# Patient Record
Sex: Female | Born: 1955 | Race: Black or African American | Hispanic: No | Smoking: Former smoker
Health system: Southern US, Community
[De-identification: ages and names within clinical notes are randomized; demographics above are authoritative.]

## PROBLEM LIST (undated history)

## (undated) DIAGNOSIS — D126 Benign neoplasm of colon, unspecified: Secondary | ICD-10-CM

## (undated) DIAGNOSIS — M12819 Other specific arthropathies, not elsewhere classified, unspecified shoulder: Secondary | ICD-10-CM

## (undated) DIAGNOSIS — R112 Nausea with vomiting, unspecified: Secondary | ICD-10-CM

## (undated) DIAGNOSIS — J302 Other seasonal allergic rhinitis: Secondary | ICD-10-CM

## (undated) DIAGNOSIS — E0500 Thyrotoxicosis with diffuse goiter without thyrotoxic crisis or storm: Secondary | ICD-10-CM

## (undated) DIAGNOSIS — T7840XA Allergy, unspecified, initial encounter: Secondary | ICD-10-CM

## (undated) DIAGNOSIS — M19019 Primary osteoarthritis, unspecified shoulder: Secondary | ICD-10-CM

## (undated) DIAGNOSIS — M75100 Unspecified rotator cuff tear or rupture of unspecified shoulder, not specified as traumatic: Secondary | ICD-10-CM

## (undated) DIAGNOSIS — J439 Emphysema, unspecified: Secondary | ICD-10-CM

## (undated) DIAGNOSIS — D249 Benign neoplasm of unspecified breast: Secondary | ICD-10-CM

## (undated) DIAGNOSIS — R001 Bradycardia, unspecified: Secondary | ICD-10-CM

## (undated) DIAGNOSIS — M1990 Unspecified osteoarthritis, unspecified site: Secondary | ICD-10-CM

## (undated) DIAGNOSIS — K5792 Diverticulitis of intestine, part unspecified, without perforation or abscess without bleeding: Secondary | ICD-10-CM

## (undated) HISTORY — PX: TUBAL LIGATION: SHX77

## (undated) HISTORY — PX: EYE SURGERY: SHX253

## (undated) HISTORY — DX: Other specific arthropathies, not elsewhere classified, unspecified shoulder: M12.819

## (undated) HISTORY — PX: SHOULDER ARTHROSCOPY W/ SUPERIOR LABRAL ANTERIOR POSTERIOR LESION REPAIR: SHX2402

## (undated) HISTORY — DX: Other seasonal allergic rhinitis: J30.2

## (undated) HISTORY — DX: Unspecified osteoarthritis, unspecified site: M19.90

## (undated) HISTORY — DX: Benign neoplasm of unspecified breast: D24.9

## (undated) HISTORY — DX: Emphysema, unspecified: J43.9

## (undated) HISTORY — PX: MULTIPLE TOOTH EXTRACTIONS: SHX2053

## (undated) HISTORY — DX: Diverticulitis of intestine, part unspecified, without perforation or abscess without bleeding: K57.92

## (undated) HISTORY — DX: Unspecified rotator cuff tear or rupture of unspecified shoulder, not specified as traumatic: M75.100

## (undated) HISTORY — DX: Primary osteoarthritis, unspecified shoulder: M19.019

## (undated) HISTORY — DX: Allergy, unspecified, initial encounter: T78.40XA

## (undated) HISTORY — PX: BREAST EXCISIONAL BIOPSY: SUR124

## (undated) HISTORY — DX: Benign neoplasm of colon, unspecified: D12.6

## (undated) HISTORY — PX: ROTATOR CUFF REPAIR: SHX139

## (undated) HISTORY — DX: Bradycardia, unspecified: R00.1

## (undated) HISTORY — PX: OTHER SURGICAL HISTORY: SHX169

---

## 1987-03-10 HISTORY — PX: CHOLECYSTECTOMY: SHX55

## 1987-03-10 HISTORY — PX: ABDOMINAL HYSTERECTOMY: SHX81

## 1997-09-22 ENCOUNTER — Emergency Department (HOSPITAL_COMMUNITY): Admission: EM | Admit: 1997-09-22 | Discharge: 1997-09-22 | Payer: Self-pay | Admitting: Emergency Medicine

## 1998-08-01 ENCOUNTER — Emergency Department (HOSPITAL_COMMUNITY): Admission: EM | Admit: 1998-08-01 | Discharge: 1998-08-01 | Payer: Self-pay

## 1999-07-17 ENCOUNTER — Encounter: Payer: Self-pay | Admitting: Family Medicine

## 1999-07-17 ENCOUNTER — Ambulatory Visit (HOSPITAL_COMMUNITY): Admission: RE | Admit: 1999-07-17 | Discharge: 1999-07-17 | Payer: Self-pay | Admitting: Family Medicine

## 2001-06-10 ENCOUNTER — Emergency Department (HOSPITAL_COMMUNITY): Admission: EM | Admit: 2001-06-10 | Discharge: 2001-06-10 | Payer: Self-pay | Admitting: Emergency Medicine

## 2003-11-06 ENCOUNTER — Emergency Department (HOSPITAL_COMMUNITY): Admission: EM | Admit: 2003-11-06 | Discharge: 2003-11-06 | Payer: Self-pay | Admitting: Emergency Medicine

## 2008-09-25 ENCOUNTER — Emergency Department (HOSPITAL_COMMUNITY): Admission: EM | Admit: 2008-09-25 | Discharge: 2008-09-25 | Payer: Self-pay | Admitting: Family Medicine

## 2008-11-20 ENCOUNTER — Other Ambulatory Visit: Admission: RE | Admit: 2008-11-20 | Discharge: 2008-11-20 | Payer: Self-pay | Admitting: Family Medicine

## 2008-11-26 ENCOUNTER — Encounter: Admission: RE | Admit: 2008-11-26 | Discharge: 2008-11-26 | Payer: Self-pay | Admitting: Internal Medicine

## 2008-12-03 ENCOUNTER — Encounter: Admission: RE | Admit: 2008-12-03 | Discharge: 2008-12-03 | Payer: Self-pay | Admitting: Internal Medicine

## 2010-03-09 DIAGNOSIS — K5792 Diverticulitis of intestine, part unspecified, without perforation or abscess without bleeding: Secondary | ICD-10-CM

## 2010-03-09 HISTORY — DX: Diverticulitis of intestine, part unspecified, without perforation or abscess without bleeding: K57.92

## 2011-04-20 ENCOUNTER — Emergency Department (INDEPENDENT_AMBULATORY_CARE_PROVIDER_SITE_OTHER)
Admission: EM | Admit: 2011-04-20 | Discharge: 2011-04-20 | Disposition: A | Discharge status: Home / Self Care | Payer: Self-pay | Source: Home / Self Care | Attending: Emergency Medicine | Admitting: Emergency Medicine

## 2011-04-20 ENCOUNTER — Emergency Department (HOSPITAL_COMMUNITY)
Admission: RE | Admit: 2011-04-20 | Discharge: 2011-04-20 | Disposition: A | Discharge status: Home / Self Care | Payer: Self-pay | Source: Intra-hospital | Attending: Emergency Medicine | Admitting: Emergency Medicine

## 2011-04-20 ENCOUNTER — Encounter (HOSPITAL_COMMUNITY): Payer: Self-pay | Admitting: *Deleted

## 2011-04-20 DIAGNOSIS — M949 Disorder of cartilage, unspecified: Secondary | ICD-10-CM | POA: Insufficient documentation

## 2011-04-20 DIAGNOSIS — M79609 Pain in unspecified limb: Secondary | ICD-10-CM | POA: Insufficient documentation

## 2011-04-20 DIAGNOSIS — G5760 Lesion of plantar nerve, unspecified lower limb: Secondary | ICD-10-CM

## 2011-04-20 DIAGNOSIS — M899 Disorder of bone, unspecified: Secondary | ICD-10-CM | POA: Insufficient documentation

## 2011-04-20 MED ORDER — METHYLPREDNISOLONE ACETATE 40 MG/ML IJ SUSP
INTRAMUSCULAR | Status: AC
  Filled 2011-04-20: qty 5

## 2011-04-20 NOTE — ED Provider Notes (Signed)
Chief Complaint  Patient presents with  . Toe Pain    History of Present Illness:  Mrs. Zuba has had a six-month history of pain in the left second and third toes. This seems to radiate towards the web space between the 2 toes. She has swelling at the tips of the toes. The pain is worse if she wears high heels or stockings. There is some numbness in the toes. There is no redness or inflammation. She denies any trauma to the area. She does have a history of osteopenia and is supposed to be taing calcium and vitamin D, but she's not taking it. She denies any pain in the right foot, muscle weakness, ankle swelling, or any other joint pain.  Review of Systems:  Other than noted above, the patient denies any of the following symptoms: Systemic:  No fevers, chills, sweats, or aches.  No fatigue or tiredness. Musculoskeletal:  No joint pain, arthritis, bursitis, swelling, back pain, or neck pain. Neurological:  No muscular weakness, paresthesias, headache, or trouble with speech or coordination.  No dizziness.   PMFSH:  Past medical history, family history, social history, meds, and allergies were reviewed.  Physical Exam:   Vital signs:  BP 122/85  Pulse 60  Temp 98.2 F (36.8 C)  Resp 18  SpO2 100% Gen:  Alert and oriented times 3.  In no distress. Musculoskeletal: Exam of the foot reveals no swelling, redness, or tenderness to palpation over any of the toes is tenderness to palpation the webspace between the second and third toes, but no swelling of the foot. Otherwise, all joints had a full a ROM with no swelling, bruising or deformity.  No edema, pulses full. Extremities were warm and pink.  Capillary refill was brisk.  Skin:  Clear, warm and dry.  No rash. Neuro:  Alert and oriented times 3.  Muscle strength was normal.  Sensation was intact to light touch.   Labs:  No results found for this or any previous visit.   Radiology:  Dg Foot Complete Left  04/20/2011  *RADIOLOGY REPORT*   Clinical Data: Intermittent pain in the left second and third toes for the past 6 months.  No known injury.  LEFT FOOT - COMPLETE 3+ VIEW  Comparison: None.  Findings: Diffuse osteopenia.  No fracture, dislocation or periosteal reaction seen.  Minimal calcaneal spur formation.  IMPRESSION: Diffuse osteopenia.  No acute abnormality.  Original Report Authenticated By: Darrol Angel, M.D.    Course in Urgent Care Center:   Written informed consent was obtained, the identity of the patient was confirmed verbally and by checking the wrist band, and the webspace between the second and third toes at the level of the metatarsal heads was injected as follows: The area was prepped with Betadine and alcohol and anesthetized with chloride spray. The area was injected with 1 cc Depo-Medrol 40 mg strength and 1 cc of 2% Xylocaine without epinephrine using a 29-gauge 1/2 inch needle. The patient tolerated the procedure well without any immediate complications. She was given aftercare instructions.   Assessment:   Diagnoses that have been ruled out:  None  Diagnoses that are still under consideration:  None  Final diagnoses:  Morton's neuroma    Plan:   1.  The following meds were prescribed:   New Prescriptions   No medications on file   2.  The patient was instructed in symptomatic care, including rest and activity, elevation, application of ice and compression.  Appropriate handouts were  given. 3.  The patient was told to return if becoming worse in any way, if no better in 3 or 4 days, and given some red flag symptoms that would indicate earlier return.   4.  The patient was told to follow up with Dr. Cristie Hem if no improvement in one week.   Roque Lias, MD 04/20/11 1110

## 2011-04-20 NOTE — ED Notes (Signed)
Pt is here with complaints of 6 month history of left foot second and third toe pain.  Pt states pain is worse when wearing heels and pantyhose.

## 2011-09-22 ENCOUNTER — Other Ambulatory Visit: Payer: Self-pay | Admitting: Family Medicine

## 2011-09-22 DIAGNOSIS — N63 Unspecified lump in unspecified breast: Secondary | ICD-10-CM

## 2011-09-25 ENCOUNTER — Ambulatory Visit
Admission: RE | Admit: 2011-09-25 | Discharge: 2011-09-25 | Disposition: A | Discharge status: Home / Self Care | Payer: No Typology Code available for payment source | Source: Ambulatory Visit | Attending: Family Medicine | Admitting: Family Medicine

## 2011-09-25 DIAGNOSIS — N63 Unspecified lump in unspecified breast: Secondary | ICD-10-CM

## 2011-10-15 ENCOUNTER — Other Ambulatory Visit: Payer: Self-pay | Admitting: Family Medicine

## 2011-10-15 DIAGNOSIS — N63 Unspecified lump in unspecified breast: Secondary | ICD-10-CM

## 2011-10-22 ENCOUNTER — Ambulatory Visit
Admission: RE | Admit: 2011-10-22 | Discharge: 2011-10-22 | Disposition: A | Discharge status: Home / Self Care | Payer: No Typology Code available for payment source | Source: Ambulatory Visit | Attending: Family Medicine | Admitting: Family Medicine

## 2011-10-22 ENCOUNTER — Other Ambulatory Visit: Payer: Self-pay | Admitting: Family Medicine

## 2011-10-22 DIAGNOSIS — N63 Unspecified lump in unspecified breast: Secondary | ICD-10-CM

## 2011-11-11 ENCOUNTER — Encounter (HOSPITAL_COMMUNITY): Payer: Self-pay | Admitting: Emergency Medicine

## 2011-11-11 ENCOUNTER — Emergency Department (HOSPITAL_COMMUNITY): Payer: Self-pay

## 2011-11-11 ENCOUNTER — Emergency Department (HOSPITAL_COMMUNITY)
Admission: EM | Admit: 2011-11-11 | Discharge: 2011-11-11 | Disposition: A | Discharge status: Home / Self Care | Payer: Self-pay | Attending: Emergency Medicine | Admitting: Emergency Medicine

## 2011-11-11 DIAGNOSIS — R001 Bradycardia, unspecified: Secondary | ICD-10-CM

## 2011-11-11 DIAGNOSIS — Z79899 Other long term (current) drug therapy: Secondary | ICD-10-CM | POA: Insufficient documentation

## 2011-11-11 DIAGNOSIS — I498 Other specified cardiac arrhythmias: Secondary | ICD-10-CM | POA: Insufficient documentation

## 2011-11-11 DIAGNOSIS — E0500 Thyrotoxicosis with diffuse goiter without thyrotoxic crisis or storm: Secondary | ICD-10-CM | POA: Insufficient documentation

## 2011-11-11 DIAGNOSIS — R079 Chest pain, unspecified: Secondary | ICD-10-CM | POA: Insufficient documentation

## 2011-11-11 DIAGNOSIS — R002 Palpitations: Secondary | ICD-10-CM | POA: Insufficient documentation

## 2011-11-11 DIAGNOSIS — F17200 Nicotine dependence, unspecified, uncomplicated: Secondary | ICD-10-CM | POA: Insufficient documentation

## 2011-11-11 DIAGNOSIS — R42 Dizziness and giddiness: Secondary | ICD-10-CM | POA: Insufficient documentation

## 2011-11-11 HISTORY — DX: Thyrotoxicosis with diffuse goiter without thyrotoxic crisis or storm: E05.00

## 2011-11-11 LAB — CBC WITH DIFFERENTIAL/PLATELET
Basophils Relative: 0 % (ref 0–1)
Eosinophils Absolute: 0.1 10*3/uL (ref 0.0–0.7)
MCH: 30 pg (ref 26.0–34.0)
MCHC: 33.8 g/dL (ref 30.0–36.0)
Neutro Abs: 2.5 10*3/uL (ref 1.7–7.7)
Neutrophils Relative %: 40 % — ABNORMAL LOW (ref 43–77)
Platelets: 265 10*3/uL (ref 150–400)
RBC: 4.06 MIL/uL (ref 3.87–5.11)

## 2011-11-11 LAB — POCT I-STAT TROPONIN I
Troponin i, poc: 0 ng/mL (ref 0.00–0.08)
Troponin i, poc: 0 ng/mL (ref 0.00–0.08)

## 2011-11-11 LAB — BASIC METABOLIC PANEL
Chloride: 109 mEq/L (ref 96–112)
GFR calc Af Amer: >90 mL/min (ref >90)
GFR calc non Af Amer: >90 mL/min (ref >90)
Potassium: 3.8 mEq/L (ref 3.5–5.1)
Sodium: 146 mEq/L — ABNORMAL HIGH (ref 135–145)

## 2011-11-11 MED ORDER — SODIUM CHLORIDE 0.9 % IV BOLUS (SEPSIS): 1000.0000 mL | Freq: Once | INTRAVENOUS | Status: DC

## 2011-11-11 NOTE — ED Notes (Signed)
Pt ambulated to restroom. 

## 2011-11-11 NOTE — ED Notes (Signed)
No needs at present,a waiting blood results to come back.

## 2011-11-11 NOTE — ED Provider Notes (Addendum)
History     CSN: [id number]  Arrival date & time 11/11/11  0129   First MD Initiated Contact with Patient 11/11/11 [phone]      Chief Complaint  Patient presents with  . Chest Pain    (Consider location/radiation/quality/duration/timing/severity/associated sxs/prior treatment) Patient is a 56 y.o. female presenting with chest pain. The history is provided by the patient.  Chest Pain Primary symptoms include palpitations. Pertinent negatives for primary symptoms include no fever, no shortness of breath, no cough, no abdominal pain, no nausea and no vomiting.  The palpitations did not occur with shortness of breath.   Pertinent negatives for associated symptoms include no diaphoresis.    56 year old, female who smokes cigarettes and has a history of Graves' disease, presents to emergency department complaining of chest pain, with lightheadedness, and palpitations of her heart.  She denies nausea, vomiting, sweating, or shortness of breath.  Presently, she is asymptomatic.  She denies cough, fevers, chills, leg pain or swelling.  She has never had similar symptoms in the past.  Past Medical History  Diagnosis Date  . Graves disease     Past Surgical History  Procedure Date  . Shoulder arthroscopy w/ superior labral anterior posterior lesion repair right  . Abdominal hysterectomy   . Cholecystectomy   . Rotator cuff repair     No family history on file.  History  Substance Use Topics  . Smoking status: Current Everyday Smoker -- 0.5 packs/day  . Smokeless tobacco: Not on file  . Alcohol Use: No    OB History    Grav Para Term Preterm Abortions TAB SAB Ect Mult Living                  Review of Systems  Constitutional: Negative for fever, chills and diaphoresis.  HENT: Negative for neck pain.   Respiratory: Negative for cough and shortness of breath.   Cardiovascular: Positive for chest pain and palpitations. Negative for leg swelling.  Gastrointestinal: Negative for  nausea, vomiting and abdominal pain.  Musculoskeletal: Negative for back pain.  Neurological: Positive for light-headedness.  Hematological: Does not bruise/bleed easily.  Psychiatric/Behavioral: Negative for confusion.  All other systems reviewed and are negative.    Allergies  Sulfur  Home Medications   Current Outpatient Rx  Name Route Sig Dispense Refill  . CALCITRATE PO Oral Take 1 tablet by mouth daily.    Marland Kitchen CETIRIZINE HCL 10 MG PO TABS Oral Take 10 mg by mouth daily as needed. For allergies    . VITAMIN D 1000 UNITS PO TABS Oral Take 1,000 Units by mouth daily.    Marland Kitchen DICLOFENAC SODIUM 1 % TD GEL Topical Apply 2 g topically daily as needed. For shoulder pain per patient    . FLUTICASONE PROPIONATE 50 MCG/ACT NA SUSP Nasal Place 1 spray into the nose 2 (two) times daily as needed. For allergies    . CENTRUM SILVER PO Oral Take 1 tablet by mouth daily.      BP 122/68  Pulse 50  Temp 98.1 F (36.7 C) (Oral)  Resp 19  SpO2 100%  Physical Exam  Nursing note and vitals reviewed. Constitutional: She is oriented to person, place, and time. She appears well-developed and well-nourished. No distress.  HENT:  Head: Normocephalic and atraumatic.  Eyes: Conjunctivae and EOM are normal. No scleral icterus.  Neck: Normal range of motion. Neck supple.  Cardiovascular: Regular rhythm and intact distal pulses.   No murmur heard.      Bradycardia  Pulmonary/Chest: Effort normal and breath sounds normal. No respiratory distress. She has no rales.  Abdominal: Soft. Bowel sounds are normal. She exhibits no distension. There is no tenderness.  Musculoskeletal: Normal range of motion. She exhibits no edema and no tenderness.  Neurological: She is alert and oriented to person, place, and time.  Skin: Skin is warm and dry.  Psychiatric: She has a normal mood and affect. Thought content normal.    ED Course  Procedures (including critical care time) 56 year old, smoker with Graves'  disease, presents emergency department complaining of chest pain, lightheadedness, and palpitations of her heart.  She denies cough, fevers, chills, sweating, shortness of breath, nausea, and vomiting.  On, examination.  She is asymptomatic now and her physical is normal except for the bradycardic rhythm.  I doubt that she has ACS, but we will perform an EKG, and laboratory testing, for evaluation  Labs Reviewed  CBC WITH DIFFERENTIAL - Abnormal; Notable for the following:    Neutrophils Relative 40 (*)     Lymphocytes Relative 50 (*)     All other components within normal limits  BASIC METABOLIC PANEL - Abnormal; Notable for the following:    Sodium 146 (*)     All other components within normal limits  POCT I-STAT TROPONIN I   Dg Chest 2 View  11/11/2011  *RADIOLOGY REPORT*  Clinical Data: Chest heaviness, shortness of breath, history Graves' disease  CHEST - 2 VIEW  Comparison: None  Findings: Normal heart size, mediastinal contours, and pulmonary vascularity. Lungs appear mildly emphysematous but clear. No pleural effusion or pneumothorax. Minimal peribronchial thickening. Question mild osseous demineralization.  IMPRESSION: Mild emphysematous and bronchitic changes question COPD. No acute abnormalities.   Original Report Authenticated By: Lollie Marrow, M.D.      No diagnosis found.  ECG. Sinus bradycardia at 47 beats per minute. Normal axis. Normal intervals  Normal.  ST and T waves   HR 39-40 on monitor.  Will cancel dc and ambulate pt  5:40 AM I spoke with Dr. Terressa Koyanagi, cards. We discussed H&P, ecg, labs, hr and paucity of sxs when ambulatory.  He says she can go home to f/u with pcp for possible holter.   i explained this to pt.  She understands and agrees with plan.   MDM  Sinus bradycardia         Cheri Guppy, MD 11/11/11 [id number]  Cheri Guppy, MD 11/11/11 8469  Cheri Guppy, MD 11/11/11 [phone]

## 2011-11-11 NOTE — ED Notes (Signed)
PT. REPORTS MID CHEST PAIN WITH SOB AND NAUSEA ONSET THIS EVENING , DENIS COUGH OR DIAPHORESIS.

## 2011-12-10 ENCOUNTER — Ambulatory Visit (INDEPENDENT_AMBULATORY_CARE_PROVIDER_SITE_OTHER): Payer: Self-pay | Admitting: General Surgery

## 2011-12-10 ENCOUNTER — Encounter (INDEPENDENT_AMBULATORY_CARE_PROVIDER_SITE_OTHER): Payer: Self-pay | Admitting: General Surgery

## 2011-12-10 VITALS — BP 94/48 | HR 56 | Temp 97.3°F | Resp 16 | Ht 64.0 in | Wt 126.4 lb

## 2011-12-10 DIAGNOSIS — N6310 Unspecified lump in the right breast, unspecified quadrant: Secondary | ICD-10-CM

## 2011-12-10 DIAGNOSIS — N63 Unspecified lump in unspecified breast: Secondary | ICD-10-CM

## 2011-12-10 NOTE — Addendum Note (Signed)
Addended by: Axel Filler on: 12/10/2011 10:03 AM   Modules accepted: Orders

## 2011-12-10 NOTE — Progress Notes (Signed)
Subjective:     Patient ID: Brandi Stevens, female   DOB: 05/29/1955, 56 y.o.   MRN: [id number]  HPI This patient is a 56-year-old female who is referred by Dr. Thacker for a right lateral breast mass. 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 The patient secondary this mass has been growing over the last several months like to have this removed at this time. The patient's tissues noticed no drainage from the nipple on the right side, no lymphadenopathy.  Review of Systems  Constitutional: Negative.   HENT: Negative.   Eyes: Negative.   Respiratory: Negative.   Cardiovascular: Negative.   Gastrointestinal: Negative.   Musculoskeletal: Negative.   Neurological: Negative.        Objective:   Physical Exam  Constitutional: She is oriented to person, place, and time. She appears well-developed and well-nourished.  HENT:  Head: Normocephalic and atraumatic.  Eyes: Conjunctivae normal and EOM are normal. Pupils are equal, round, and reactive to light.  Neck: Normal range of motion. Neck supple.  Cardiovascular: Normal rate and regular rhythm.   Pulmonary/Chest: Effort normal and breath sounds normal.         A small 1 cm lesion in the lateral chest wall approximately the 10:00 position.  Abdominal: Soft. Bowel sounds are normal.  Musculoskeletal: Normal range of motion.  Neurological: She is alert and oriented to person, place, and time.  Skin: Skin is warm and dry.       Assessment:     56-year-old female with a right lateral breast mass.    Plan:     1. We'll proceed to the operating room for removal of her right lower breast mass. 2. All risks and benefits were discussed with the patient, to generally include infection, bleeding, damage to surrounding structures, and recurrence. Alternatives were offered and described.  All questions were answered and the patient voiced understanding of the procedure and wishes to proceed at this  point.       

## 2011-12-11 ENCOUNTER — Ambulatory Visit (HOSPITAL_COMMUNITY)
Admission: RE | Admit: 2011-12-11 | Discharge: 2011-12-11 | Disposition: A | Discharge status: Home / Self Care | Payer: Self-pay | Source: Ambulatory Visit | Attending: Interventional Cardiology | Admitting: Interventional Cardiology

## 2011-12-11 DIAGNOSIS — I498 Other specified cardiac arrhythmias: Secondary | ICD-10-CM | POA: Insufficient documentation

## 2011-12-11 DIAGNOSIS — R0789 Other chest pain: Secondary | ICD-10-CM

## 2011-12-11 DIAGNOSIS — Z1389 Encounter for screening for other disorder: Secondary | ICD-10-CM | POA: Insufficient documentation

## 2011-12-14 ENCOUNTER — Encounter (HOSPITAL_COMMUNITY): Payer: Self-pay | Admitting: Pharmacy Technician

## 2011-12-15 NOTE — Pre-Procedure Instructions (Signed)
21 Brandi Stevens  12/15/2011   Your procedure is scheduled on:  Tuesday, October 15th.  Report to [facility] at 10:15 AM.  Call this number if you have problems the morning of surgery: [phone]   Remember:   Do not eat food or drink any liquid:After Midnight.     Take these medicines the morning of surgery with A SIP OF WATER: Cetirizine (Zyretec) and Tramadol (Ultram) if needed.    Do not wear jewelry, make-up or nail polish.  Do not wear lotions, powders, or perfumes. You may wear deodorant.  Do not shave 48 hours prior to surgery. Men may shave face and neck.  Do not bring valuables to the hospital.  Contacts, dentures or bridgework may not be worn into surgery.  Leave suitcase in the car. After surgery it may be brought to your room.  For patients admitted to the hospital, checkout time is 11:00 AM the day of discharge.   Patients discharged the day of surgery will not be allowed to drive home.  Name and phone number of your driver: __________________     Special Instructions: Shower using CHG 2 nights before surgery and the night before surgery.  If you shower the day of surgery use CHG.  Use special wash - you have one bottle of CHG for all showers.  You should use approximately 1/3 of the bottle for each shower.   Please read over the following fact sheets that you were given: Pain Booklet, Coughing and Deep Breathing and Surgical Site Infection Prevention

## 2011-12-16 ENCOUNTER — Encounter (HOSPITAL_COMMUNITY)
Admission: RE | Admit: 2011-12-16 | Discharge: 2011-12-16 | Disposition: A | Discharge status: Home / Self Care | Payer: Self-pay | Source: Ambulatory Visit | Attending: General Surgery | Admitting: General Surgery

## 2011-12-16 ENCOUNTER — Encounter (HOSPITAL_COMMUNITY): Payer: Self-pay

## 2011-12-16 HISTORY — DX: Nausea with vomiting, unspecified: R11.2

## 2011-12-16 LAB — CBC
HCT: 39.6 % (ref 36.0–46.0)
Hemoglobin: 13.1 g/dL (ref 12.0–15.0)
MCHC: 33.1 g/dL (ref 30.0–36.0)
RDW: 13.5 % (ref 11.5–15.5)
WBC: 4.1 10*3/uL (ref 4.0–10.5)

## 2011-12-16 LAB — BASIC METABOLIC PANEL
CO2: 27 mEq/L (ref 19–32)
Calcium: 9.9 mg/dL (ref 8.4–10.5)
Glucose, Bld: 86 mg/dL (ref 70–99)
Potassium: 4.6 mEq/L (ref 3.5–5.1)
Sodium: 143 mEq/L (ref 135–145)

## 2011-12-16 LAB — SURGICAL PCR SCREEN
MRSA, PCR: NEGATIVE
Staphylococcus aureus: NEGATIVE

## 2011-12-16 NOTE — Progress Notes (Signed)
Dr Mendel Ryder office called for holter monitor results.  strress test in epic,normal.

## 2011-12-17 NOTE — Consult Note (Addendum)
Anesthesia Chart Review:  Patient is a 56 year old female scheduled for excision of a right breast mass by Dr. Derrell Lolling on 12/22/2011. History includes former smoker, Graves' disease, asthma, arthritis, postoperative nausea/vomiting, bradycardia.    Labs noted.  Chest x-ray on 11/11/2011 showed mild emphysematous and bronchitic changes question COPD. No acute abnormalities.  EKG on 11/11/11 showed sinus bradycardia at 47 beats per minute.  She had a normal exercise treadmill stress test on 12/11/11.  Holter monitor test read on 12/16/11 by Dr. Verdis Prime showed NSR is basic rhythm, occasional PACs, heart rate range 40-178 per minute, no rate or tachycardia arrhythmias noted. Overall normal study.  Anticipate she can proceed as planned if no significant change in her status and remains asymptomatic from a CV/bradycardia standpoint.  Shonna Chock, PA-C  Addendum: 12/22/11 1720 I received additional Cardiology records following the above note and reviewed these with Anesthesiologist Dr. Michelle Piper.  Patient was seen by Dr. Katrinka Blazing due to recurrent syncope.  I left a message with him on 12/18/11 re: planned procedure.  Today I received a note from Cardiologist Dr. Katrinka Blazing that patient was cleared for this procedure.

## 2011-12-21 MED ORDER — CEFAZOLIN SODIUM-DEXTROSE 2-3 GM-% IV SOLR
2.0000 g | INTRAVENOUS | Status: AC
  Administered 2011-12-22: 2 g via INTRAVENOUS
  Filled 2011-12-21: qty 50

## 2011-12-22 ENCOUNTER — Ambulatory Visit (HOSPITAL_COMMUNITY): Payer: Self-pay | Admitting: Vascular Surgery

## 2011-12-22 ENCOUNTER — Encounter (HOSPITAL_COMMUNITY): Payer: Self-pay | Admitting: *Deleted

## 2011-12-22 ENCOUNTER — Encounter (HOSPITAL_COMMUNITY): Payer: Self-pay | Admitting: Vascular Surgery

## 2011-12-22 ENCOUNTER — Encounter (HOSPITAL_COMMUNITY)
Admission: RE | Disposition: A | Discharge status: Home / Self Care | Payer: Self-pay | Source: Ambulatory Visit | Attending: General Surgery

## 2011-12-22 ENCOUNTER — Ambulatory Visit (HOSPITAL_COMMUNITY)
Admission: RE | Admit: 2011-12-22 | Discharge: 2011-12-22 | Disposition: A | Discharge status: Home / Self Care | Payer: Self-pay | Source: Ambulatory Visit | Attending: General Surgery | Admitting: General Surgery

## 2011-12-22 DIAGNOSIS — D249 Benign neoplasm of unspecified breast: Secondary | ICD-10-CM | POA: Insufficient documentation

## 2011-12-22 DIAGNOSIS — Z01812 Encounter for preprocedural laboratory examination: Secondary | ICD-10-CM | POA: Insufficient documentation

## 2011-12-22 HISTORY — PX: BREAST BIOPSY: SHX20

## 2011-12-22 SURGERY — BREAST BIOPSY
Anesthesia: General | Site: Breast | Laterality: Right | Wound class: Clean

## 2011-12-22 MED ORDER — ONDANSETRON HCL 4 MG/2ML IJ SOLN: 4.0000 mg | Freq: Once | INTRAMUSCULAR | Status: DC | PRN

## 2011-12-22 MED ORDER — BUPIVACAINE HCL (PF) 0.25 % IJ SOLN
INTRAMUSCULAR | Status: AC
  Filled 2011-12-22: qty 30

## 2011-12-22 MED ORDER — LACTATED RINGERS IV SOLN
INTRAVENOUS | Status: DC | PRN
  Administered 2011-12-22: 12:00:00 via INTRAVENOUS

## 2011-12-22 MED ORDER — LACTATED RINGERS IV SOLN
INTRAVENOUS | Status: DC
  Administered 2011-12-22: 12:00:00 via INTRAVENOUS

## 2011-12-22 MED ORDER — PROPOFOL 10 MG/ML IV BOLUS
INTRAVENOUS | Status: DC | PRN
  Administered 2011-12-22: 140 mg via INTRAVENOUS

## 2011-12-22 MED ORDER — HYDROMORPHONE HCL PF 1 MG/ML IJ SOLN: 0.2500 mg | INTRAMUSCULAR | Status: DC | PRN

## 2011-12-22 MED ORDER — HYDROCODONE-ACETAMINOPHEN 5-500 MG PO TABS: 1.0000 | ORAL_TABLET | Freq: Four times a day (QID) | ORAL | Status: DC | PRN

## 2011-12-22 MED ORDER — LIDOCAINE HCL (PF) 1 % IJ SOLN
INTRAMUSCULAR | Status: AC
  Filled 2011-12-22: qty 30

## 2011-12-22 MED ORDER — MEPERIDINE HCL 25 MG/ML IJ SOLN: 6.2500 mg | INTRAMUSCULAR | Status: DC | PRN

## 2011-12-22 MED ORDER — 0.9 % SODIUM CHLORIDE (POUR BTL) OPTIME
TOPICAL | Status: DC | PRN
  Administered 2011-12-22: 1000 mL

## 2011-12-22 MED ORDER — FENTANYL CITRATE 0.05 MG/ML IJ SOLN
INTRAMUSCULAR | Status: DC | PRN
  Administered 2011-12-22: 50 ug via INTRAVENOUS

## 2011-12-22 MED ORDER — OXYCODONE HCL 5 MG PO TABS: 5.0000 mg | ORAL_TABLET | Freq: Once | ORAL | Status: DC | PRN

## 2011-12-22 MED ORDER — ONDANSETRON HCL 4 MG/2ML IJ SOLN
INTRAMUSCULAR | Status: DC | PRN
  Administered 2011-12-22: 4 mg via INTRAVENOUS

## 2011-12-22 MED ORDER — BUPIVACAINE HCL (PF) 0.25 % IJ SOLN
INTRAMUSCULAR | Status: DC | PRN
  Administered 2011-12-22: 30 mL

## 2011-12-22 MED ORDER — LIDOCAINE-EPINEPHRINE (PF) 1 %-1:200000 IJ SOLN
INTRAMUSCULAR | Status: AC
  Filled 2011-12-22: qty 10

## 2011-12-22 MED ORDER — MIDAZOLAM HCL 5 MG/5ML IJ SOLN
INTRAMUSCULAR | Status: DC | PRN
  Administered 2011-12-22: 2 mg via INTRAVENOUS

## 2011-12-22 MED ORDER — OXYCODONE HCL 5 MG/5ML PO SOLN: 5.0000 mg | Freq: Once | ORAL | Status: DC | PRN

## 2011-12-22 SURGICAL SUPPLY — 43 items
BINDER BREAST LRG (GAUZE/BANDAGES/DRESSINGS) IMPLANT
BINDER BREAST XLRG (GAUZE/BANDAGES/DRESSINGS) IMPLANT
BLADE SURG 10 STRL SS (BLADE) ×2 IMPLANT
BLADE SURG 15 STRL LF DISP TIS (BLADE) ×1 IMPLANT
BLADE SURG 15 STRL SS (BLADE) ×1
CHLORAPREP W/TINT 26ML (MISCELLANEOUS) ×2 IMPLANT
CLOTH BEACON ORANGE TIMEOUT ST (SAFETY) ×2 IMPLANT
COVER SURGICAL LIGHT HANDLE (MISCELLANEOUS) ×2 IMPLANT
DECANTER SPIKE VIAL GLASS SM (MISCELLANEOUS) ×2 IMPLANT
DERMABOND ADVANCED (GAUZE/BANDAGES/DRESSINGS) ×1
DERMABOND ADVANCED .7 DNX12 (GAUZE/BANDAGES/DRESSINGS) ×1 IMPLANT
DRAPE PED LAPAROTOMY (DRAPES) ×2 IMPLANT
ELECT CAUTERY BLADE 6.4 (BLADE) ×2 IMPLANT
ELECT REM PT RETURN 9FT ADLT (ELECTROSURGICAL) ×2
ELECTRODE REM PT RTRN 9FT ADLT (ELECTROSURGICAL) ×1 IMPLANT
GLOVE BIO SURGEON STRL SZ 6.5 (GLOVE) ×2 IMPLANT
GLOVE BIO SURGEON STRL SZ7 (GLOVE) ×2 IMPLANT
GLOVE BIOGEL PI IND STRL 6.5 (GLOVE) ×1 IMPLANT
GLOVE BIOGEL PI IND STRL 7.0 (GLOVE) ×1 IMPLANT
GLOVE BIOGEL PI IND STRL 7.5 (GLOVE) ×1 IMPLANT
GLOVE BIOGEL PI INDICATOR 6.5 (GLOVE) ×1
GLOVE BIOGEL PI INDICATOR 7.0 (GLOVE) ×1
GLOVE BIOGEL PI INDICATOR 7.5 (GLOVE) ×1
GLOVE ORTHOPEDIC STR SZ6.5 (GLOVE) ×2 IMPLANT
GOWN STRL NON-REIN LRG LVL3 (GOWN DISPOSABLE) ×6 IMPLANT
KIT BASIN OR (CUSTOM PROCEDURE TRAY) ×2 IMPLANT
KIT ROOM TURNOVER OR (KITS) ×2 IMPLANT
NEEDLE HYPO 25GX1X1/2 BEV (NEEDLE) ×2 IMPLANT
NS IRRIG 1000ML POUR BTL (IV SOLUTION) ×2 IMPLANT
PACK SURGICAL SETUP 50X90 (CUSTOM PROCEDURE TRAY) ×2 IMPLANT
PAD ARMBOARD 7.5X6 YLW CONV (MISCELLANEOUS) ×2 IMPLANT
PENCIL BUTTON HOLSTER BLD 10FT (ELECTRODE) ×2 IMPLANT
SPONGE LAP 4X18 X RAY DECT (DISPOSABLE) ×2 IMPLANT
SUT MNCRL AB 4-0 PS2 18 (SUTURE) ×2 IMPLANT
SUT SILK 3 0 SH 30 (SUTURE) IMPLANT
SUT VIC AB 3-0 SH 27 (SUTURE) ×1
SUT VIC AB 3-0 SH 27XBRD (SUTURE) ×1 IMPLANT
SYR BULB 3OZ (MISCELLANEOUS) ×2 IMPLANT
SYR CONTROL 10ML LL (SYRINGE) ×2 IMPLANT
TOWEL OR 17X24 6PK STRL BLUE (TOWEL DISPOSABLE) ×2 IMPLANT
TOWEL OR 17X26 10 PK STRL BLUE (TOWEL DISPOSABLE) ×2 IMPLANT
TUBE CONNECTING 12X1/4 (SUCTIONS) IMPLANT
YANKAUER SUCT BULB TIP NO VENT (SUCTIONS) ×2 IMPLANT

## 2011-12-22 NOTE — H&P (View-Only) (Signed)
Subjective:     Patient ID: Brandi Stevens, female   DOB: 1955-06-24, 56 y.o.   MRN: [id number]  HPI This patient is a 56 year old female who is referred by Dr. Abigail Miyamoto for a right lateral breast mass. 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 The patient secondary this mass has been growing over the last several months like to have this removed at this time. The patient's tissues noticed no drainage from the nipple on the right side, no lymphadenopathy.  Review of Systems  Constitutional: Negative.   HENT: Negative.   Eyes: Negative.   Respiratory: Negative.   Cardiovascular: Negative.   Gastrointestinal: Negative.   Musculoskeletal: Negative.   Neurological: Negative.        Objective:   Physical Exam  Constitutional: She is oriented to person, place, and time. She appears well-developed and well-nourished.  HENT:  Head: Normocephalic and atraumatic.  Eyes: Conjunctivae normal and EOM are normal. Pupils are equal, round, and reactive to light.  Neck: Normal range of motion. Neck supple.  Cardiovascular: Normal rate and regular rhythm.   Pulmonary/Chest: Effort normal and breath sounds normal.         A small 1 cm lesion in the lateral chest wall approximately the 10:00 position.  Abdominal: Soft. Bowel sounds are normal.  Musculoskeletal: Normal range of motion.  Neurological: She is alert and oriented to person, place, and time.  Skin: Skin is warm and dry.       Assessment:     56 year old female with a right lateral breast mass.    Plan:     1. We'll proceed to the operating room for removal of her right lower breast mass. 2. All risks and benefits were discussed with the patient, to generally include infection, bleeding, damage to surrounding structures, and recurrence. Alternatives were offered and described.  All questions were answered and the patient voiced understanding of the procedure and wishes to proceed at this  point.

## 2011-12-22 NOTE — Op Note (Signed)
Pre Operative Diagnosis:  Right breast mass  Post Operative Diagnosis: same  Procedure: Right breast mass excision  Surgeon: Dr. Axel Filler  Assistant: Dr. Jamey Ripa  Anesthesia: GETA  EBL: less than 5 cc  Complications: none  Counts: reported as correct x 2  Findings:  The patient had a proximally 1 x 1 cm mobile mass in the right lateral aspect of her breast.  Indications for procedure:  The patient is a 56 year old female who discovered a right lateral breast mass. Patient underwent needle biopsy which revealed fibrous tissue. Secondary to patient's concern and one half was removed patient have this electively removed.  Details of the procedure: after the patient was consented patient was taken back to the operating room. Patient was then placed in the supine position bilateral SCDs in place. After appropriate antibiotics were confirmed, a time out was called and all facts were verified.  Marcaine was injected just intradermally. A 2 cm incision is made in the right lateral aspect of the breast approximately at the 10:00 position. Bovie cautery was used to maintain hemostasis and dissection was carried down to the mass. The mass was grasped with an Allis hemostat and then the mass was excised circumferentially. Once it was excised it was sent off to pathology. The area was checked for hemostasis which was excellent this portion of the case. A 3-0 Vicryl suture was used in interrupted fashion to reapproximate the dermal layer. The skin was reapproximated using a 4-0 Monocryl in a subcuticular fashion.  He was then dressed with Dermabond the patient was awakened from anesthesia was taken to recovery in stable condition and

## 2011-12-22 NOTE — Anesthesia Procedure Notes (Signed)
Procedure Name: LMA Insertion Date/Time: 12/22/2011 12:15 PM Performed by: Margaree Mackintosh Pre-anesthesia Checklist: Patient identified, Emergency Drugs available, Suction available, Patient being monitored and Timeout performed Patient Re-evaluated:Patient Re-evaluated prior to inductionOxygen Delivery Method: Circle system utilized Preoxygenation: Pre-oxygenation with 100% oxygen Intubation Type: IV induction LMA: LMA inserted LMA Size: 4.0 Placement Confirmation: positive ETCO2 and breath sounds checked- equal and bilateral Tube secured with: Tape Dental Injury: Teeth and Oropharynx as per pre-operative assessment

## 2011-12-22 NOTE — Anesthesia Preprocedure Evaluation (Signed)
Anesthesia Evaluation  Patient identified by MRN, date of birth, ID band Patient awake    History of Anesthesia Complications (+) PONV  Airway Mallampati: I TM Distance: >3 FB Neck ROM: Full    Dental   Pulmonary          Cardiovascular + dysrhythmias     Neuro/Psych    GI/Hepatic   Endo/Other    Renal/GU      Musculoskeletal   Abdominal   Peds  Hematology   Anesthesia Other Findings   Reproductive/Obstetrics                           Anesthesia Physical Anesthesia Plan  ASA: II  Anesthesia Plan: General   Post-op Pain Management:    Induction: Intravenous  Airway Management Planned: LMA  Additional Equipment:   Intra-op Plan:   Post-operative Plan: Extubation in OR  Informed Consent: I have reviewed the patients History and Physical, chart, labs and discussed the procedure including the risks, benefits and alternatives for the proposed anesthesia with the patient or authorized representative who has indicated his/her understanding and acceptance.     Plan Discussed with: CRNA and Surgeon  Anesthesia Plan Comments:         Anesthesia Quick Evaluation

## 2011-12-22 NOTE — Transfer of Care (Signed)
Immediate Anesthesia Transfer of Care Note  Patient: Brandi Stevens  Procedure(s) Performed: Procedure(s) (LRB) with comments: BREAST BIOPSY (Right) - Excision right breast mass  Patient Location: PACU  Anesthesia Type: General  Level of Consciousness: awake, alert  and oriented  Airway & Oxygen Therapy: Patient Spontanous Breathing and Patient connected to nasal cannula oxygen  Post-op Assessment: Report given to PACU RN and Post -op Vital signs reviewed and stable  Post vital signs: Reviewed and stable  Complications: No apparent anesthesia complications

## 2011-12-22 NOTE — Anesthesia Postprocedure Evaluation (Signed)
Anesthesia Post Note  Patient: Brandi Stevens  Procedure(s) Performed: Procedure(s) (LRB): BREAST BIOPSY (Right)  Anesthesia type: general  Patient location: PACU  Post pain: Pain level controlled  Post assessment: Patient's Cardiovascular Status Stable  Last Vitals:  Filed Vitals:   12/22/11 1300  BP:   Pulse: 58  Temp:   Resp: 15    Post vital signs: Reviewed and stable  Level of consciousness: sedated  Complications: No apparent anesthesia complications

## 2011-12-22 NOTE — Preoperative (Signed)
Beta Blockers   Reason not to administer Beta Blockers:Not Applicable 

## 2011-12-22 NOTE — Interval H&P Note (Signed)
History and Physical Interval Note:  12/22/2011 11:46 AM  Brandi Stevens  has presented today for surgery, with the diagnosis of Right breast mass  The various methods of treatment have been discussed with the patient and family. After consideration of risks, benefits and other options for treatment, the patient has consented to  Procedure(s) (LRB) with comments: BREAST BIOPSY (Right) - Excision right breast mass as a surgical intervention .  The patient's history has been reviewed, patient examined, no change in status, stable for surgery.  I have reviewed the patient's chart and labs.  Questions were answered to the patient's satisfaction.     Marigene Ehlers., Jed Limerick

## 2011-12-23 NOTE — Progress Notes (Signed)
Pt  Asked about when to bathe ... Skin glue intact over her  Incision.  Urged to call the office triage number ... But assured  24-48 hours would be ok to have a shower .Marland Kitchen

## 2011-12-24 ENCOUNTER — Encounter (HOSPITAL_COMMUNITY): Payer: Self-pay | Admitting: General Surgery

## 2012-01-01 ENCOUNTER — Encounter (INDEPENDENT_AMBULATORY_CARE_PROVIDER_SITE_OTHER): Payer: Self-pay | Admitting: General Surgery

## 2012-01-08 DIAGNOSIS — D249 Benign neoplasm of unspecified breast: Secondary | ICD-10-CM

## 2012-01-08 HISTORY — DX: Benign neoplasm of unspecified breast: D24.9

## 2012-01-15 ENCOUNTER — Encounter (INDEPENDENT_AMBULATORY_CARE_PROVIDER_SITE_OTHER): Payer: Self-pay | Admitting: General Surgery

## 2012-01-15 ENCOUNTER — Ambulatory Visit (INDEPENDENT_AMBULATORY_CARE_PROVIDER_SITE_OTHER): Payer: Self-pay | Admitting: General Surgery

## 2012-01-15 VITALS — BP 112/72 | HR 68 | Temp 97.9°F | Resp 12 | Ht 64.0 in | Wt 128.0 lb

## 2012-01-15 DIAGNOSIS — Z9889 Other specified postprocedural states: Secondary | ICD-10-CM

## 2012-01-15 NOTE — Progress Notes (Signed)
Patient ID: Brandi Stevens, female   DOB: [birth year], 56 y.o.   MRN: [id number] The patient's 56 year old female status post right breast mass removal.  The patient has been doing well postoperatively. The wound has been clean dry and intact. 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  followup in one month  For a wound check.

## 2012-02-16 ENCOUNTER — Encounter (INDEPENDENT_AMBULATORY_CARE_PROVIDER_SITE_OTHER): Payer: Self-pay | Admitting: General Surgery

## 2012-05-27 ENCOUNTER — Encounter: Payer: Self-pay | Admitting: Internal Medicine

## 2012-05-27 ENCOUNTER — Ambulatory Visit: Payer: Self-pay

## 2012-05-27 ENCOUNTER — Ambulatory Visit (INDEPENDENT_AMBULATORY_CARE_PROVIDER_SITE_OTHER): Payer: Self-pay | Admitting: Internal Medicine

## 2012-05-27 VITALS — BP 128/71 | HR 51 | Temp 97.1°F | Ht 65.0 in | Wt 124.9 lb

## 2012-05-27 DIAGNOSIS — J309 Allergic rhinitis, unspecified: Secondary | ICD-10-CM

## 2012-05-27 DIAGNOSIS — E0500 Thyrotoxicosis with diffuse goiter without thyrotoxic crisis or storm: Secondary | ICD-10-CM

## 2012-05-27 DIAGNOSIS — H1010 Acute atopic conjunctivitis, unspecified eye: Secondary | ICD-10-CM

## 2012-05-27 DIAGNOSIS — J438 Other emphysema: Secondary | ICD-10-CM

## 2012-05-27 DIAGNOSIS — Z1211 Encounter for screening for malignant neoplasm of colon: Secondary | ICD-10-CM

## 2012-05-27 DIAGNOSIS — M12819 Other specific arthropathies, not elsewhere classified, unspecified shoulder: Secondary | ICD-10-CM

## 2012-05-27 DIAGNOSIS — I498 Other specified cardiac arrhythmias: Secondary | ICD-10-CM

## 2012-05-27 DIAGNOSIS — M949 Disorder of cartilage, unspecified: Secondary | ICD-10-CM

## 2012-05-27 DIAGNOSIS — D241 Benign neoplasm of right breast: Secondary | ICD-10-CM

## 2012-05-27 DIAGNOSIS — Z136 Encounter for screening for cardiovascular disorders: Secondary | ICD-10-CM

## 2012-05-27 DIAGNOSIS — M8580 Other specified disorders of bone density and structure, unspecified site: Secondary | ICD-10-CM

## 2012-05-27 DIAGNOSIS — R001 Bradycardia, unspecified: Secondary | ICD-10-CM

## 2012-05-27 LAB — LIPID PANEL
HDL: 64 mg/dL (ref >39)
LDL Cholesterol: 105 mg/dL — ABNORMAL HIGH (ref 0–99)
Total CHOL/HDL Ratio: 2.8 Ratio

## 2012-05-27 LAB — TSH: TSH: 1.899 u[IU]/mL (ref 0.350–4.500)

## 2012-05-27 MED ORDER — FLUTICASONE PROPIONATE 50 MCG/ACT NA SUSP: 1.0000 | Freq: Two times a day (BID) | NASAL | Status: DC | PRN

## 2012-05-27 MED ORDER — ASPIRIN EC 81 MG PO TBEC: 81.0000 mg | DELAYED_RELEASE_TABLET | Freq: Every day | ORAL | Status: AC

## 2012-05-27 MED ORDER — CROMOLYN SODIUM 4 % OP SOLN: 1.0000 [drp] | Freq: Four times a day (QID) | OPHTHALMIC | Status: DC

## 2012-05-27 NOTE — Assessment & Plan Note (Signed)
S/P RAI ablation in 2008. Not on thyroid replacement for past several years. No baseline TSH in our records. Does have complaints of constipation, decreased appetite, and fatigue. She says she feels this way when her thyroid is "out of whack." - Check TSH today - Request records [facility], Dr. Abigail Miyamoto

## 2012-05-27 NOTE — Assessment & Plan Note (Signed)
Patient met today with [facility] colon cancer outreach.

## 2012-05-27 NOTE — Patient Instructions (Addendum)
1. Please start taking a baby aspirin (81mg ) daily. You can get this over the counter. It will protect you from stroke. 2. Keep taking all of your medicines. 3. I will call you if your lab tests look abnormal 4. Otherwise come back to the clinic in 6 months

## 2012-05-27 NOTE — Progress Notes (Signed)
Patient ID: Brandi Stevens, female   DOB: 1955-04-20, 57 y.o.   MRN: [id number]  Subjective:   Patient ID: Brandi Stevens female   DOB: 20-Jun-1955 57 y.o.   MRN: [id number]  HPI: Ms.Brandi Stevens is a 57 y.o. female with history of osteopenia, bradycardia, Graves' disease status post ablation, rotator cuff arthropathy presenting to the clinic to establish care. Patient has a history of Graves' disease which is status post radioiodine ablation in 2008. She reports that she has not taken thyroid replacement hormone currently or in the past.today she says that ever the past couple of months she has felt more tired than usual and some constipation. She has loss of appetite and has not been needing as much, which she says is responsible for about a 10 pound weight loss. She says that she feels this way when her "thyroid is out of whack." She cannot remember the last time her TSH was checked. She does have a history of bradycardia, which she's had for many years. She had an exercise treadmill test done in October 2013 prior to surgical procedure for benign breast mass excision. Exercise test was normal with resting heart rate noted to be in the 40s. Patient denies any symptoms such as dizziness, chest pain, shortness of breath, palpitations, syncope. She has a history of bilateral rotator cuff arthropathy with right sided rotator cuff tear and 2 prior surgeries on her right rotator cuff. She follows with [facility] orthopedics. She says she still has significant scar tissue in her right shoulder. She's doing physical therapy exercises for pain management at home and taking Tylenol. Patient has a 20-pack-year smoking history and quit smoking one year ago. She had a one year period of abstinence, but reports that she has started back smoking a few cigarettes a day due to stress her personal life. Her mother was recently diagnosed with pancreatic cancer. She's trying to cut back and plans to quit completely on her  own. In terms of other medical problems, she says she was diagnosed with mild emphysema in the 34s. She's a 20-pack-year smoking history. She is on any current inhaler therapy. She does not have any cough or wheezing. She's noted on DEXA scan in 2010 to have osteopenia and was started on calcium and vitamin D supplements. No falls. She's up-to-date on her mammograms. Last mammogram in 2013 showed a breast mass which was excised and noted to be benign fibroadenoma. No history of breast malignancies in the family. She's had a hysterectomy in the past. She is interested in talking about colon cancer screening today. She does not have a family history of colon cancer.    Past Medical History  Diagnosis Date  . Graves disease 1980s    RAI in 2008  . Arthritis     Bilateral shoulders.  . Bradycardia     Exercise stress test normal 2008  . 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 Emphysema     Last PFTs 1980s  . Rotator cuff tear arthropathy     R cuff  . Osteoarthritis of shoulder     Bilateral arthritis/bursitis  . Diverticulitis 2012   Current Outpatient Prescriptions  Medication Sig Dispense Refill  . acetaminophen (TYLENOL) 650 MG CR tablet Take 650 mg by mouth every 8 (eight) hours as needed for pain.      . Calcium Citrate (CALCITRATE PO) Take 1 tablet by mouth daily.      . cetirizine (ZYRTEC) 10 MG tablet  Take 10 mg by mouth daily as needed. For allergies      . cholecalciferol (VITAMIN D) 1000 UNITS tablet Take 1,000 Units by mouth daily.      . cromolyn (OPTICROM) 4 % ophthalmic solution Place 1 drop into both eyes 4 (four) times daily.  10 mL  5  . diclofenac sodium (VOLTAREN) 1 % GEL Apply 2 g topically daily as needed. For shoulder pain per patient      . fluticasone (FLONASE) 50 MCG/ACT nasal spray Place 1 spray into the nose 2 (two) times daily as needed. For allergies  16 g  3  . Multiple Vitamins-Minerals (CENTRUM SILVER PO) Take 1 tablet by mouth daily.      Marland Kitchen  aspirin EC 81 MG tablet Take 1 tablet (81 mg total) by mouth daily.  150 tablet  2  . traMADol (ULTRAM) 50 MG tablet Take 50 mg by mouth every 6 (six) hours as needed.       No current facility-administered medications for this visit.   Family History  Problem Relation Age of Onset  . Pancreatic cancer Mother 61    pancreatic  . Hearing loss Father   . Stroke Father 1    ischemic stroke  . Gastric cancer Maternal Aunt     gastric  . Colon cancer Maternal Grandfather 64   History   Social History  . Marital Status: Divorced    Spouse Name: N/A    Number of Children: 3  . 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 Smoking status: Current Some Day Smoker -- 1.00 packs/day for 20 years    Start date: 11/08/2011  . Smokeless tobacco: Never Used     Comment: Had successful abstinent period of 1 year last year, restarted when mother got sick  . Alcohol Use: No  . Drug Use: No  . Sexually Active: Yes    Birth Control/ Protection: Post-menopausal     Comment: Not sexually active in year and half. History of gonorrhea/chlamydia   Other Topics Concern  . None   Social History Narrative   Live in [place], lives alone. Sons nearby.    Review of Systems: 10 pt ROS performed, pertinent positives and negatives noted in HPI Objective:  Physical Exam: Filed Vitals:   05/27/12 1027  BP: 128/71  Pulse: 51  Temp: 97.1 F (36.2 C)  TempSrc: Oral  Height: 5\' 5"  (1.651 m)  Weight: 124 lb 14.4 oz (56.654 kg)  SpO2: 100%   Constitutional: Vital signs reviewed.  Patient is a thin female in no acute distress and cooperative with exam. Alert and oriented x3.  Head: Normocephalic and atraumatic Mouth: no erythema or exudates, MMM Eyes: PERRL, EOMI, conjunctivae normal, No scleral icterus.  Neck: Supple, no thyroid enlargement/tenderness  Cardiovascular: Rate approx 55 w regular rhythm, S1 normal, S2 normal, no  MRG, pulses symmetric and intact bilaterally Pulmonary/Chest: CTAB, no wheezes, rales, or rhonchi Abdominal: Soft. Non-tender, non-distended, bowel sounds are normal, no masses, organomegaly, or guarding present.  Musculoskeletal: Limited range of motion of right shoulder secondary to pain. Difficulty with internal rotation/adduction as well as external rotation/abduction. No synovitis or joint effusions. Hematology: no cervical adenopathy or visible bruising Neurological: A&O x3, Strength is normal and symmetric bilaterally, cranial nerve II-XII are grossly intact, no focal motor deficit, sensory intact to light touch bilaterally.  Skin: Warm, dry and intact. No  rash, cyanosis, or clubbing.  Psychiatric: Normal mood and affect. Assessment & Plan:   Please see problem-based charting for assessment and plan.

## 2012-05-27 NOTE — Assessment & Plan Note (Signed)
Stable today, neurovascularly intact. Patient continues to do PT exercises at home. - Tylenol for pain

## 2012-05-27 NOTE — Assessment & Plan Note (Signed)
HR in 50s today. Patient had dx of bradycardia "for years." Unclear if related to thyroid function. ETT last fall unremarkable. No symptoms dizziness/palpitations/dyspnea/CP.  I will request records from [facility], her primary PCP.

## 2012-05-27 NOTE — Assessment & Plan Note (Signed)
T score femur -2.3 on DEXA 2010. FRAX score 2.7% for fracture in next 10 years. On calcium and vitamin D. Patient without falls.  - Continue calcium and vitamin D - Will likely need repeat DEXA scan in the next couple of years, will defer today due to cost, therapy w Ca and Vit D, and low fracture risk as evidenced by FRAX score.

## 2012-05-27 NOTE — Assessment & Plan Note (Signed)
Patient reports that she was diagnosed with "mild emphysema" in 1980s by PFTs. Has 20 pack year smoking history, now smokes a few cigarettes per day. Not on inhaler therapy.  - No active sx - Will need repeat PFTs but will defer per patients wishes due to cost and desire to prioritize colon cancer screening.

## 2012-05-27 NOTE — Assessment & Plan Note (Signed)
Controlled w flonase, zyrtec, cromolyn eye drops - Will refill flonase today

## 2012-06-02 ENCOUNTER — Other Ambulatory Visit (INDEPENDENT_AMBULATORY_CARE_PROVIDER_SITE_OTHER): Payer: Self-pay

## 2012-06-02 DIAGNOSIS — Z1211 Encounter for screening for malignant neoplasm of colon: Secondary | ICD-10-CM

## 2012-06-02 LAB — POC HEMOCCULT BLD/STL (HOME/3-CARD/SCREEN)

## 2012-08-31 ENCOUNTER — Encounter: Payer: Self-pay | Admitting: Internal Medicine

## 2012-08-31 ENCOUNTER — Ambulatory Visit (INDEPENDENT_AMBULATORY_CARE_PROVIDER_SITE_OTHER): Payer: Self-pay | Admitting: Internal Medicine

## 2012-08-31 VITALS — BP 116/75 | HR 92 | Temp 99.2°F | Ht 65.0 in | Wt 124.8 lb

## 2012-08-31 DIAGNOSIS — L293 Anogenital pruritus, unspecified: Secondary | ICD-10-CM

## 2012-08-31 DIAGNOSIS — J309 Allergic rhinitis, unspecified: Secondary | ICD-10-CM

## 2012-08-31 DIAGNOSIS — H1010 Acute atopic conjunctivitis, unspecified eye: Secondary | ICD-10-CM

## 2012-08-31 DIAGNOSIS — M7750 Other enthesopathy of unspecified foot: Secondary | ICD-10-CM

## 2012-08-31 DIAGNOSIS — I498 Other specified cardiac arrhythmias: Secondary | ICD-10-CM

## 2012-08-31 DIAGNOSIS — N898 Other specified noninflammatory disorders of vagina: Secondary | ICD-10-CM | POA: Insufficient documentation

## 2012-08-31 DIAGNOSIS — R001 Bradycardia, unspecified: Secondary | ICD-10-CM

## 2012-08-31 DIAGNOSIS — Z1211 Encounter for screening for malignant neoplasm of colon: Secondary | ICD-10-CM

## 2012-08-31 DIAGNOSIS — M7742 Metatarsalgia, left foot: Secondary | ICD-10-CM | POA: Insufficient documentation

## 2012-08-31 MED ORDER — TRIAMCINOLONE ACETONIDE(NASAL) 55 MCG/ACT NA INHA: 1.0000 | Freq: Two times a day (BID) | NASAL | Status: DC

## 2012-08-31 MED ORDER — LORATADINE 10 MG PO TABS: 10.0000 mg | ORAL_TABLET | Freq: Every day | ORAL | Status: DC

## 2012-08-31 NOTE — Assessment & Plan Note (Addendum)
Has had years of "low heart rate," previously nl ETT 2013 reviewed in EPIC, pt reports followed by holter monitor through Dr. [facility]. Results of holter are not available in EPIC. Requested records from Dr. Katrinka Blazing. At any rate, HR in 90s w RR this visit and no CP/palpitations. Last TSH at goal.  Await records.   ADDENDUM 09/02/2012 4:17 PM  48 hour holter nl

## 2012-08-31 NOTE — Assessment & Plan Note (Signed)
Could not afford flonase or zyrtec. Will Rx nasacort, loratidine.

## 2012-08-31 NOTE — Patient Instructions (Addendum)
1. We will call you about your referral appointments.  2. i have sent new prescriptions for a nasal steroid spray and an allergy tablet - should be much more affordable and help your itchy eyes. 3. I will call you if anything is abnormal on your results today.

## 2012-08-31 NOTE — Progress Notes (Signed)
Patient ID: Brandi Stevens, female   DOB: 13-Jul-1955, 57 y.o.   MRN: [id number]  Subjective:   Patient ID: Brandi Stevens female   DOB: September 28, 1955 57 y.o.   MRN: [id number]  HPI: Brandi Stevens is a 57 y.o. female with history of osteopenia, bradycardia, Graves' disease status post ablation, rotator cuff arthropathy presenting to the clinic for follow up.  She now has insurance and would like referral to GI for colonoscopy.  She has had some L and now R metatarsal foot pain. Says this is chronic and previously saw a podiatrist in [place] before moving. Last orthotic Rx given 7 yrs ago. Complaining of vaginal itching and very mild whitish discharge. No pelvic pain. Last intercourse >40yr ago. Thinks she is due for a pap smear. Is s/p hysterectomy, says she thinks she still may have a cervix.   Says was not able to afford zyrtec or flonase. Still with runny/itching eyes, much worse after mowing lawn 2 days ago.     Past Medical History  Diagnosis Date  . Graves disease 1980s    RAI in 2008  . Arthritis     Bilateral shoulders.  . Bradycardia     Exercise stress test normal 2008  . 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 Emphysema     Last PFTs 1980s, not on current inhaler thearpy  . Rotator cuff tear arthropathy     R cuff  . Osteoarthritis of shoulder     Bilateral arthritis/bursitis  . Diverticulitis 2012   Current Outpatient Prescriptions  Medication Sig Dispense Refill  . acetaminophen (TYLENOL) 650 MG CR tablet Take 650 mg by mouth every 8 (eight) hours as needed for pain.      Marland Kitchen aspirin EC 81 MG tablet Take 1 tablet (81 mg total) by mouth daily.  150 tablet  2  . Calcium Citrate (CALCITRATE PO) Take 1 tablet by mouth daily.      . cholecalciferol (VITAMIN D) 1000 UNITS tablet Take 1,000 Units by mouth daily.      . cromolyn (OPTICROM) 4 % ophthalmic solution Place 1 drop into both eyes 4 (four) times daily.  10 mL  5  . diclofenac sodium (VOLTAREN) 1 % GEL Apply 2 g  topically daily as needed. For shoulder pain per patient      . loratadine (CLARITIN) 10 MG tablet Take 1 tablet (10 mg total) by mouth daily.  30 tablet  5  . Multiple Vitamins-Minerals (CENTRUM SILVER PO) Take 1 tablet by mouth daily.      . traMADol (ULTRAM) 50 MG tablet Take 50 mg by mouth every 6 (six) hours as needed.      . triamcinolone (NASACORT AQ) 55 MCG/ACT nasal inhaler Place 1 spray into the nose 2 (two) times daily.  1 Inhaler  2   No current facility-administered medications for this visit.   Family History  Problem Relation Age of Onset  . Pancreatic cancer Mother 56    pancreatic  . Hearing loss Father   . Stroke Father 71    ischemic stroke  . Gastric cancer Maternal Aunt     gastric  . Colon cancer Maternal Grandfather 90   History   Social History  . Marital Status: Divorced    Spouse Name: N/A    Number of Children: 3  . 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 Smoking status: Former Smoker -- 1.00 packs/day for 20 years    Start date: 11/08/2011  . Smokeless tobacco: Never Used     Comment: Quit x 3 weeks.  . Alcohol Use: No  . Drug Use: No  . Sexually Active: None   Other Topics Concern  . None   Social History Narrative   Live in [place], lives alone. Sons nearby.    Review of Systems: 10 pt ROS performed, pertinent positives and negatives noted in HPI Objective:  Physical Exam: Filed Vitals:   08/31/12 1542  BP: 116/75  Pulse: 92  Temp: 99.2 F (37.3 C)  TempSrc: Oral  Height: 5\' 5"  (1.651 m)  Weight: 124 lb 12.8 oz (56.609 kg)  SpO2: 99%   Constitutional: Vital signs reviewed.  Patient is a well-developed and well-nourished femle in no acute distress and cooperative with exam. Alert and oriented x3.  Head: Normocephalic and atraumatic Eyes: PERRL, EOMI, conjunctivae slightly injected, No scleral icterus.  Cardiovascular: RRR, S1 normal, S2 normal, no MRG,  pulses symmetric and intact bilaterally Pulmonary/Chest: CTAB, no wheezes, rales, or rhonchi Abdominal: Soft. Non-tender, non-distended, bowel sounds are normal, no masses, organomegaly, or guarding present.  GU: pink mucosa of vagina without erythema, small amount of white odorless discharge. Cervix surgically absent.  Musculoskeletal: TTP w manipulation of MTP joints on 1-3rd toes of L foot. No effusions/erythema. 2+ DP pulses bilaterally Skin: Warm, dry and intact. No rash, cyanosis, or clubbing.  Psychiatric: Normal mood and affect. Assessment & Plan:   Please see problem-based charting for assessment and plan.

## 2012-08-31 NOTE — Progress Notes (Signed)
Case discussed with Dr. Ziemer at the time of the visit.  We reviewed the resident's history and exam and pertinent patient test results.  I agree with the assessment, diagnosis, and plan of care documented in the resident's note.  

## 2012-08-31 NOTE — Assessment & Plan Note (Signed)
Previously followed for this years ago by out of town podiatrist, last orthotics 7 yrs ago, requests referral to local podiatrist here. - refer to local podiatry.

## 2012-08-31 NOTE — Assessment & Plan Note (Addendum)
Scant amt white discharge, odorless. Cervix surgical absent. No vaginal erythema.  - wet prep pending  ADDENDUM 09/02/2012 12:01 PM  Wet prep + candida. Called pt, no answer. LM to call back clinic. Sent in diflucan 150mg  x1 pill to pharmacy.

## 2012-08-31 NOTE — Assessment & Plan Note (Signed)
Now has health insurance, would like screening colonoscopy. - Referral initiated.

## 2012-09-02 ENCOUNTER — Telehealth: Payer: Self-pay | Admitting: *Deleted

## 2012-09-02 MED ORDER — FLUCONAZOLE 150 MG PO TABS: 150.0000 mg | ORAL_TABLET | Freq: Once | ORAL | Status: DC

## 2012-09-02 NOTE — Telephone Encounter (Signed)
Message left on recorder to have pt contact the office for lab results/new rx.  Wet prep positive for yeast and md has written rx for diflucan 150 mg, which I have called into [facility] pharmacy on [address].Kingsley Spittle Cassady6/27/20143:32 PM

## 2012-09-02 NOTE — Addendum Note (Signed)
Addended by: Ignacia Palma on: 09/02/2012 12:01 PM   Modules accepted: Orders

## 2012-09-22 NOTE — Addendum Note (Signed)
Addended by: Hassan Buckler on: 09/22/2012 01:26 PM   Modules accepted: Orders

## 2012-11-09 ENCOUNTER — Encounter: Payer: Self-pay | Admitting: Internal Medicine

## 2012-11-14 ENCOUNTER — Ambulatory Visit: Payer: BC Managed Care – PPO | Admitting: Internal Medicine

## 2013-02-14 NOTE — Addendum Note (Signed)
Addended by: Neomia Dear on: 02/14/2013 09:29 PM   Modules accepted: Orders

## 2013-04-12 ENCOUNTER — Ambulatory Visit (INDEPENDENT_AMBULATORY_CARE_PROVIDER_SITE_OTHER): Payer: No Typology Code available for payment source | Admitting: Internal Medicine

## 2013-04-12 ENCOUNTER — Encounter: Payer: Self-pay | Admitting: Internal Medicine

## 2013-04-12 VITALS — BP 125/64 | HR 58 | Temp 98.1°F | Ht 65.0 in | Wt 133.6 lb

## 2013-04-12 DIAGNOSIS — N898 Other specified noninflammatory disorders of vagina: Secondary | ICD-10-CM

## 2013-04-12 DIAGNOSIS — Z0000 Encounter for general adult medical examination without abnormal findings: Secondary | ICD-10-CM

## 2013-04-12 DIAGNOSIS — N390 Urinary tract infection, site not specified: Secondary | ICD-10-CM

## 2013-04-12 DIAGNOSIS — B3749 Other urogenital candidiasis: Secondary | ICD-10-CM

## 2013-04-12 LAB — POCT URINALYSIS DIPSTICK
BILIRUBIN UA: NEGATIVE
Glucose, UA: NEGATIVE
KETONES UA: NEGATIVE
Nitrite, UA: 0.2
Protein, UA: NEGATIVE
SPEC GRAV UA: 1.025
Urobilinogen, UA: 0.2
pH, UA: 5.5

## 2013-04-12 MED ORDER — NITROFURANTOIN MACROCRYSTAL 100 MG PO CAPS: 100.0000 mg | ORAL_CAPSULE | Freq: Two times a day (BID) | ORAL | Status: DC

## 2013-04-12 NOTE — Progress Notes (Signed)
   Subjective:    Patient ID: Brandi Stevens, female    DOB: November 02, 1955, 58 y.o.   MRN: [id number]  HPI Comments: Brandi Stevens is a 58 year old woman with a PMH of Grave's disease (s/p RAI), osteopenia and sinus bradycardia.  She presents with 7 month complaint of vaginal odor and black discharge.  It is a small amount of discharge but she notices it daily.  Positive flank pain in last 3 weeks.  Urine is dark (tea-colored). She denies fevers/chills, vaginal itching, white discharge or dysuria.  She denies shaving or douching.  Last had intercourse about 2 years ago.  Last menstrual period 1989 (s/p hysterectomy).        Review of Systems  Constitutional: Negative for fever, chills and appetite change.  Eyes: Negative for visual disturbance.  Respiratory: Negative for shortness of breath.   Cardiovascular: Negative for chest pain.  Gastrointestinal: Negative for nausea, vomiting, abdominal pain, diarrhea and blood in stool.  Endocrine: Negative for polyuria.  Genitourinary: Positive for flank pain and vaginal discharge. Negative for dysuria, frequency, vaginal bleeding, vaginal pain and pelvic pain.  Neurological: Negative for headaches.       Objective:   Physical Exam  Vitals reviewed. Constitutional: She appears well-developed. No distress.  Cardiovascular: Normal rate, regular rhythm and normal heart sounds.   Pulmonary/Chest: Effort normal and breath sounds normal. No respiratory distress. She has no wheezes. She has no rales.  Abdominal: Soft. Bowel sounds are normal. She exhibits no distension. There is no tenderness.  Genitourinary: Pelvic exam was performed with patient supine. No labial fusion. There is lesion on the right labia. There is no rash on the right labia. There is no rash on the left labia. No erythema, tenderness or bleeding around the vagina. No foreign body around the vagina. No signs of injury around the vagina. No vaginal discharge found.  + suprapubic tenderness;  on vaginal exam there is raised, soft, non-tender nodule on the right labia.  No vesicles or ulcers noted.  On speculum exam: the cervix is surgically absent; there is a white area on the right vaginal wall.  No bleeding or discharge. On manual exam the right vaginal wall is not smooth; there is a single round "mass" jutting out from the wall.  Musculoskeletal: She exhibits no tenderness.  Skin: She is not diaphoretic.  Psychiatric: She has a normal mood and affect. Her behavior is normal.          Assessment & Plan:  Please see problem based assessment and plan.

## 2013-04-12 NOTE — Patient Instructions (Signed)
1.Please take nitrofurantoin 100mg  twice daily for 5 days.  If you do not begin to feel better in a few days please call the clinic.  2. You will be contacted with the date and time for your OBGyn appointment.   3. Please take all medications as prescribed.    4. If you have worsening of your symptoms or new symptoms arise, please call the clinic ([phone]), or go to the ER immediately if symptoms are severe.

## 2013-04-12 NOTE — Assessment & Plan Note (Addendum)
Assessment:  Vaginal odor and discharge x 7 months.  No fevers, itching or dysuria.  Last intercourse was 2 years so STD less likely.  She has had hysterectomy so this is not uterine bleeding.  She says the nodule on her labia has been present for decades but that specialist have not wanted to remove it because of its location.  She would like it removed.  The abnormalities (palpable "mass") on her vaginal exam are concerning and warrant further work-up by OBGyn.  Differential include vaginal cyst vs poly vs malignancy.  Plan:  1) wet prep for BV, candida, trichomonas, GC/CT            2) referral to OBGyn for evaluation

## 2013-04-12 NOTE — Assessment & Plan Note (Addendum)
Suprapubic pain and + UA.  Mild flank discomfort but no fevers or vomiting to suggest pyelonephritis.  Will treat with nitrofurantoin 100mg  BID x 5 days.

## 2013-04-12 NOTE — Assessment & Plan Note (Signed)
Referred for colonoscopy

## 2013-04-13 LAB — URINALYSIS, ROUTINE W REFLEX MICROSCOPIC
BILIRUBIN URINE: NEGATIVE
GLUCOSE, UA: NEGATIVE mg/dL
Hgb urine dipstick: NEGATIVE
KETONES UR: NEGATIVE mg/dL
Nitrite: NEGATIVE
Protein, ur: NEGATIVE mg/dL
SPECIFIC GRAVITY, URINE: 1.023 (ref 1.005–1.030)
UROBILINOGEN UA: 0.2 mg/dL (ref 0.0–1.0)
pH: 5.5 (ref 5.0–8.0)

## 2013-04-13 LAB — URINALYSIS, MICROSCOPIC ONLY
Bacteria, UA: NONE SEEN
CRYSTALS: NONE SEEN
Casts: NONE SEEN

## 2013-04-13 LAB — URINE CULTURE
Colony Count: NO GROWTH
Organism ID, Bacteria: NO GROWTH

## 2013-04-13 NOTE — Progress Notes (Signed)
Case discussed with Dr. Wilson at the time of the visit.  We reviewed the resident's history and exam and pertinent patient test results.  I agree with the assessment, diagnosis, and plan of care documented in the resident's note. 

## 2013-04-14 ENCOUNTER — Telehealth: Payer: Self-pay | Admitting: Internal Medicine

## 2013-04-14 ENCOUNTER — Other Ambulatory Visit: Payer: Self-pay | Admitting: Internal Medicine

## 2013-04-14 MED ORDER — FLUCONAZOLE 150 MG PO TABS: 150.0000 mg | ORAL_TABLET | Freq: Every day | ORAL | Status: DC

## 2013-04-14 NOTE — Telephone Encounter (Signed)
I called Ms. Nold to inform her the wet prep was candida positive so I would be sending in a rx for Diflucan 150mg  x1.

## 2013-04-26 ENCOUNTER — Ambulatory Visit: Payer: Self-pay | Admitting: Obstetrics & Gynecology

## 2013-05-11 ENCOUNTER — Ambulatory Visit: Payer: Self-pay | Admitting: Obstetrics & Gynecology

## 2013-05-11 ENCOUNTER — Encounter: Payer: Self-pay | Admitting: *Deleted

## 2013-05-17 ENCOUNTER — Ambulatory Visit: Payer: Self-pay | Admitting: Obstetrics & Gynecology

## 2013-05-18 ENCOUNTER — Other Ambulatory Visit: Payer: Self-pay | Admitting: *Deleted

## 2013-05-18 ENCOUNTER — Ambulatory Visit (INDEPENDENT_AMBULATORY_CARE_PROVIDER_SITE_OTHER): Payer: No Typology Code available for payment source | Admitting: Obstetrics & Gynecology

## 2013-05-18 ENCOUNTER — Encounter: Payer: Self-pay | Admitting: Obstetrics & Gynecology

## 2013-05-18 VITALS — BP 107/67 | HR 56 | Temp 97.6°F | Ht 65.5 in | Wt 137.0 lb

## 2013-05-18 DIAGNOSIS — R1900 Intra-abdominal and pelvic swelling, mass and lump, unspecified site: Secondary | ICD-10-CM

## 2013-05-18 NOTE — Progress Notes (Signed)
Subjective:     Brandi Stevens is a 58 y.o. female here for a routine exam.  Current complaints: Referred by [facility]. Patient states upon a vaginal exam she was informed she had a vaginal mass and needed to be further evaluated.    Personal health questionnaire reviewed: yes.   Gynecologic History No LMP recorded. Patient is postmenopausal. Contraception: none Last mammogram: 08/2012. Results were: normal  Obstetric History OB History  Gravida Para Term Preterm AB SAB TAB Ectopic Multiple Living  3 3 3       3     # Outcome Date GA Lbr Len/2nd Weight Sex Delivery Anes PTL Lv  3 TRM 05/12/80 [redacted]w[redacted]d  7 lb (3.175 kg) M SVD None  Y  2 TRM 03/19/74 [redacted]w[redacted]d  8 lb (3.629 kg) M SVD None  Y  1 TRM 12/03/71 [redacted]w[redacted]d  7 lb (3.175 kg) M SVD None  Y       The following portions of the patient's history were reviewed and updated as appropriate: allergies, current medications, past family history, past medical history, past social history, past surgical history and problem list.  Review of Systems Pertinent items are noted in HPI.    Objective:     VULVA: normal appearing vulva with no masses, tenderness or lesions, VAGINA: normal appearing vagina with normal color and discharge, no lesions, UTERUS: surgically absent, ADNEXA: normal adnexa in size, nontender and no masses     Assessment:   Palpable ovaries, postmenopausal  Plan:    Pelvic U/S Return prn

## 2013-05-30 ENCOUNTER — Ambulatory Visit (HOSPITAL_COMMUNITY)
Admission: RE | Admit: 2013-05-30 | Discharge: 2013-05-30 | Disposition: A | Discharge status: Home / Self Care | Payer: No Typology Code available for payment source | Source: Ambulatory Visit | Attending: Obstetrics & Gynecology | Admitting: Obstetrics & Gynecology

## 2013-05-30 DIAGNOSIS — Z90710 Acquired absence of both cervix and uterus: Secondary | ICD-10-CM | POA: Insufficient documentation

## 2013-05-30 DIAGNOSIS — R1900 Intra-abdominal and pelvic swelling, mass and lump, unspecified site: Secondary | ICD-10-CM

## 2013-05-30 DIAGNOSIS — N9489 Other specified conditions associated with female genital organs and menstrual cycle: Secondary | ICD-10-CM | POA: Insufficient documentation

## 2013-06-08 ENCOUNTER — Other Ambulatory Visit: Payer: Self-pay

## 2013-06-08 ENCOUNTER — Ambulatory Visit (INDEPENDENT_AMBULATORY_CARE_PROVIDER_SITE_OTHER): Payer: No Typology Code available for payment source | Admitting: Obstetrics & Gynecology

## 2013-06-08 VITALS — BP 106/72 | HR 62 | Temp 98.2°F | Wt 133.0 lb

## 2013-06-08 DIAGNOSIS — N907 Vulvar cyst: Secondary | ICD-10-CM

## 2013-06-08 DIAGNOSIS — N9089 Other specified noninflammatory disorders of vulva and perineum: Secondary | ICD-10-CM

## 2013-06-08 MED ORDER — LUBIPROSTONE 24 MCG PO CAPS: 24.0000 ug | ORAL_CAPSULE | Freq: Two times a day (BID) | ORAL | Status: DC

## 2013-06-08 MED ORDER — DOXYCYCLINE HYCLATE 50 MG PO CAPS: 100.0000 mg | ORAL_CAPSULE | Freq: Two times a day (BID) | ORAL | Status: DC

## 2013-06-08 MED ORDER — TRAMADOL HCL 50 MG PO TABS: ORAL_TABLET | ORAL | Status: DC

## 2013-06-08 NOTE — Progress Notes (Signed)
Subjective:     Brandi Stevens is a 58 y.o. female here for a vulvar cyst removal exam.  Current complaints: Pt states that she has a external vulvar cyst.  Pt states that she was advised to f/u for removal.  Pt states that she also had an u/s performed on 3/12 and would like to know results.     The HPI was reviewed and explored in further detail by the provider. Gynecologic History No LMP recorded. Patient is postmenopausal.   Obstetric History OB History  Gravida Para Term Preterm AB SAB TAB Ectopic Multiple Living  3 3 3       3     # Outcome Date GA Lbr Len/2nd Weight Sex Delivery Anes PTL Lv  3 TRM 05/12/80 [redacted]w[redacted]d  7 lb (3.175 kg) M SVD None  Y  2 TRM 03/19/74 [redacted]w[redacted]d  8 lb (3.629 kg) M SVD None  Y  1 TRM 12/03/71 [redacted]w[redacted]d  7 lb (3.175 kg) M SVD None  Y       The following portions of the patient's history were reviewed and updated as appropriate: allergies, current medications, past family history, past medical history, past social history, past surgical history and problem list.  Review of Systems Pertinent items are noted in HPI.    Objective:    The skin overlying the right-sided 2 cm labia majus cyst was prepped with betadine.  After a time out was performed, the skin was infiltrated with 1% lidocaine.  The cyst was incised; caseous/sebaceous/?purulent material was noted.  The cyst wall was enucleated.  The defect was irrigated; silver nitrate was applied for hemostasis.  A dressing was applied.    Assessment:   S/P sebaceous cyst removal, vulva   Plan:     Doxycycline Pericare Return in 1 week

## 2013-06-08 NOTE — Patient Instructions (Signed)
Vulva Biopsy Care After Refer to this sheet in the next few weeks. These instructions provide you with information on caring for yourself after your procedure. Your caregiver may also give you more specific instructions. Your treatment has been planned according to current medical practices, but problems sometimes occur. Call your caregiver if you have any problems or questions after your procedure.  HOME CARE INSTRUCTIONS  Only take over-the-counter or prescription medicines for pain or discomfort as directed by your caregiver.  Do not rub the biopsy area after urinating. Gently pat the area dry or use a bottle filled with warm water (peri-bottle) to clean the area. Gently wipe from front to back.  Clean the area using water and mild soap. Gently pat the area dry.  Check your biopsy site with a handheld mirror daily to be sure it is healing properly.  Do not have intercourse for 3 4 days or as directed by your caregiver.  Avoid exercise, such as running or biking, for 2 3 days or as directed by your caregiver.  You may shower after the procedure. Avoid bathing and swimming until the sutures dissolve and healing is complete.  Wear loose cotton underwear. Do not wear tight pants.  Keep all follow-up appointments with your caregiver.  Contact your caregiver to obtain your test results. SEEK IMMEDIATE MEDICAL CARE IF:  Your pain increases and medicine does not help it.  Your vaginal area becomes swollen or red.  You have fluid or an abnormally bad smell coming from your vagina.  You have a fever. MAKE SURE YOU:  Understand these instructions.  Will watch your condition.  Will get help right away if you are not doing well or get worse. Document Released: 02/10/2012 Document Reviewed: 02/10/2012 [facility] Patient Information 2014 [place], [place]. Lubiprostone oral capsule What is this medicine? LUBIPROSTONE (loo bi PROS tone) is a laxative. It is used to treat chronic  constipation and constipation caused by opioids (certain prescription pain medicines). It is also used to treat adult women with irritable bowel syndrome who have constipation. This medicine may be used for other purposes; ask your health care provider or pharmacist if you have questions. COMMON BRAND NAME(S): Amitiza What should I tell my health care provider before I take this medicine? They need to know if you have any of these conditions: -cancer or tumor in abdomen, intestine, or stomach -history of bowel obstruction or adhesions -history of stool (fecal) impaction -liver disease -an unusual or allergic reaction to lubiprostone, other medicines, foods, dyes, or preservatives -pregnant or trying to get pregnant -breast-feeding How should I use this medicine? Take this medicine by mouth with a glass of water. Follow the directions on the prescription label. Do not cut, crush or chew this medicine. Take this medicine with food. Take your medicine at regular intervals. Do not take your medicine more often than directed. Do not stop taking except on your doctor's advice. Talk to your pediatrician regarding the use of this medicine in children. Special care may be needed. Overdosage: If you think you have taken too much of this medicine contact a poison control center or emergency room at once. NOTE: This medicine is only for you. Do not share this medicine with others. What if I miss a dose? If you miss a dose, take it as soon as you can. If it is almost time for your next dose, take only that dose. Do not take double or extra doses. What may interact with this medicine? -medicines that treat  diarrhea -methadone -other medicines for constipation This list may not describe all possible interactions. Give your health care provider a list of all the medicines, herbs, non-prescription drugs, or dietary supplements you use. Also tell them if you smoke, drink alcohol, or use illegal drugs. Some  items may interact with your medicine. What should I watch for while using this medicine? Visit your doctor for regular check ups. Tell your doctor if your symptoms do not get better or if they get worse. What side effects may I notice from receiving this medicine? Side effects that you should report to your doctor or health care professional as soon as possible: -allergic reactions like skin rash, itching or hives, swelling of the face, lips, or tongue -new or worsening stomach pain -severe or prolonged diarrhea Side effects that usually do not require medical attention (report to your prescriber or health care professional if they continue or are bothersome): -headache -loose stools -nausea This list may not describe all possible side effects. Call your doctor for medical advice about side effects. You may report side effects to FDA at 1-800-FDA-1088. Where should I keep my medicine? Keep out of the reach of children. Store at room temperature between 15 and 30 degrees C (59 and 86 degrees F). Throw away any unused medicine after the expiration date. NOTE: This sheet is a summary. It may not cover all possible information. If you have questions about this medicine, talk to your doctor, pharmacist, or health care provider.  2014, [employer]vier/[employer] Standard. (2011-07-03 09:24:31)

## 2013-06-09 ENCOUNTER — Encounter: Payer: Self-pay | Admitting: Obstetrics & Gynecology

## 2013-06-12 ENCOUNTER — Encounter: Payer: Self-pay | Admitting: Obstetrics & Gynecology

## 2013-06-12 NOTE — Addendum Note (Signed)
Addended by: Hulan Fray on: 06/12/2013 01:25 PM   Modules accepted: Orders

## 2013-06-13 ENCOUNTER — Ambulatory Visit: Payer: No Typology Code available for payment source | Admitting: Obstetrics

## 2013-06-15 ENCOUNTER — Ambulatory Visit (INDEPENDENT_AMBULATORY_CARE_PROVIDER_SITE_OTHER): Payer: No Typology Code available for payment source | Admitting: Obstetrics

## 2013-06-15 DIAGNOSIS — N907 Vulvar cyst: Secondary | ICD-10-CM

## 2013-06-15 DIAGNOSIS — N9089 Other specified noninflammatory disorders of vulva and perineum: Secondary | ICD-10-CM

## 2013-06-15 NOTE — Progress Notes (Signed)
Subjective:     Brandi Stevens is a 58 y.o. female here for a follow up vulvar cyst exam.  Current complaints: Pt states that she has been doing good since cyst was removed.  Pt states that she has just finished antibiotic that was given, Doxycycline.  Pt states that she is still having some redness around removal site.  Pt states that she has kept clean with betadine and keeping dry.     The HPI was reviewed and explored in further detail by the provider. Gynecologic History No LMP recorded. Patient is postmenopausal.    Obstetric History OB History  Gravida Para Term Preterm AB SAB TAB Ectopic Multiple Living  3 3 3       3     # Outcome Date GA Lbr Len/2nd Weight Sex Delivery Anes PTL Lv  3 TRM 05/12/80 [redacted]w[redacted]d  7 lb (3.175 kg) M SVD None  Y  2 TRM 03/19/74 [redacted]w[redacted]d  8 lb (3.629 kg) M SVD None  Y  1 TRM 12/03/71 [redacted]w[redacted]d  7 lb (3.175 kg) M SVD None  Y       The following portions of the patient's history were reviewed and updated as appropriate: allergies, current medications, past family history, past medical history, past social history, past surgical history and problem list.  Review of Systems A comprehensive review of systems was negative except for: Genitourinary: positive for genital lesions    Objective:    General appearance: alert and no distress Abdomen: normal findings: soft, non-tender Pelvic: external genitalia normal  Vulvar cyst bed clean and healing well.    Assessment:    Vulvar Cyst.  S/P cyst resection.  Doing well.       Plan:    Education reviewed: Care after vulvar cyst resection.. Follow up in: 4 weeks.

## 2013-06-27 ENCOUNTER — Encounter: Payer: Self-pay | Admitting: Obstetrics

## 2013-06-29 ENCOUNTER — Ambulatory Visit: Payer: No Typology Code available for payment source | Admitting: Obstetrics

## 2013-10-11 ENCOUNTER — Encounter: Payer: No Typology Code available for payment source | Admitting: Internal Medicine

## 2013-10-12 ENCOUNTER — Ambulatory Visit (INDEPENDENT_AMBULATORY_CARE_PROVIDER_SITE_OTHER): Payer: No Typology Code available for payment source | Admitting: Internal Medicine

## 2013-10-12 ENCOUNTER — Encounter: Payer: Self-pay | Admitting: Internal Medicine

## 2013-10-12 VITALS — BP 118/73 | HR 58 | Temp 98.8°F | Wt 139.5 lb

## 2013-10-12 DIAGNOSIS — R221 Localized swelling, mass and lump, neck: Secondary | ICD-10-CM

## 2013-10-12 DIAGNOSIS — Z8639 Personal history of other endocrine, nutritional and metabolic disease: Secondary | ICD-10-CM

## 2013-10-12 DIAGNOSIS — R220 Localized swelling, mass and lump, head: Secondary | ICD-10-CM

## 2013-10-12 DIAGNOSIS — Z862 Personal history of diseases of the blood and blood-forming organs and certain disorders involving the immune mechanism: Secondary | ICD-10-CM

## 2013-10-12 DIAGNOSIS — R590 Localized enlarged lymph nodes: Secondary | ICD-10-CM | POA: Insufficient documentation

## 2013-10-12 NOTE — Assessment & Plan Note (Signed)
Pt has hx of Grave's disease s/p radiation ablation in 2008. She notes the nodule in right submandibular region since 2008. Nodule has grown larger in last 6 months. Nodule is fluent, non-tender, approximately 1 cm in size. Pt having symptoms of hyperthyroidism including palpitations, sweating, weakness in lower extremities in last month. Nodule possibly submandibular gland swelling vs. Parotid gland swelling vs. Swollen lymph node vs. Thyroid nodule (less likely given location). - CT Cervical Spine to evaluate nodule and thyroid - TSH - Free T4 - BMET

## 2013-10-12 NOTE — Patient Instructions (Signed)
It was a pleasure taking care of you today, Ms. Wertenberger.  Here is a summary of what we discussed:  1. Enlarged nodule: - CT Scan of head/neck - Blood work: TSH, T4, basic metabolic panel - follow-up appointment in 2-3 weeks

## 2013-10-12 NOTE — Progress Notes (Signed)
   Subjective:    Patient ID: Brandi Stevens, female    DOB: May 26, 1955, 58 y.o.   MRN: [id number]  HPI Brandi Stevens is a 58yo woman w/ PMHx of Graves disease s/p radiation ablation in 2008 and hx smoking (quit 1 year ago) who presents to the clinic with a enlarged nodule. Patient reports she has had this nodule since 2008, but was told that "it was nothing to worry about." She has noticed that it has grown larger in the last 6 months. The nodule is located in the right submandibular region. She denies any pain in the region. Patient states she has noticed the right side of her face is swollen compared to the left. She also notes her dentist noticed the nodule and her swollen face at a recent appointment. She reports never being on medication after her thyroid ablation. Last TSH was 1.899 on 05/27/12. Pt reports in the last month she has had palpitations, sweating, shakiness, right ear pain, and weakness in her legs while climbing up stairs. She notes that she is also going through menopause and attributes some of her symptoms to that as well. She does not have an endocrinologist currently, but was seeing Dr. Renard Hamper in [place]. She denies fever, chills, night sweats, changes in weight, diarrhea, and constipation.    Review of Systems General: See HPI HEENT: Denies headaches, changes in vision, rhinorrhea, sore throat CV: Denies CP, SOB, orthopnea Pulm: Denies SOB, cough, wheezing GI: Denies melena, hematochezia GU: Denies dysuria, hematuria Msk: Denies muscle cramps, joint pain Neuro: Denies numbness, tingling Skin: Denies rashes, bruising    Objective:   Physical Exam General: appears stated age, sitting up in chair, NAD HEENT: [place]/AT, EOMI, conjunctiva normal. No erythema or lesions present in oral cavity or pharynx. A 1 cm fluent nodule is palpated in the right submandibular region. No tenderness present when palpating nodule. Right side of face appears more swollen than left. Ear canals and  tympanic membranes appear normal bilaterally.   Neck: supple, no JVD, no thyromegaly, no thyroid nodules palpated CV: RRR, normal S1/S2, no m/g/r Pulm: CTA bilaterally, breaths non-labored Abd: BS+, soft, non-tender, non-distended Ext: warm, moves all, no edema Neuro: alert and oriented x 3, CNs II-XII intact, strength 5/5 in upper and lower extremities      Assessment & Plan:

## 2013-10-13 ENCOUNTER — Telehealth: Payer: Self-pay | Admitting: Internal Medicine

## 2013-10-13 LAB — T4, FREE: FREE T4: 0.9 ng/dL (ref 0.80–1.80)

## 2013-10-13 LAB — BASIC METABOLIC PANEL
BUN: 9 mg/dL (ref 6–23)
CHLORIDE: 108 meq/L (ref 96–112)
CO2: 29 meq/L (ref 19–32)
Calcium: 10.3 mg/dL (ref 8.4–10.5)
Creat: 0.77 mg/dL (ref 0.50–1.10)
GLUCOSE: 95 mg/dL (ref 70–99)
POTASSIUM: 3.8 meq/L (ref 3.5–5.3)
SODIUM: 144 meq/L (ref 135–145)

## 2013-10-13 LAB — TSH: TSH: 3.219 u[IU]/mL (ref 0.350–4.500)

## 2013-10-13 NOTE — Telephone Encounter (Signed)
I left a message on pt's voicemail about normal thyroid studies.

## 2013-10-16 NOTE — Progress Notes (Signed)
I saw and evaluated the patient.  I personally confirmed the key portions of the history and exam documented by Dr. Arcelia Jew and I reviewed pertinent patient test results.  The assessment, diagnosis, and plan were formulated together and I agree with the documentation in the resident's note.

## 2013-10-24 ENCOUNTER — Other Ambulatory Visit: Payer: Self-pay | Admitting: Internal Medicine

## 2013-10-24 DIAGNOSIS — R221 Localized swelling, mass and lump, neck: Secondary | ICD-10-CM

## 2013-10-25 ENCOUNTER — Encounter: Payer: No Typology Code available for payment source | Admitting: Internal Medicine

## 2013-10-26 ENCOUNTER — Ambulatory Visit (HOSPITAL_COMMUNITY): Payer: No Typology Code available for payment source

## 2013-10-27 ENCOUNTER — Encounter (HOSPITAL_COMMUNITY): Payer: Self-pay

## 2013-10-27 ENCOUNTER — Ambulatory Visit (HOSPITAL_COMMUNITY)
Admission: RE | Admit: 2013-10-27 | Discharge: 2013-10-27 | Disposition: A | Discharge status: Home / Self Care | Payer: No Typology Code available for payment source | Source: Ambulatory Visit | Attending: Internal Medicine | Admitting: Internal Medicine

## 2013-10-27 ENCOUNTER — Other Ambulatory Visit: Payer: Self-pay | Admitting: Internal Medicine

## 2013-10-27 DIAGNOSIS — R221 Localized swelling, mass and lump, neck: Secondary | ICD-10-CM | POA: Diagnosis present

## 2013-10-27 DIAGNOSIS — R599 Enlarged lymph nodes, unspecified: Secondary | ICD-10-CM | POA: Insufficient documentation

## 2013-10-27 DIAGNOSIS — R220 Localized swelling, mass and lump, head: Secondary | ICD-10-CM | POA: Diagnosis present

## 2013-10-27 MED ORDER — IOHEXOL 300 MG/ML  SOLN
80.0000 mL | Freq: Once | INTRAMUSCULAR | Status: AC | PRN
  Administered 2013-10-27: 80 mL via INTRAVENOUS

## 2013-11-01 ENCOUNTER — Encounter: Payer: Self-pay | Admitting: Internal Medicine

## 2013-11-01 ENCOUNTER — Ambulatory Visit (INDEPENDENT_AMBULATORY_CARE_PROVIDER_SITE_OTHER): Payer: No Typology Code available for payment source | Admitting: Internal Medicine

## 2013-11-01 VITALS — BP 136/81 | HR 58 | Temp 99.3°F | Ht 65.0 in | Wt 139.2 lb

## 2013-11-01 DIAGNOSIS — R590 Localized enlarged lymph nodes: Secondary | ICD-10-CM

## 2013-11-01 DIAGNOSIS — R599 Enlarged lymph nodes, unspecified: Secondary | ICD-10-CM

## 2013-11-01 DIAGNOSIS — E0500 Thyrotoxicosis with diffuse goiter without thyrotoxic crisis or storm: Secondary | ICD-10-CM

## 2013-11-01 DIAGNOSIS — Z0000 Encounter for general adult medical examination without abnormal findings: Secondary | ICD-10-CM

## 2013-11-01 NOTE — Patient Instructions (Signed)
1. We will continue to monitor your swollen lymph nodes.  Please return to see me in 3 months or sooner if you develop new or worsening symptoms.   2. Please take all medications as prescribed.    3. If you have worsening of your symptoms or new symptoms arise, please call the clinic ([phone]), or go to the ER immediately if symptoms are severe.

## 2013-11-01 NOTE — Progress Notes (Signed)
   Subjective:    Patient ID: Brandi Stevens, female    DOB: Jun 09, 1955, 58 y.o.   MRN: [id number]  HPI Comments: Ms. Pletz is a 58 year old woman with a PMH of Grave's disease (s/p RAI ablation in 2008), osteopenia and sinus bradycardia.  She was seen 1 week ago for palpable nodule in her neck and is here for results of CT soft tissue neck.     Review of Systems  Constitutional: Negative for fever, chills and appetite change.  Respiratory: Negative for shortness of breath.   Cardiovascular: Positive for palpitations. Negative for chest pain and leg swelling.       Occasional  Gastrointestinal: Positive for nausea and constipation. Negative for vomiting and diarrhea.       Chronic  Endocrine: Negative for polydipsia and polyuria.  Genitourinary: Negative for dysuria and hematuria.  Neurological: Negative for syncope, weakness and headaches.       Objective:   Physical Exam  Vitals reviewed. Constitutional: She is oriented to person, place, and time. She appears well-developed. No distress.  HENT:  Head: Normocephalic and atraumatic.  Right Ear: External ear normal.  Left Ear: External ear normal.  Mouth/Throat: Oropharynx is clear and moist. No oropharyngeal exudate.  B/L TMs obscured by wax; external ear and mastoid process non-tender B/L  Eyes: EOM are normal. Pupils are equal, round, and reactive to light.  Neck: No thyromegaly present.  No thyroid nodules appreciated.  There is B/L submandibular fullness; there is a single ~ 1cm soft, mobile, non-tender nodule on the right directly below the mandible.  I can appreciate no axillary or epitrochlear adenopathy.  Cardiovascular: Normal rate, regular rhythm and normal heart sounds.  Exam reveals no gallop and no friction rub.   No murmur heard. Pulmonary/Chest: Effort normal and breath sounds normal. No respiratory distress. She has no wheezes. She has no rales.  Abdominal: Soft. Bowel sounds are normal. She exhibits no  distension. There is no tenderness.  Lymphadenopathy:    She has cervical adenopathy.  Neurological: She is alert and oriented to person, place, and time. No cranial nerve deficit.  Skin: Skin is warm. She is not diaphoretic.  Psychiatric: She has a normal mood and affect. Her behavior is normal.          Assessment & Plan:  Please see problem based assessment and plan.

## 2013-11-02 NOTE — Assessment & Plan Note (Signed)
TSH and Free T4 normal three weeks ago.  Will continue to measure TSH and T4 every 6-12 months.

## 2013-11-02 NOTE — Assessment & Plan Note (Addendum)
Patient reports Nodule present since prior to ablation in 2008.  She was reassured at that time.  She feels it has gotten larger in  Past 6 months and sometimes her face looks swollen.  No swelling of her face today.  CT neck suggests no worrisome abnormalities; there are two prominent but benign appearing lymph nodes in the submandibular space just anterior to the submandibular gland.  I am able to feel a small ~1cm nodule under the right mandible on exam.  It is soft and mobile.  I am unable to palpate nodes in the axillary or epitrochlear region or in the rest of the cervical chain.  She denies recent illness, her weight is stable and she does not have generalized lymphadenopathy so I doubt serious infection or head and neck malignancy.  Thyroid studies are normal so I doubt involvement of thyroid. - will have her follow-up in 3 months and continue to monitor growth or change in the nodule - she will call for earlier appointment if she experiences new symptoms or notices a change in the nodule

## 2013-11-02 NOTE — Assessment & Plan Note (Addendum)
I have referred for colonoscopy in Feb 2015, however patient says she has been unable to get this done because she has an outstanding debt.  Will attempt to refer again, perhaps another practice will see her.  She is due for mammogram, will refer today.

## 2013-11-03 NOTE — Addendum Note (Signed)
Addended by: Hulan Fray on: 11/03/2013 05:54 PM   Modules accepted: Orders

## 2013-11-08 ENCOUNTER — Ambulatory Visit (HOSPITAL_COMMUNITY): Payer: No Typology Code available for payment source

## 2013-11-08 NOTE — Progress Notes (Signed)
Case discussed with Dr. Wilson at time of visit. We reviewed the resident's history and exam and pertinent patient test results. I agree with the assessment, diagnosis, and plan of care documented in the resident's note. 

## 2014-01-08 ENCOUNTER — Encounter: Payer: Self-pay | Admitting: Internal Medicine

## 2014-03-05 ENCOUNTER — Encounter: Payer: Self-pay | Admitting: *Deleted

## 2014-03-06 ENCOUNTER — Encounter: Payer: Self-pay | Admitting: Obstetrics & Gynecology

## 2014-04-10 ENCOUNTER — Ambulatory Visit: Payer: No Typology Code available for payment source | Admitting: Internal Medicine

## 2014-11-06 ENCOUNTER — Other Ambulatory Visit (INDEPENDENT_AMBULATORY_CARE_PROVIDER_SITE_OTHER): Payer: 59

## 2014-11-06 ENCOUNTER — Ambulatory Visit (INDEPENDENT_AMBULATORY_CARE_PROVIDER_SITE_OTHER): Payer: 59 | Admitting: Internal Medicine

## 2014-11-06 ENCOUNTER — Encounter: Payer: Self-pay | Admitting: Internal Medicine

## 2014-11-06 ENCOUNTER — Encounter: Payer: Self-pay | Admitting: Gastroenterology

## 2014-11-06 VITALS — BP 102/70 | HR 68 | Temp 97.9°F | Resp 12 | Ht 65.0 in | Wt 128.8 lb

## 2014-11-06 DIAGNOSIS — E0500 Thyrotoxicosis with diffuse goiter without thyrotoxic crisis or storm: Secondary | ICD-10-CM

## 2014-11-06 DIAGNOSIS — Z23 Encounter for immunization: Secondary | ICD-10-CM

## 2014-11-06 DIAGNOSIS — Z0000 Encounter for general adult medical examination without abnormal findings: Secondary | ICD-10-CM

## 2014-11-06 DIAGNOSIS — Z1211 Encounter for screening for malignant neoplasm of colon: Secondary | ICD-10-CM

## 2014-11-06 LAB — COMPREHENSIVE METABOLIC PANEL
ALBUMIN: 4.4 g/dL (ref 3.5–5.2)
ALT: 8 U/L (ref 0–35)
AST: 13 U/L (ref 0–37)
Alkaline Phosphatase: 90 U/L (ref 39–117)
BILIRUBIN TOTAL: 0.3 mg/dL (ref 0.2–1.2)
BUN: 6 mg/dL (ref 6–23)
CALCIUM: 9.9 mg/dL (ref 8.4–10.5)
CO2: 29 mEq/L (ref 19–32)
CREATININE: 0.76 mg/dL (ref 0.40–1.20)
Chloride: 109 mEq/L (ref 96–112)
GFR: 100.46 mL/min (ref >60.00)
Glucose, Bld: 101 mg/dL — ABNORMAL HIGH (ref 70–99)
Potassium: 4.6 mEq/L (ref 3.5–5.1)
Sodium: 143 mEq/L (ref 135–145)
TOTAL PROTEIN: 7.5 g/dL (ref 6.0–8.3)

## 2014-11-06 LAB — LIPID PANEL
CHOLESTEROL: 176 mg/dL (ref 0–200)
HDL: 54.6 mg/dL (ref >39.00)
LDL Cholesterol: 105 mg/dL — ABNORMAL HIGH (ref 0–99)
NonHDL: 121.75
TRIGLYCERIDES: 83 mg/dL (ref 0.0–149.0)
Total CHOL/HDL Ratio: 3
VLDL: 16.6 mg/dL (ref 0.0–40.0)

## 2014-11-06 LAB — CBC
HEMATOCRIT: 42 % (ref 36.0–46.0)
HEMOGLOBIN: 13.8 g/dL (ref 12.0–15.0)
MCHC: 32.8 g/dL (ref 30.0–36.0)
MCV: 92.5 fl (ref 78.0–100.0)
Platelets: 338 10*3/uL (ref 150.0–400.0)
RBC: 4.54 Mil/uL (ref 3.87–5.11)
RDW: 13.9 % (ref 11.5–15.5)
WBC: 4.9 10*3/uL (ref 4.0–10.5)

## 2014-11-06 LAB — T4, FREE: Free T4: 0.73 ng/dL (ref 0.60–1.60)

## 2014-11-06 LAB — TSH: TSH: 3.48 u[IU]/mL (ref 0.35–4.50)

## 2014-11-06 NOTE — Assessment & Plan Note (Signed)
Checking labs, referral for colonoscopy placed (due to chronic constipation as well as family history of colon cancer, never had screening). Talked to her about regular exercise and sun safety.

## 2014-11-06 NOTE — Progress Notes (Signed)
Pre visit review using our clinic review tool, if applicable. No additional management support is needed unless otherwise documented below in the visit note. 

## 2014-11-06 NOTE — Patient Instructions (Signed)
We will check your lab work today and call you back with the results.   We will get you into the GI doctor so hopefully you can get the colonoscopy.   Come back in about 6-12 months for a check up and if you have any problems or questions sooner please feel free to call the office.   Health Maintenance Adopting a healthy lifestyle and getting preventive care can go a long way to promote health and wellness. Talk with your health care provider about what schedule of regular examinations is right for you. This is a good chance for you to check in with your provider about disease prevention and staying healthy. In between checkups, there are plenty of things you can do on your own. Experts have done a lot of research about which lifestyle changes and preventive measures are most likely to keep you healthy. Ask your health care provider for more information. WEIGHT AND DIET  Eat a healthy diet  Be sure to include plenty of vegetables, fruits, low-fat dairy products, and lean protein.  Do not eat a lot of foods high in solid fats, added sugars, or salt.  Get regular exercise. This is one of the most important things you can do for your health.  Most adults should exercise for at least 150 minutes each week. The exercise should increase your heart rate and make you sweat (moderate-intensity exercise).  Most adults should also do strengthening exercises at least twice a week. This is in addition to the moderate-intensity exercise.  Maintain a healthy weight  Body mass index (BMI) is a measurement that can be used to identify possible weight problems. It estimates body fat based on height and weight. Your health care provider can help determine your BMI and help you achieve or maintain a healthy weight.  For females 28 years of age and older:   A BMI below 18.5 is considered underweight.  A BMI of 18.5 to 24.9 is normal.  A BMI of 25 to 29.9 is considered overweight.  A BMI of 30 and above  is considered obese.  Watch levels of cholesterol and blood lipids  You should start having your blood tested for lipids and cholesterol at 58 years of age, then have this test every 5 years.  You may need to have your cholesterol levels checked more often if:  Your lipid or cholesterol levels are high.  You are older than 58 years of age.  You are at high risk for heart disease.  CANCER SCREENING   Lung Cancer  Lung cancer screening is recommended for adults 84-54 years old who are at high risk for lung cancer because of a history of smoking.  A yearly low-dose CT scan of the lungs is recommended for people who:  Currently smoke.  Have quit within the past 15 years.  Have at least a 30-pack-year history of smoking. A pack year is smoking an average of one pack of cigarettes a day for 1 year.  Yearly screening should continue until it has been 15 years since you quit.  Yearly screening should stop if you develop a health problem that would prevent you from having lung cancer treatment.  Breast Cancer  Practice breast self-awareness. This means understanding how your breasts normally appear and feel.  It also means doing regular breast self-exams. Let your health care provider know about any changes, no matter how small.  If you are in your 20s or 30s, you should have a clinical breast  exam (CBE) by a health care provider every 1-3 years as part of a regular health exam.  If you are 64 or older, have a CBE every year. Also consider having a breast X-ray (mammogram) every year.  If you have a family history of breast cancer, talk to your health care provider about genetic screening.  If you are at high risk for breast cancer, talk to your health care provider about having an MRI and a mammogram every year.  Breast cancer gene (BRCA) assessment is recommended for women who have family members with BRCA-related cancers. BRCA-related cancers  include:  Breast.  Ovarian.  Tubal.  Peritoneal cancers.  Results of the assessment will determine the need for genetic counseling and BRCA1 and BRCA2 testing. Cervical Cancer Routine pelvic examinations to screen for cervical cancer are no longer recommended for nonpregnant women who are considered low risk for cancer of the pelvic organs (ovaries, uterus, and vagina) and who do not have symptoms. A pelvic examination may be necessary if you have symptoms including those associated with pelvic infections. Ask your health care provider if a screening pelvic exam is right for you.   The Pap test is the screening test for cervical cancer for women who are considered at risk.  If you had a hysterectomy for a problem that was not cancer or a condition that could lead to cancer, then you no longer need Pap tests.  If you are older than 65 years, and you have had normal Pap tests for the past 10 years, you no longer need to have Pap tests.  If you have had past treatment for cervical cancer or a condition that could lead to cancer, you need Pap tests and screening for cancer for at least 20 years after your treatment.  If you no longer get a Pap test, assess your risk factors if they change (such as having a new sexual partner). This can affect whether you should start being screened again.  Some women have medical problems that increase their chance of getting cervical cancer. If this is the case for you, your health care provider may recommend more frequent screening and Pap tests.  The human papillomavirus (HPV) test is another test that may be used for cervical cancer screening. The HPV test looks for the virus that can cause cell changes in the cervix. The cells collected during the Pap test can be tested for HPV.  The HPV test can be used to screen women 63 years of age and older. Getting tested for HPV can extend the interval between normal Pap tests from three to five years.  An HPV  test also should be used to screen women of any age who have unclear Pap test results.  After 58 years of age, women should have HPV testing as often as Pap tests.  Colorectal Cancer  This type of cancer can be detected and often prevented.  Routine colorectal cancer screening usually begins at 57 years of age and continues through 58 years of age.  Your health care provider may recommend screening at an earlier age if you have risk factors for colon cancer.  Your health care provider may also recommend using home test kits to check for hidden blood in the stool.  A small camera at the end of a tube can be used to examine your colon directly (sigmoidoscopy or colonoscopy). This is done to check for the earliest forms of colorectal cancer.  Routine screening usually begins at age 91.  Direct examination of the colon should be repeated every 5-10 years through 58 years of age. However, you may need to be screened more often if early forms of precancerous polyps or small growths are found. Skin Cancer  Check your skin from head to toe regularly.  Tell your health care provider about any new moles or changes in moles, especially if there is a change in a mole's shape or color.  Also tell your health care provider if you have a mole that is larger than the size of a pencil eraser.  Always use sunscreen. Apply sunscreen liberally and repeatedly throughout the day.  Protect yourself by wearing long sleeves, pants, a wide-brimmed hat, and sunglasses whenever you are outside. HEART DISEASE, DIABETES, AND HIGH BLOOD PRESSURE   Have your blood pressure checked at least every 1-2 years. High blood pressure causes heart disease and increases the risk of stroke.  If you are between 83 years and 38 years old, ask your health care provider if you should take aspirin to prevent strokes.  Have regular diabetes screenings. This involves taking a blood sample to check your fasting blood sugar  level.  If you are at a normal weight and have a low risk for diabetes, have this test once every three years after 58 years of age.  If you are overweight and have a high risk for diabetes, consider being tested at a younger age or more often. PREVENTING INFECTION  Hepatitis B  If you have a higher risk for hepatitis B, you should be screened for this virus. You are considered at high risk for hepatitis B if:  You were born in a country where hepatitis B is common. Ask your health care provider which countries are considered high risk.  Your parents were born in a high-risk country, and you have not been immunized against hepatitis B (hepatitis B vaccine).  You have HIV or AIDS.  You use needles to inject street drugs.  You live with someone who has hepatitis B.  You have had sex with someone who has hepatitis B.  You get hemodialysis treatment.  You take certain medicines for conditions, including cancer, organ transplantation, and autoimmune conditions. Hepatitis C  Blood testing is recommended for:  Everyone born from [birth year] through 1964.  Anyone with known risk factors for hepatitis C. Sexually transmitted infections (STIs)  You should be screened for sexually transmitted infections (STIs) including gonorrhea and chlamydia if:  You are sexually active and are younger than 58 years of age.  You are older than 58 years of age and your health care provider tells you that you are at risk for this type of infection.  Your sexual activity has changed since you were last screened and you are at an increased risk for chlamydia or gonorrhea. Ask your health care provider if you are at risk.  If you do not have HIV, but are at risk, it may be recommended that you take a prescription medicine daily to prevent HIV infection. This is called pre-exposure prophylaxis (PrEP). You are considered at risk if:  You are sexually active and do not regularly use condoms or know the HIV status  of your partner(s).  You take drugs by injection.  You are sexually active with a partner who has HIV. Talk with your health care provider about whether you are at high risk of being infected with HIV. If you choose to begin PrEP, you should first be tested for HIV. You should then be  tested every 3 months for as long as you are taking PrEP.  PREGNANCY   If you are premenopausal and you may become pregnant, ask your health care provider about preconception counseling.  If you may become pregnant, take 400 to 800 micrograms (mcg) of folic acid every day.  If you want to prevent pregnancy, talk to your health care provider about birth control (contraception). OSTEOPOROSIS AND MENOPAUSE   Osteoporosis is a disease in which the bones lose minerals and strength with aging. This can result in serious bone fractures. Your risk for osteoporosis can be identified using a bone density scan.  If you are 65 years of age or older, or if you are at risk for osteoporosis and fractures, ask your health care provider if you should be screened.  Ask your health care provider whether you should take a calcium or vitamin D supplement to lower your risk for osteoporosis.  Menopause may have certain physical symptoms and risks.  Hormone replacement therapy may reduce some of these symptoms and risks. Talk to your health care provider about whether hormone replacement therapy is right for you.  HOME CARE INSTRUCTIONS   Schedule regular health, dental, and eye exams.  Stay current with your immunizations.   Do not use any tobacco products including cigarettes, chewing tobacco, or electronic cigarettes.  If you are pregnant, do not drink alcohol.  If you are breastfeeding, limit how much and how often you drink alcohol.  Limit alcohol intake to no more than 1 drink per day for nonpregnant women. One drink equals 12 ounces of beer, 5 ounces of wine, or 1 ounces of hard liquor.  Do not use street  drugs.  Do not share needles.  Ask your health care provider for help if you need support or information about quitting drugs.  Tell your health care provider if you often feel depressed.  Tell your health care provider if you have ever been abused or do not feel safe at home. Document Released: 09/08/2010 Document Revised: 07/10/2013 Document Reviewed: 01/25/2013 ExitCare Patient Information 2015 [employer]. This information is not intended to replace advice given to you by your health care provider. Make sure you discuss any questions you have with your health care provider.  

## 2014-11-06 NOTE — Progress Notes (Signed)
   Subjective:    Patient ID: Brandi Stevens, female    DOB: 19-Sep-1955, 59 y.o.   MRN: [id number]  HPI The patient is a 59 YO female coming in new for wellness. No new complaints. Please see A/P for status of chronic conditions. Works as a Quarry manager at [employer].   PMH, [facility], social history reviewed and updated.   Review of Systems  Constitutional: Negative for fever, activity change, appetite change, fatigue and unexpected weight change.  HENT: Negative.   Eyes: Negative.   Respiratory: Negative for cough, chest tightness, shortness of breath and wheezing.   Cardiovascular: Negative for chest pain, palpitations and leg swelling.  Gastrointestinal: Positive for constipation. Negative for nausea, abdominal pain, diarrhea, blood in stool and abdominal distention.  Musculoskeletal: Negative.        Old right shoulder injury  Skin: Negative.   Neurological: Negative.   Psychiatric/Behavioral: Negative.       Objective:   Physical Exam  Constitutional: She is oriented to person, place, and time. She appears well-developed and well-nourished.  HENT:  Head: Normocephalic and atraumatic.  Eyes: EOM are normal.  Neck: Normal range of motion.  Cardiovascular: Normal rate and regular rhythm.   Pulmonary/Chest: Effort normal and breath sounds normal. No respiratory distress. She has no wheezes. She has no rales.  Abdominal: Soft. Bowel sounds are normal. She exhibits no distension. There is no tenderness. There is no rebound.  Musculoskeletal: She exhibits no edema.  Neurological: She is alert and oriented to person, place, and time. Coordination normal.  Skin: Skin is warm and dry.  Psychiatric: She has a normal mood and affect.   Filed Vitals:   11/06/14 0806  BP: 102/70  Pulse: 68  Temp: 97.9 F (36.6 C)  TempSrc: Oral  Resp: 12  Height: 5\' 5"  (1.651 m)  Weight: 128 lb 12.8 oz (58.423 kg)  SpO2: 94%      Assessment & Plan:  Flu shot given at visit.

## 2014-11-06 NOTE — Assessment & Plan Note (Signed)
Checking TSH and free T4 to make sure she is not hypothyroid.

## 2014-11-07 LAB — HIV ANTIBODY (ROUTINE TESTING W REFLEX): HIV: NONREACTIVE

## 2014-11-07 LAB — HEPATITIS C ANTIBODY: HCV AB: NEGATIVE

## 2014-12-27 ENCOUNTER — Ambulatory Visit (AMBULATORY_SURGERY_CENTER): Payer: Self-pay | Admitting: *Deleted

## 2014-12-27 VITALS — Ht 65.0 in | Wt 128.0 lb

## 2014-12-27 DIAGNOSIS — Z1211 Encounter for screening for malignant neoplasm of colon: Secondary | ICD-10-CM

## 2014-12-27 MED ORDER — NA SULFATE-K SULFATE-MG SULF 17.5-3.13-1.6 GM/177ML PO SOLN
1.0000 | Freq: Once | ORAL | Status: DC
Start: 2014-12-27 — End: 2015-01-08

## 2014-12-27 NOTE — Progress Notes (Signed)
No egg or soy allergy. No anesthesia problems.  No home O2.  No diet meds.  

## 2015-01-08 ENCOUNTER — Ambulatory Visit (AMBULATORY_SURGERY_CENTER): Payer: Managed Care, Other (non HMO) | Admitting: Gastroenterology

## 2015-01-08 ENCOUNTER — Encounter: Payer: Self-pay | Admitting: Gastroenterology

## 2015-01-08 VITALS — BP 96/63 | HR 53 | Temp 96.5°F | Resp 16 | Ht 65.0 in | Wt 128.0 lb

## 2015-01-08 DIAGNOSIS — Z1211 Encounter for screening for malignant neoplasm of colon: Secondary | ICD-10-CM | POA: Diagnosis not present

## 2015-01-08 DIAGNOSIS — D122 Benign neoplasm of ascending colon: Secondary | ICD-10-CM

## 2015-01-08 DIAGNOSIS — D123 Benign neoplasm of transverse colon: Secondary | ICD-10-CM

## 2015-01-08 DIAGNOSIS — D126 Benign neoplasm of colon, unspecified: Secondary | ICD-10-CM

## 2015-01-08 HISTORY — PX: COLONOSCOPY: SHX174

## 2015-01-08 HISTORY — DX: Benign neoplasm of colon, unspecified: D12.6

## 2015-01-08 MED ORDER — SODIUM CHLORIDE 0.9 % IV SOLN: 500.0000 mL | INTRAVENOUS | Status: DC

## 2015-01-08 NOTE — Addendum Note (Signed)
Addended by: Evonnie Pat A on: 01/08/2015 05:23 PM   Modules accepted: Orders

## 2015-01-08 NOTE — Op Note (Addendum)
[facility]  [address] [place], [id number]   COLONOSCOPY PROCEDURE REPORT  PATIENT: [place], Zou  MR#: [id number] BIRTHDATE: [birth year] , 70  yrs. old GENDER: female ENDOSCOPIST: Ladene Artist, MD, [facility] REFERRED BY: Vertell Novak, M.D. PROCEDURE DATE:  01/08/2015 PROCEDURE:   Colonoscopy, screening and Colonoscopy with snare polypectomy First Screening Colonoscopy - Avg.  risk and is 50 yrs.  old or older Yes.  Prior Negative Screening - Now for repeat screening. N/A  History of Adenoma - Now for follow-up colonoscopy & has been > or = to 3 yrs.  N/A  Polyps removed today? Yes ASA CLASS:   Class II INDICATIONS:Screening for colonic neoplasia and Colorectal Neoplasm Risk Assessment for this procedure is average risk. MEDICATIONS: Monitored anesthesia care and Propofol 200 mg IV DESCRIPTION OF PROCEDURE:   After the risks benefits and alternatives of the procedure were thoroughly explained, informed consent was obtained.  The digital rectal exam revealed no abnormalities of the rectum.   The LB [id number]  endoscope was introduced through the anus and advanced to the cecum, which was identified by both the appendix and ileocecal valve. No adverse events experienced with a tortuous colon.   The quality of the prep was adequate (Suprep was used) after extensive rinsing and suctioning. The instrument was then slowly withdrawn as the colon was fully examined. Estimated blood loss is zero unless otherwise noted in this procedure report.  COLON FINDINGS: Four sessile polyps measuring 5-7 mm in size were found in the ascending colon.  Polypectomies were performed with a cold snare.  The resection was complete, the polyp tissue was completely retrieved and sent to histology.   A semi-pedunculated polyp measuring 10 mm in size was found in the transverse colon.  A polypectomy was performed using snare cautery.  The resection was complete, the polyp  tissue was completely retrieved and sent to histology.   The examination was otherwise normal.  Retroflexed views revealed no abnormalities. The time to cecum = 2.5 Withdrawal time = 16.9   The scope was withdrawn and the procedure completed. COMPLICATIONS: There were no immediate complications.  ENDOSCOPIC IMPRESSION: 1.   Four sessile polyps in the ascending colon; polypectomies performed with a cold snare 2.   Semi-pedunculated polyp in the transverse colon; polypectomy performed using snare cautery  RECOMMENDATIONS: 1.  Await pathology results 2.  Hold Aspirin and all other NSAIDS for 2 weeks. 3.  Repeat colonoscopy in 3 years 3-4 of the smaller polyps are precancerous or the largest is adenomatous; 5 years if 1-3 smaller polyps are precancerous; otherwise 10 years with a more extensive bowel prep.  eSigned:  Ladene Artist, MD, [facility] 01/08/2015 2:12 PM Revised: 01/08/2015 2:12 PM

## 2015-01-08 NOTE — Patient Instructions (Addendum)
  AVOID ASPIRIN,ASPIRIN PRODUCTS AND NSAIDS ( ADVIL, IBUPROFEN, MOTRIN, ALEVE ETC) FOR TWO WEEKS, January 22, 2015.   YOU HAD AN ENDOSCOPIC PROCEDURE TODAY AT [facility]:   Refer to the procedure report that was given to you for any specific questions about what was found during the examination.  If the procedure report does not answer your questions, please call your gastroenterologist to clarify.  If you requested that your care partner not be given the details of your procedure findings, then the procedure report has been included in a sealed envelope for you to review at your convenience later.  YOU SHOULD EXPECT: Some feelings of bloating in the abdomen. Passage of more gas than usual.  Walking can help get rid of the air that was put into your GI tract during the procedure and reduce the bloating. If you had a lower endoscopy (such as a colonoscopy or flexible sigmoidoscopy) you may notice spotting of blood in your stool or on the toilet paper. If you underwent a bowel prep for your procedure, you may not have a normal bowel movement for a few days.  Please Note:  You might notice some irritation and congestion in your nose or some drainage.  This is from the oxygen used during your procedure.  There is no need for concern and it should clear up in a day or so.  SYMPTOMS TO REPORT IMMEDIATELY:   Following lower endoscopy (colonoscopy or flexible sigmoidoscopy):  Excessive amounts of blood in the stool  Significant tenderness or worsening of abdominal pains  Swelling of the abdomen that is new, acute  Fever of 100F or higher   For urgent or emergent issues, a gastroenterologist can be reached at any hour by calling [phone].   DIET: Your first meal following the procedure should be a small meal and then it is ok to progress to your normal diet. Heavy or fried foods are harder to digest and may make you feel nauseous or bloated.  Likewise, meals heavy in dairy and  vegetables can increase bloating.  Drink plenty of fluids but you should avoid alcoholic beverages for 24 hours.  ACTIVITY:  You should plan to take it easy for the rest of today and you should NOT DRIVE or use heavy machinery until tomorrow (because of the sedation medicines used during the test).    FOLLOW UP: Our staff will call the number listed on your records the next business day following your procedure to check on you and address any questions or concerns that you may have regarding the information given to you following your procedure. If we do not reach you, we will leave a message.  However, if you are feeling well and you are not experiencing any problems, there is no need to return our call.  We will assume that you have returned to your regular daily activities without incident.  If any biopsies were taken you will be contacted by phone or by letter within the next 1-3 weeks.  Please call us at [phone] if you have not heard about the biopsies in 3 weeks.    SIGNATURES/CONFIDENTIALITY: You and/or your care partner have signed paperwork which will be entered into your electronic medical record.  These signatures attest to the fact that that the information above on your After Visit Summary has been reviewed and is understood.  Full responsibility of the confidentiality of this discharge information lies with you and/or your care-partner.

## 2015-01-08 NOTE — Progress Notes (Signed)
Report to PACU, RN, vss, BBS= Clear.  

## 2015-01-08 NOTE — Progress Notes (Signed)
Called to room to assist during endoscopic procedure.  Patient ID and intended procedure confirmed with present staff. Received instructions for my participation in the procedure from the performing physician.  

## 2015-01-09 ENCOUNTER — Telehealth: Payer: Self-pay

## 2015-01-09 NOTE — Telephone Encounter (Signed)
Follow up call following procedure 01-08-15. Left voice mail.

## 2015-01-10 ENCOUNTER — Telehealth: Payer: Self-pay | Admitting: Gastroenterology

## 2015-01-10 NOTE — Telephone Encounter (Signed)
Patient reports that she had a odd colored stool yesterday.  She reports that it looked like seaweed. She had a colonoscopy with polypectomy on 01/08/15.   She is assured that this was most likely dietary in nature and that  it sometimes takes a few weeks to re-establish normal bowel habits after a colonoscopy. Denies pain or other complaints.   She is asked to call back for any additional questions or concerns.

## 2015-01-14 ENCOUNTER — Encounter: Payer: Self-pay | Admitting: Gastroenterology

## 2015-01-21 ENCOUNTER — Telehealth: Payer: Self-pay | Admitting: Gastroenterology

## 2015-01-21 NOTE — Telephone Encounter (Signed)
All questions answered about path results.  She will call back for any additional questions or concerns

## 2015-02-06 ENCOUNTER — Other Ambulatory Visit: Payer: Managed Care, Other (non HMO)

## 2015-02-13 ENCOUNTER — Telehealth: Payer: Self-pay | Admitting: Internal Medicine

## 2015-02-13 NOTE — Telephone Encounter (Signed)
Would like fu call with results of UTI test

## 2015-02-13 NOTE — Telephone Encounter (Signed)
Do not see any test and has not been to our office since August. Maybe she had this done somewhere else and should call them?

## 2015-02-13 NOTE — Telephone Encounter (Signed)
Patient states she dropped off urine specimen to our lab on Friday per our instructions.

## 2015-02-14 NOTE — Telephone Encounter (Signed)
I do not see any documentation of that our orders placed.

## 2015-02-18 ENCOUNTER — Other Ambulatory Visit: Payer: Self-pay | Admitting: Geriatric Medicine

## 2015-02-18 ENCOUNTER — Telehealth: Payer: Self-pay | Admitting: Internal Medicine

## 2015-02-18 DIAGNOSIS — R300 Dysuria: Secondary | ICD-10-CM

## 2015-02-18 NOTE — Telephone Encounter (Signed)
I spoke with patient and she said someone here told her to drop of a urine sample to check for UTI. There were no orders, or notes in the chart. Patient says she still thinks she has a UTI. I told her she could drop off another sample and I would place the orders.

## 2015-02-18 NOTE — Telephone Encounter (Signed)
Patient called again upset that no one has returned her telephone call about the urine specimen she dropped off.

## 2015-02-18 NOTE — Telephone Encounter (Signed)
ERROR

## 2015-02-20 ENCOUNTER — Other Ambulatory Visit: Payer: Managed Care, Other (non HMO)

## 2015-02-20 DIAGNOSIS — R300 Dysuria: Secondary | ICD-10-CM

## 2015-02-21 LAB — UA/M W/RFLX CULTURE, COMP
BILIRUBIN UA: NEGATIVE
GLUCOSE, UA: NEGATIVE
KETONES UA: NEGATIVE
Leukocytes, UA: NEGATIVE
NITRITE UA: NEGATIVE
PROTEIN UA: NEGATIVE
RBC UA: NEGATIVE
SPEC GRAV UA: 1.014 (ref 1.005–1.030)
UUROB: 0.2 mg/dL (ref 0.2–1.0)
pH, UA: 5.5 (ref 5.0–7.5)

## 2015-02-21 LAB — MICROSCOPIC EXAMINATION: Casts: NONE SEEN /lpf

## 2015-02-25 ENCOUNTER — Telehealth: Payer: Self-pay | Admitting: Internal Medicine

## 2015-02-25 NOTE — Telephone Encounter (Signed)
Spoke with patient and discussed results

## 2015-02-25 NOTE — Telephone Encounter (Signed)
Pt request lab result that was done on 02/20/15. Please give her a call back

## 2015-06-12 ENCOUNTER — Ambulatory Visit (INDEPENDENT_AMBULATORY_CARE_PROVIDER_SITE_OTHER): Payer: Managed Care, Other (non HMO) | Admitting: Gastroenterology

## 2015-06-12 ENCOUNTER — Encounter: Payer: Self-pay | Admitting: Gastroenterology

## 2015-06-12 VITALS — BP 96/60 | HR 62 | Ht 65.0 in | Wt 125.5 lb

## 2015-06-12 DIAGNOSIS — R1084 Generalized abdominal pain: Secondary | ICD-10-CM

## 2015-06-12 DIAGNOSIS — Z86010 Personal history of colonic polyps: Secondary | ICD-10-CM

## 2015-06-12 DIAGNOSIS — K5900 Constipation, unspecified: Secondary | ICD-10-CM

## 2015-06-12 NOTE — Patient Instructions (Addendum)
You can use over the counter Miralax mixing 17 grams in 8 oz of water 1-3 x daily to titrate depending on your bowel movements.   Start over the [employer] daily.  Increase your water intake to at least 6-8 glasses of water daily.   You have been scheduled for an abdominal ultrasound at [facility] (1st floor of hospital) on 06/17/15 at 8:30am. Please arrive 15 minutes prior to your appointment for registration. Make certain not to have anything to eat or drink 6 hours prior to your appointment. Should you need to reschedule your appointment, please contact radiology at [phone]. This test typically takes about 30 minutes to perform.   Thank you for choosing me and [facility].  Pricilla Riffle. Dagoberto Ligas., MD., [facility] Regal

## 2015-06-12 NOTE — Progress Notes (Signed)
    History of Present Illness: This is a 60 year old female returning to address several concerns. She has had lifelong constipation which has gradually worsened over the past several years. MiraLAX as needed, stool softeners and due to lax tablets as needed have not been adequate to address her symptoms. She notes intermittent problems generalized abdominal pain that she feels are worse when she has more problems with constipation. She relates a cholecystectomy performed in 1980 and she was told of either some abnormality of a spot on her liver or pancreas. Recent LFTs are normal. No abdominal imaging studies listed in Epic which has records dated back to 2000. Patient has experienced a slight weight loss of 6 pounds over the past few months but states she's been busier and possibly eating less. She had an addition has questions about adenomatous polyps found that her colonoscopy in November 2016.  Current Medications, Allergies, Past Medical History, Past Surgical History, Family History and Social History were reviewed in [employer] record.  Physical Exam: General: Well developed, well nourished, no acute distress Head: Normocephalic and atraumatic Eyes:  sclerae anicteric, EOMI Ears: Normal auditory acuity Mouth: No deformity or lesions Lungs: Clear throughout to auscultation Heart: Regular rate and rhythm; no murmurs, rubs or bruits Abdomen: Soft, non tender and non distended. No masses, hepatosplenomegaly or hernias noted. Normal Bowel sounds Musculoskeletal: Symmetrical with no gross deformities  Pulses:  Normal pulses noted Extremities: No clubbing, cyanosis, edema or deformities noted Neurological: Alert oriented x 4, grossly nonfocal Psychological:  Alert and cooperative. Normal mood and affect  Assessment and Recommendations:  1. Chronic constipation, gradually worsening. Adequate daily water intake. High fiber diet. Benefiber daily. MiraLAX 1-3 capfuls per  day titrated by patient for adequate bowel movements.  2. Intermittent generalized abdominal pain which appears to correlate with constipation. Schedule abdominal ultrasound to evaluate for other causes.  3. Personal history of adenomatous colon polyps. Surveillance colonoscopy strategies and natural history of precancerous polyps discussed with patient. 3 year interval surveillance colonoscopy is recommended in November 2019.  I spent 25 minutes of face-to-face time with the patient. Greater than 50% of the time was spent counseling and coordinating care.

## 2015-06-17 ENCOUNTER — Ambulatory Visit (HOSPITAL_COMMUNITY)
Admission: RE | Admit: 2015-06-17 | Discharge: 2015-06-17 | Disposition: A | Discharge status: Home / Self Care | Payer: Managed Care, Other (non HMO) | Source: Ambulatory Visit | Attending: Gastroenterology | Admitting: Gastroenterology

## 2015-06-17 DIAGNOSIS — Z9049 Acquired absence of other specified parts of digestive tract: Secondary | ICD-10-CM | POA: Diagnosis not present

## 2015-06-17 DIAGNOSIS — R1084 Generalized abdominal pain: Secondary | ICD-10-CM | POA: Diagnosis not present

## 2015-08-01 ENCOUNTER — Ambulatory Visit: Payer: Managed Care, Other (non HMO) | Admitting: Family

## 2015-08-03 ENCOUNTER — Ambulatory Visit (INDEPENDENT_AMBULATORY_CARE_PROVIDER_SITE_OTHER): Payer: Managed Care, Other (non HMO) | Admitting: Emergency Medicine

## 2015-08-03 VITALS — BP 110/70 | HR 55 | Temp 97.9°F | Resp 17 | Ht 65.0 in | Wt 123.4 lb

## 2015-08-03 DIAGNOSIS — H9192 Unspecified hearing loss, left ear: Secondary | ICD-10-CM

## 2015-08-03 NOTE — Patient Instructions (Addendum)
Used Debrox drops in your right ear.    IF you received an x-ray today, you will receive an invoice from [facility]. Please contact [facility]iology at [phone] with questions or concerns regarding your invoice.   IF you received labwork today, you will receive an invoice from [employer]. Please contact [facility] at [phone] with questions or concerns regarding your invoice.   Our billing staff will not be able to assist you with questions regarding bills from these companies.  You will be contacted with the lab results as soon as they are available. The fastest way to get your results is to activate your My Chart account. Instructions are located on the last page of this paperwork. If you have not heard from [place] regarding the results in 2 weeks, please contact this office.

## 2015-08-03 NOTE — Progress Notes (Signed)
By signing my name below, I, Judithe Modest, attest that this documentation has been prepared under the direction and in the presence of Nena Jordan, MD. Electronically Signed: Judithe Modest, ER Scribe. 08/03/2015. 11:04 AM.  Chief Complaint:  Chief Complaint  Patient presents with  . Ear Fullness    right ear stopped up x 2 weeks-? wax impaction    HPI: Brandi Stevens is a 60 y.o. female who reports to [facility] today complaining of fullness in her left ear for the last two weeks. She has had trouble with wax buildup in the past.    Past Medical History  Diagnosis Date  . Graves disease 1980s    RAI in 2008, not on any thyroid replacement hormone  . Arthritis     Bilateral shoulders.  . Bradycardia     Exercise stress test normal 2008  . 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 Emphysema     Last PFTs 1980s, not on current inhaler thearpy  . Rotator cuff tear arthropathy     R cuff  . Osteoarthritis of shoulder     Bilateral arthritis/bursitis  . Diverticulitis 2012  . Tubular adenoma of colon 01/2015   Past Surgical History  Procedure Laterality Date  . Shoulder arthroscopy w/ superior labral anterior posterior lesion repair  right  . Abdominal hysterectomy  1989  . Rotator cuff repair      R shoulder [facility]2 Dr. [facility]leen [facility]  . Breast biopsy  12/22/2011    Procedure: BREAST BIOPSY;  Surgeon: Ralene Ok, MD;  Location: [place];  Service: General;  Laterality: Right;  Excision right breast mass  . Cholecystectomy  1989   Social History   Social History  . Marital Status: Divorced    Spouse Name: N/A  . Number of Children: 3  . 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 Smoking status: Former Smoker -- 1.00 packs/day for 20 years    Start date: 11/08/2011  . Smokeless tobacco: Never Used  . Alcohol Use: No  . Drug Use: No  . Sexual Activity: Not Currently    Birth  Control/ Protection: Post-menopausal   Other Topics Concern  . None   Social History Narrative   Live in [place], lives alone. Sons nearby.    Family History  Problem Relation Age of Onset  . Pancreatic cancer Mother 24    pancreatic  . Hearing loss Father   . Stroke Father 51    ischemic stroke  . Gastric cancer Maternal Aunt     gastric  . Colon cancer Maternal Grandfather 65   Allergies  Allergen Reactions  . Latex Anaphylaxis    Latex gloves/ skin itch   . Sulfa Antibiotics Shortness Of Breath and Itching    Also, wheezing   Prior to Admission medications   Medication Sig Start Date End Date Taking? Authorizing Provider  acetaminophen (TYLENOL) 650 MG CR tablet Take 650 mg by mouth every 8 (eight) hours as needed for pain.   Yes Historical Provider, MD  Calcium Citrate (CALCITRATE PO) Take 1 tablet by mouth daily.   Yes Historical Provider, MD  cetirizine (ZYRTEC) 10 MG chewable tablet Chew 10 mg by mouth daily.   Yes Historical Provider, MD  cholecalciferol (VITAMIN D) 1000 UNITS tablet Take 1,000 Units by mouth daily.   Yes Historical Provider, MD  diclofenac sodium (VOLTAREN) 1 % GEL Apply 2 g topically daily as needed. For shoulder pain per patient   Yes Historical Provider, MD  fluticasone (FLONASE) 50 MCG/ACT nasal spray Place into both nostrils daily.   Yes Historical Provider, MD  Multiple Vitamins-Minerals (CENTRUM SILVER PO) Take 1 tablet by mouth daily.   Yes Historical Provider, MD  olopatadine (PATANOL) 0.1 % ophthalmic solution 1 drop 2 (two) times daily.   Yes Historical Provider, MD     ROS: The patient denies fevers, chills, night sweats, unintentional weight loss, chest pain, palpitations, wheezing, dyspnea on exertion, nausea, vomiting, abdominal pain, dysuria, hematuria, melena, numbness, weakness, or tingling.   All other systems have been reviewed and were otherwise negative with the exception of those mentioned in the HPI and as above.     PHYSICAL EXAM: Filed Vitals:   08/03/15 1014  BP: 110/70  Pulse: 55  Temp: 97.9 F (36.6 C)  Resp: 17   Body mass index is 20.53 kg/(m^2).   General: Alert, no acute distress HEENT:  Normocephalic, atraumatic, oropharynx patent.  Eye: Juliette Mangle [facility] Cardiovascular:  Regular rate and rhythm, no rubs murmurs or gallops.  No Carotid bruits, radial pulse intact. No pedal edema.  Respiratory: Clear to auscultation bilaterally.  No wheezes, rales, or rhonchi.  No cyanosis, no use of accessory musculature Abdominal: No organomegaly, abdomen is soft and non-tender, positive bowel sounds.  No masses. Musculoskeletal: Gait intact. No edema, tenderness Skin: No rashes. Neurologic: Facial musculature symmetric. Psychiatric: Patient acts appropriately throughout our interaction. Lymphatic: No cervical or submandibular lymphadenopathy    LABS:    EKG/XRAY:   Primary read interpreted by Dr. Everlene Farrier at [place].   ASSESSMENT/PLAN: We were able to remove the wax from the left ear but she has residual wax in the right ear she will treat with the debrox.I personally performed the services described in this documentation, which was scribed in my presence. The recorded information has been reviewed and is accurate.   Gross sideeffects, risk and benefits, and alternatives of medications d/w patient. Patient is aware that all medications have potential sideeffects and we are unable to predict every sideeffect or drug-drug interaction that may occur.  Arlyss Queen MD 08/03/2015 11:04 AM

## 2016-06-01 ENCOUNTER — Encounter: Payer: Managed Care, Other (non HMO) | Admitting: Internal Medicine

## 2016-09-26 ENCOUNTER — Ambulatory Visit (INDEPENDENT_AMBULATORY_CARE_PROVIDER_SITE_OTHER): Payer: BLUE CROSS/BLUE SHIELD | Admitting: Family Medicine

## 2016-09-26 ENCOUNTER — Encounter: Payer: Self-pay | Admitting: Family Medicine

## 2016-09-26 VITALS — BP 109/64 | HR 55 | Temp 98.6°F | Resp 16 | Ht 64.0 in | Wt 120.6 lb

## 2016-09-26 DIAGNOSIS — S43422S Sprain of left rotator cuff capsule, sequela: Secondary | ICD-10-CM

## 2016-09-26 DIAGNOSIS — R109 Unspecified abdominal pain: Secondary | ICD-10-CM | POA: Diagnosis not present

## 2016-09-26 DIAGNOSIS — S39012A Strain of muscle, fascia and tendon of lower back, initial encounter: Secondary | ICD-10-CM

## 2016-09-26 LAB — POCT URINALYSIS DIP (MANUAL ENTRY)
BILIRUBIN UA: NEGATIVE
GLUCOSE UA: NEGATIVE mg/dL
Ketones, POC UA: NEGATIVE mg/dL
Leukocytes, UA: NEGATIVE
NITRITE UA: NEGATIVE
PH UA: 5 (ref 5.0–8.0)
Protein Ur, POC: NEGATIVE mg/dL
Urobilinogen, UA: 0.2 E.U./dL

## 2016-09-26 LAB — POC MICROSCOPIC URINALYSIS (UMFC): Mucus: ABSENT

## 2016-09-26 MED ORDER — DICLOFENAC SODIUM 1 % TD GEL: 2.0000 g | Freq: Every day | TRANSDERMAL | 1 refills | Status: AC | PRN

## 2016-09-26 MED ORDER — METHOCARBAMOL 500 MG PO TABS: 500.0000 mg | ORAL_TABLET | Freq: Four times a day (QID) | ORAL | 0 refills | Status: DC

## 2016-09-26 NOTE — Progress Notes (Signed)
Patient ID: Brandi Stevens, female    DOB: 1955/11/30  Age: 61 y.o. MRN: [id number]  Chief Complaint  Patient presents with  . 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 She was straightening some cushions at home over a week ago and not doing any heavy physical activity but she turned in her back had acute pain in the right flank area that went down her leg. That has subsided, but she persists with right flank pain, especially if she turns certain ways. Has not had a lot of back pain problems in the past. Pain is not radiating now. She has not had any dysuria, but she wasn't certain she didn't have any kidney infection.  For a long time she has had problems with her right shoulder hurting. She had a torn rotator cuff. They have contemplated surgery on it but has not done so at this time. She has (gel which she uses intermittently for that that helps considerably. She requests a refill of it as she continues to try to delay the option of surgery.  She is on allergy medicines vitamin D and multivitamins. She has been fairly healthy.  Current allergies, medications, problem list, past/family and social histories reviewed.  Objective:  BP 109/64   Pulse (!) 55   Temp 98.6 F (37 C) (Oral)   Resp 16   Ht 5\' 4"  (1.626 m)   Wt 120 lb 9.6 oz (54.7 kg)   SpO2 99%   BMI 20.70 kg/m   Pleasant lady, healthy appearance, alert and oriented. No CVA tenderness. On flexion she develops pain in the right flank region as she does a lateral tilt. Straight leg raising test is negative and deep tendon reflexes 1 to plus. Abdomen benign.  Results for orders placed or performed in visit on 09/26/16  POCT urinalysis dipstick  Result Value Ref Range   Color, UA yellow yellow   Clarity, UA clear clear   Glucose, UA negative negative mg/dL   Bilirubin, UA negative negative   Ketones, POC UA negative negative mg/dL   Spec Grav, UA >=1.030 (A) 1.010 - 1.025    Blood, UA trace-intact (A) negative   pH, UA 5.0 5.0 - 8.0   Protein Ur, POC negative negative mg/dL   Urobilinogen, UA 0.2 0.2 or 1.0 E.U./dL   Nitrite, UA Negative Negative   Leukocytes, UA Negative Negative  POCT Microscopic Urinalysis ([facility])  Result Value Ref Range   WBC,UR,HPF,POC Few (A) None WBC/hpf   RBC,UR,HPF,POC None None RBC/hpf   Bacteria Many (A) None, Too numerous to count   Mucus Absent Absent   Epithelial Cells, UR Per Microscopy Few (A) None, Too numerous to count cells/hpf     Assessment & Plan:   Assessment: 1. Low back strain, initial encounter   2. Acute right flank pain   3. Sprain of left rotator cuff capsule, sequela       Plan: We'll check her urinalysis, but this is very likely lumbar strain. See instructions.  We'll continue her on the medicine for the shoulder.  Orders Placed This Encounter  Procedures  . POCT urinalysis dipstick  . POCT Microscopic Urinalysis ([facility])    Meds ordered this encounter  Medications  . diclofenac sodium (VOLTAREN) 1 % GEL    Sig: Apply 2 g topically daily as needed. For shoulder pain per patient    Dispense:  6 Tube  Refill:  1  . methocarbamol (ROBAXIN) 500 MG tablet    Sig: Take 1 tablet (500 mg total) by mouth 4 (four) times daily.    Dispense:  40 tablet    Refill:  0     Results for orders placed or performed in visit on 09/26/16  POCT urinalysis dipstick  Result Value Ref Range   Color, UA yellow yellow   Clarity, UA clear clear   Glucose, UA negative negative mg/dL   Bilirubin, UA negative negative   Ketones, POC UA negative negative mg/dL   Spec Grav, UA >=1.030 (A) 1.010 - 1.025   Blood, UA trace-intact (A) negative   pH, UA 5.0 5.0 - 8.0   Protein Ur, POC negative negative mg/dL   Urobilinogen, UA 0.2 0.2 or 1.0 E.U./dL   Nitrite, UA Negative Negative   Leukocytes, UA Negative Negative  POCT Microscopic Urinalysis ([facility])  Result Value Ref Range   WBC,UR,HPF,POC Few (A) None  WBC/hpf   RBC,UR,HPF,POC None None RBC/hpf   Bacteria Many (A) None, Too numerous to count   Mucus Absent Absent   Epithelial Cells, UR Per Microscopy Few (A) None, Too numerous to count cells/hpf       Patient Instructions   Take methocarbamol (Robaxin) muscle relaxant 500 mg 1 tablet in the morning, 1 in the afternoon, and 2 at bedtime as needed.  Take Tylenol for pain, back some of 1000 mg 3 times daily  Take ibuprofen 200 mg 3 pills 2-3 times daily if needed for worse pain, only on an as-needed basis  Return if worse or not improving  We recommend that you schedule a mammogram for breast cancer screening. Typically, you do not need a referral to do this. Please contact a local imaging center to schedule your mammogram.  [facility] - [phone]  *ask for the Radiology Department [facility] [place] [facility] - [phone] or [phone]  [facility] - [phone] [place] [phone] [facility] - [phone]  *ask for the [facility] - [phone]  *ask for the Radiology Department [facility] - [phone]  *ask for the [place] - [phone]    IF you received an x-ray today, you will receive an invoice from [facility]iology. Please contact [facility] Radiology at [phone] with questions or concerns regarding your invoice.   IF you received labwork today, you will receive an invoice from [facility]. Please contact [facility] at [phone] with questions or concerns regarding your invoice.   Our billing staff will not be able to assist you with questions regarding bills from these companies.  You will be contacted with the lab results as soon as they are available. The fastest way to get your results is to activate your My Chart account. Instructions are located on the last page of this paperwork. If you have  not heard from [place] regarding the results in 2 weeks, please contact this office.        Return if symptoms worsen or fail to improve.   Jehieli Brassell, MD 09/26/2016

## 2016-09-26 NOTE — Patient Instructions (Addendum)
Take methocarbamol (Robaxin) muscle relaxant 500 mg 1 tablet in the morning, 1 in the afternoon, and 2 at bedtime as needed.  Take Tylenol for pain, back some of 1000 mg 3 times daily  Take ibuprofen 200 mg 3 pills 2-3 times daily if needed for worse pain, only on an as-needed basis  Return if worse or not improving  We recommend that you schedule a mammogram for breast cancer screening. Typically, you do not need a referral to do this. Please contact a local imaging center to schedule your mammogram.  [facility] - [phone]  *ask for the Radiology Department The [place] ([place]) - [phone] or [phone]  [facility] - [phone] [place] [phone] [facility] - [phone]  *ask for the [facility] - [phone]  *ask for the Radiology Department [facility] - [phone]  *ask for the [place] - [phone]    IF you received an x-ray today, you will receive an invoice from [facility] Radiology. Please contact [facility] Radiology at [phone] with questions or concerns regarding your invoice.   IF you received labwork today, you will receive an invoice from [facility]. Please contact [facility] at [phone] with questions or concerns regarding your invoice.   Our billing staff will not be able to assist you with questions regarding bills from these companies.  You will be contacted with the lab results as soon as they are available. The fastest way to get your results is to activate your My Chart account. Instructions are located on the last page of this paperwork. If you have not heard from [place] regarding the results in 2 weeks, please contact this office.

## 2016-10-01 ENCOUNTER — Telehealth: Payer: Self-pay

## 2016-10-01 NOTE — Telephone Encounter (Signed)
PA Started for Diclofenac Gel Additional Information Required Awaiting Fax

## 2016-10-05 NOTE — Telephone Encounter (Signed)
Patient was previously using diclofenac gel on her shoulder and it did well.  I prescribed her some, but it is apparently denied.  Check with patient if she has contacted her insurance company on this, and if we need to make an appeal.  Ruben Reason

## 2016-10-05 NOTE — Telephone Encounter (Signed)
Diclofenac Gel Denied Awaiting Adverse Benefit Determination Fax

## 2016-10-06 NOTE — Telephone Encounter (Signed)
Called and spoke with patient. She has not contacted her insurance company. States Diclofenac will cost $50 out of pocket.   I called insurance company. They will resend denial information. An appeal will take up to one month per insurance, a peer to peer will be quicker. Awaiting Fax

## 2016-10-06 NOTE — Telephone Encounter (Signed)
Form placed in box

## 2016-10-07 NOTE — Telephone Encounter (Signed)
I cannot find the form.

## 2016-10-08 ENCOUNTER — Telehealth: Payer: Self-pay

## 2016-10-08 NOTE — Telephone Encounter (Signed)
PT has been advised that ins. Denied voltaren gel. Pt would like to know if there is something else that she take.

## 2016-10-09 MED ORDER — MELOXICAM 7.5 MG PO TABS: 7.5000 mg | ORAL_TABLET | Freq: Every day | ORAL | 0 refills | Status: DC

## 2016-10-09 NOTE — Telephone Encounter (Signed)
Call patient: Please prescribe for he Mobic 7.5 #30 (no refills), take one daily with food for shoulder pain.  If not helping can try increasing to twice daily.  If symptoms persist please return

## 2016-10-09 NOTE — Telephone Encounter (Signed)
Called and spoke with patient and advised that Mobic 7.5mg  was ordered and that was sent to [facility] in [place].

## 2017-03-09 HISTORY — PX: COLONOSCOPY: SHX174

## 2017-03-09 HISTORY — PX: POLYPECTOMY: SHX149

## 2017-03-19 ENCOUNTER — Encounter: Payer: Self-pay | Admitting: Family Medicine

## 2017-03-19 ENCOUNTER — Ambulatory Visit (INDEPENDENT_AMBULATORY_CARE_PROVIDER_SITE_OTHER): Payer: 59 | Admitting: Family Medicine

## 2017-03-19 ENCOUNTER — Other Ambulatory Visit: Payer: Self-pay

## 2017-03-19 VITALS — BP 122/74 | HR 67 | Temp 98.9°F | Ht 64.0 in | Wt 122.8 lb

## 2017-03-19 DIAGNOSIS — R309 Painful micturition, unspecified: Secondary | ICD-10-CM

## 2017-03-19 DIAGNOSIS — Z23 Encounter for immunization: Secondary | ICD-10-CM

## 2017-03-19 DIAGNOSIS — M2042 Other hammer toe(s) (acquired), left foot: Secondary | ICD-10-CM

## 2017-03-19 LAB — POCT URINALYSIS DIP (MANUAL ENTRY)
Bilirubin, UA: NEGATIVE
Glucose, UA: NEGATIVE mg/dL
Ketones, POC UA: NEGATIVE mg/dL
Nitrite, UA: NEGATIVE
Protein Ur, POC: NEGATIVE mg/dL
Spec Grav, UA: 1.02 (ref 1.010–1.025)
Urobilinogen, UA: 0.2 E.U./dL
pH, UA: 5.5 (ref 5.0–8.0)

## 2017-03-19 NOTE — Progress Notes (Signed)
1/11/201911:21 AM  Brandi Stevens 10/21/1955, 62 y.o. female [id number]  Chief Complaint  Patient presents with  . Urinary Tract Infection    burning and polyuria  . Pain    on left foot, 2 middle toes have been giving her pain for 2 years.     HPI:   Patient is a 62 y.o. female who presents today for about 2 weeks of intermittent burning with urination, sometimes a strong odor. No blood, no urgency or increased frequency. No nausea or vomiting. No fever or chills. She used to get recurring UTIs but it has been a while since she had one. She has been drinking cranberry juice.   Otherwise, she is complaining of 2 years of 2nd and 3rd toe pain, base of toes, radiates to the tips. No trauma, wears wide shoes.   Depression screen [facility] 2/9 03/19/2017 09/26/2016 08/03/2015  Decreased Interest 0 0 0  Down, Depressed, Hopeless 0 0 0  PHQ - 2 Score 0 0 0    Allergies  Allergen Reactions  . Latex Anaphylaxis    Latex gloves/ skin itch   . Sulfa Antibiotics Shortness Of Breath and Itching    Also, wheezing    Prior to Admission medications   Medication Sig Start Date End Date Taking? Authorizing Provider  acetaminophen (TYLENOL) 650 MG CR tablet Take 650 mg by mouth every 8 (eight) hours as needed for pain.   Yes [provider]  Calcium Citrate (CALCITRATE PO) Take 1 tablet by mouth daily.   Yes [provider]  cetirizine (ZYRTEC) 10 MG chewable tablet Chew 10 mg by mouth daily.   Yes [provider]  cholecalciferol (VITAMIN D) 1000 UNITS tablet Take 1,000 Units by mouth daily.   Yes [provider]  diclofenac sodium (VOLTAREN) 1 % GEL Apply 2 g topically daily as needed. For shoulder pain per patient 09/26/16  Yes Posey Boyer, MD  fluticasone [facility]) 50 MCG/ACT nasal spray Place into both nostrils daily.   Yes [provider]  Multiple Vitamins-Minerals (CENTRUM SILVER PO) Take 1 tablet by mouth daily.   Yes [provider]  olopatadine (PATANOL) 0.1 % ophthalmic solution 1 drop 2 (two) times daily.   Yes [provider]    Past Medical History:  Diagnosis Date  . Arthritis    Bilateral shoulders.  . Bradycardia    Exercise stress test normal 2008  . Diverticulitis 2012  . Emphysema    Last PFTs 1980s, not on current inhaler thearpy  . 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 Graves disease 1980s   RAI in 2008, not on any thyroid replacement hormone  . Osteoarthritis of shoulder    Bilateral arthritis/bursitis  . Rotator cuff tear arthropathy    R cuff  . Tubular adenoma of colon 01/2015    Past Surgical History:  Procedure Laterality Date  . ABDOMINAL HYSTERECTOMY  1989  . BREAST BIOPSY  12/22/2011   Procedure: BREAST BIOPSY;  Surgeon: Ralene Ok, MD;  Location: [place];  Service: General;  Laterality: Right;  Excision right breast mass  . CHOLECYSTECTOMY  1989  . ROTATOR CUFF REPAIR     R shoulder x2 Dr. [facility]leen [facility]os [facility]  . SHOULDER ARTHROSCOPY W/ SUPERIOR LABRAL ANTERIOR POSTERIOR LESION REPAIR  right    Social History   Tobacco Use  . Smoking status: Former Smoker    Packs/day: 1.00    Years: 20.00    Pack years: 20.00  Start date: 11/08/2011  . Smokeless tobacco: Never Used  Substance Use Topics  . Alcohol use: No    Alcohol/week: 0.0 oz    Family History  Problem Relation Age of Onset  . Pancreatic cancer Mother 69       pancreatic  . Hearing loss Father   . Stroke Father 37       ischemic stroke  . Colon cancer Maternal Grandfather 20  . Gastric cancer Maternal Aunt        gastric    ROS Per HPI  OBJECTIVE:  Blood pressure 122/74, pulse 67, temperature 98.9 F (37.2 C), temperature source Oral, height 5\' 4"  (1.626 m), weight 122 lb 12.8 oz (55.7 kg), SpO2 94 %.  Physical Exam  Constitutional: She is oriented to person, place, and time and well-developed, well-nourished, and in no distress.  HENT:  Head: Normocephalic and  atraumatic.  Mouth/Throat: Mucous membranes are normal.  Eyes: EOM are normal. Pupils are equal, round, and reactive to light. No scleral icterus.  Neck: Neck supple.  Pulmonary/Chest: Effort normal.  Abdominal: There is no CVA tenderness.  Musculoskeletal:  Left foot: FROM, no swelling , erythema or warmth, no nodules palpated. Sensation and pulses intact. 2nd and 3rd toes mild hammer toes, TTP at base of phalanges. Nails unremarkable.  Neurological: She is alert and oriented to person, place, and time. Gait normal.  Skin: Skin is warm and dry.  Psychiatric: Mood and affect normal.  Nursing note and vitals reviewed.    Results for orders placed or performed in visit on 03/19/17 (from the past 24 hour(s))  POCT urinalysis dipstick     Status: Abnormal   Collection Time: 03/19/17 11:06 AM  Result Value Ref Range   Color, UA yellow yellow   Clarity, UA clear clear   Glucose, UA negative negative mg/dL   Bilirubin, UA negative negative   Ketones, POC UA negative negative mg/dL   Spec Grav, UA 1.020 1.010 - 1.025   Blood, UA small (A) negative   pH, UA 5.5 5.0 - 8.0   Protein Ur, POC negative negative mg/dL   Urobilinogen, UA 0.2 0.2 or 1.0 E.U./dL   Nitrite, UA Negative Negative   Leukocytes, UA Trace (A) Negative     ASSESSMENT and PLAN  1. Painful urination UA nor presentation highly suggestive of UTI, discussed pushing fluids, avoiding bladder irritants, sending for culture. RTC precautions discussed.  - POCT urinalysis dipstick - Urine Culture  2. Need for prophylactic vaccination and inoculation against influenza - Flu Vaccine QUAD 36+ mos IM  3. Hammer toe of left foot Not interested in seeing a surgeon at this time. Discussed supportive measures.   Return if symptoms worsen or fail to improve.    Rutherford Guys, MD [facility] [place], [place] [address] Ph.  [phone] Fax [phone]

## 2017-03-19 NOTE — Patient Instructions (Addendum)
IF you received an x-ray today, you will receive an invoice from [facility]. Please contact [facility] at [phone] with questions or concerns regarding your invoice.   IF you received labwork today, you will receive an invoice from [place]. Please contact [facility] at [phone] with questions or concerns regarding your invoice.   Our billing staff will not be able to assist you with questions regarding bills from these companies.  You will be contacted with the lab results as soon as they are available. The fastest way to get your results is to activate your My Chart account. Instructions are located on the last page of this paperwork. If you have not heard from [place] regarding the results in 2 weeks, please contact this office.     Hammer Toe Hammer toe is a change in the shape (a deformity) of your second, third, or fourth toe. The deformity causes the middle joint of your toe to stay bent. This causes pain, especially when you are wearing shoes. Hammer toe starts gradually. At first, the toe can be straightened. Gradually over time, the deformity becomes stiff and permanent. Early treatments to keep the toe straight may relieve pain. As the deformity becomes stiff and permanent, surgery may be needed to straighten the toe. What are the causes? Hammer toe is caused by abnormal bending of the toe joint that is closest to your foot. It happens gradually over time. This pulls on the muscles and connections (tendons) of the toe joint, making them weak and stiff. It is often related to wearing shoes that are too short or narrow and do not let your toes straighten. What increases the risk? You may be at greater risk for hammer toe if you:  Are female.  Are older.  Wear shoes that are too small.  Wear high-heeled shoes that pinch your toes.  Are a Engineer, mining.  Have a second toe that is longer than your big toe (first toe).  Injure your foot or toe.  Have  arthritis.  Have a family history of hammer toe.  Have a nerve or muscle disorder.  What are the signs or symptoms? The main symptoms of this condition are pain and deformity of the toe. The pain is worse when wearing shoes, walking, or running. Other symptoms may include:  Corns or calluses over the bent part of the toe or between the toes.  Redness and a burning feeling on the toe.  An open sore that forms on the top of the toe.  Not being able to straighten the toe.  How is this diagnosed? This condition is diagnosed based on your symptoms and a physical exam. During the exam, your health care provider will try to straighten your toe to see how stiff the deformity is. You may also have tests, such as:  A blood test to check for rheumatoid arthritis.  An X-ray to show how severe the deformity is.  How is this treated? Treatment for this condition will depend on how stiff the deformity is. Surgery is often needed. However, sometimes a hammer toe can be straightened without surgery. Treatments that do not involve surgery include:  Taping the toe into a straightened position.  Using pads and cushions to protect the toe (orthotics).  Wearing shoes that provide enough room for the toes.  Doing toe-stretching exercises at home.  Taking an NSAID to reduce pain and swelling.  If these treatments do not help or the toe cannot be straightened, surgery is  the next option. The most common surgeries used to straighten a hammer toe include:  Arthroplasty. In this procedure, part of the joint is removed, and that allows the toe to straighten.  Fusion. In this procedure, cartilage between the two bones of the joint is taken out and the bones are fused together into one longer bone.  Implantation. In this procedure, part of the bone is removed and replaced with an implant to let the toe move again.  Flexor tendon transfer. In this procedure, the tendons that curl the toes down (flexor  tendons) are repositioned.  Follow these instructions at home:  Take over-the-counter and prescription medicines only as told by your health care provider.  Do toe straightening and stretching exercises as told by your health care provider.  Keep all follow-up visits as told by your health care provider. This is important. How is this prevented?  Wear shoes that give your toes enough room and do not cause pain.  Do not wear high-heeled shoes. Contact a health care provider if:  Your pain gets worse.  Your toe becomes red or swollen.  You develop an open sore on your toe. This information is not intended to replace advice given to you by your health care provider. Make sure you discuss any questions you have with your health care provider. Document Released: 02/21/2000 Document Revised: 09/13/2015 Document Reviewed: 06/19/2015 Elsevier Interactive Patient Education  [employer].

## 2017-03-20 LAB — URINE CULTURE: Organism ID, Bacteria: NO GROWTH

## 2017-03-22 NOTE — Progress Notes (Signed)
Spoke with pt regarding her results. All is resolved

## 2017-04-27 ENCOUNTER — Telehealth: Payer: Self-pay | Admitting: Family Medicine

## 2017-04-27 NOTE — Telephone Encounter (Signed)
Called pt to reschedule her appt with Dr. Pamella Pert she had for 05/21/17. We were able to reschedule pt for 04/29/17 at 8:00 AM with Dr. Pamella Pert. Made pt aware of building # and time policies.  Thanks!

## 2017-04-29 ENCOUNTER — Encounter: Payer: 59 | Admitting: Family Medicine

## 2017-04-30 ENCOUNTER — Encounter: Payer: Self-pay | Admitting: Family Medicine

## 2017-04-30 ENCOUNTER — Ambulatory Visit (INDEPENDENT_AMBULATORY_CARE_PROVIDER_SITE_OTHER): Payer: 59 | Admitting: Family Medicine

## 2017-04-30 ENCOUNTER — Other Ambulatory Visit: Payer: Self-pay

## 2017-04-30 VITALS — BP 132/60 | HR 68 | Temp 98.3°F | Ht 65.0 in | Wt 121.0 lb

## 2017-04-30 DIAGNOSIS — D126 Benign neoplasm of colon, unspecified: Secondary | ICD-10-CM

## 2017-04-30 DIAGNOSIS — Z8639 Personal history of other endocrine, nutritional and metabolic disease: Secondary | ICD-10-CM

## 2017-04-30 DIAGNOSIS — Z1231 Encounter for screening mammogram for malignant neoplasm of breast: Secondary | ICD-10-CM | POA: Diagnosis not present

## 2017-04-30 DIAGNOSIS — Z0000 Encounter for general adult medical examination without abnormal findings: Secondary | ICD-10-CM

## 2017-04-30 DIAGNOSIS — M8580 Other specified disorders of bone density and structure, unspecified site: Secondary | ICD-10-CM

## 2017-04-30 NOTE — Patient Instructions (Addendum)
   IF you received an x-ray today, you will receive an invoice from [facility]. Please contact [facility] at [phone] with questions or concerns regarding your invoice.   IF you received labwork today, you will receive an invoice from [facility]. Please contact [facility] at [phone] with questions or concerns regarding your invoice.   Our billing staff will not be able to assist you with questions regarding bills from these companies.  You will be contacted with the lab results as soon as they are available. The fastest way to get your results is to activate your My Chart account. Instructions are located on the last page of this paperwork. If you have not heard from us regarding the results in 2 weeks, please contact this office.     Preventive Care 40-64 Years, Female Preventive care refers to lifestyle choices and visits with your health care provider that can promote health and wellness. What does preventive care include?  A yearly physical exam. This is also called an annual well check.  Dental exams once or twice a year.  Routine eye exams. Ask your health care provider how often you should have your eyes checked.  Personal lifestyle choices, including: ? Daily care of your teeth and gums. ? Regular physical activity. ? Eating a healthy diet. ? Avoiding tobacco and drug use. ? Limiting alcohol use. ? Practicing safe sex. ? Taking low-dose aspirin daily starting at age 50. ? Taking vitamin and mineral supplements as recommended by your health care provider. What happens during an annual well check? The services and screenings done by your health care provider during your annual well check will depend on your age, overall health, lifestyle risk factors, and family history of disease. Counseling Your health care provider may ask you questions about your:  Alcohol use.  Tobacco use.  Drug use.  Emotional well-being.  Home and relationship  well-being.  Sexual activity.  Eating habits.  Work and work environment.  Method of birth control.  Menstrual cycle.  Pregnancy history.  Screening You may have the following tests or measurements:  Height, weight, and BMI.  Blood pressure.  Lipid and cholesterol levels. These may be checked every 5 years, or more frequently if you are over 50 years old.  Skin check.  Lung cancer screening. You may have this screening every year starting at age 55 if you have a 30-pack-year history of smoking and currently smoke or have quit within the past 15 years.  Fecal occult blood test (FOBT) of the stool. You may have this test every year starting at age 50.  Flexible sigmoidoscopy or colonoscopy. You may have a sigmoidoscopy every 5 years or a colonoscopy every 10 years starting at age 50.  Hepatitis C blood test.  Hepatitis B blood test.  Sexually transmitted disease (STD) testing.  Diabetes screening. This is done by checking your blood sugar (glucose) after you have not eaten for a while (fasting). You may have this done every 1-3 years.  Mammogram. This may be done every 1-2 years. Talk to your health care provider about when you should start having regular mammograms. This may depend on whether you have a family history of breast cancer.  BRCA-related cancer screening. This may be done if you have a family history of breast, ovarian, tubal, or peritoneal cancers.  Pelvic exam and Pap test. This may be done every 3 years starting at age 21. Starting at age 30, this may be done every 5 years if you   have a Pap test in combination with an HPV test.  Bone density scan. This is done to screen for osteoporosis. You may have this scan if you are at high risk for osteoporosis.  Discuss your test results, treatment options, and if necessary, the need for more tests with your health care provider. Vaccines Your health care provider may recommend certain vaccines, such  as:  Influenza vaccine. This is recommended every year.  Tetanus, diphtheria, and acellular pertussis (Tdap, Td) vaccine. You may need a Td booster every 10 years.  Varicella vaccine. You may need this if you have not been vaccinated.  Zoster vaccine. You may need this after age 60.  Measles, mumps, and rubella (MMR) vaccine. You may need at least one dose of MMR if you were born in 1957 or later. You may also need a second dose.  Pneumococcal 13-valent conjugate (PCV13) vaccine. You may need this if you have certain conditions and were not previously vaccinated.  Pneumococcal polysaccharide (PPSV23) vaccine. You may need one or two doses if you smoke cigarettes or if you have certain conditions.  Meningococcal vaccine. You may need this if you have certain conditions.  Hepatitis A vaccine. You may need this if you have certain conditions or if you travel or work in places where you may be exposed to hepatitis A.  Hepatitis B vaccine. You may need this if you have certain conditions or if you travel or work in places where you may be exposed to hepatitis B.  Haemophilus influenzae type b (Hib) vaccine. You may need this if you have certain conditions.  Talk to your health care provider about which screenings and vaccines you need and how often you need them. This information is not intended to replace advice given to you by your health care provider. Make sure you discuss any questions you have with your health care provider. Document Released: 03/22/2015 Document Revised: 11/13/2015 Document Reviewed: 12/25/2014 Elsevier Interactive Patient Education  2018 Elsevier Inc.  

## 2017-04-30 NOTE — Progress Notes (Signed)
2/22/20199:59 AM  Brandi Stevens 04-Apr-1955, 62 y.o. female [id number]  Chief Complaint  Patient presents with  . Annual Exam    wellness fitnes    HPI:   Patient is a 62 y.o. female with past medical history significant for graves s/p RAI, tubular adenomas and osteopenia who presents today for CPE  Cervical Cancer Screening: n/a s/p partial hyst for fibroids Breast Cancer Screening: 2013, h/o right fibroadenoma Colorectal Cancer Screening: 01/2015, tubular adenomas, repeat in 3 years Bone Density Testing: 2010, osteopenia, mod FRAX HIV Screening: 01/2015 Seasonal Influenza Vaccination: 03/2017 Td/Tdap Vaccination: 03/2012 Pneumococcal Vaccination: n/a Zoster Vaccination: declines Frequency of Dental evaluation: 6 months Frequency of Eye evaluation: once a year, wears glasses  Depression screen [facility] 2/9 04/30/2017 03/19/2017 09/26/2016  Decreased Interest 0 0 0  Down, Depressed, Hopeless 0 0 0  PHQ - 2 Score 0 0 0    Allergies  Allergen Reactions  . Latex Anaphylaxis    Latex gloves/ skin itch   . Sulfa Antibiotics Shortness Of Breath and Itching    Also, wheezing    Prior to Admission medications   Medication Sig Start Date End Date Taking? Authorizing Provider  acetaminophen (TYLENOL) 650 MG CR tablet Take 650 mg by mouth every 8 (eight) hours as needed for pain.   Yes [provider]  Calcium Citrate (CALCITRATE PO) Take 1 tablet by mouth daily.   Yes [provider]  cetirizine (ZYRTEC) 10 MG chewable tablet Chew 10 mg by mouth daily.   Yes [provider]  diclofenac sodium (VOLTAREN) 1 % GEL Apply 2 g topically daily as needed. For shoulder pain per patient 09/26/16  Yes Posey Boyer, MD  cholecalciferol (VITAMIN D) 1000 UNITS tablet Take 1,000 Units by mouth daily.    [provider]  fluticasone (FLONASE) 50 MCG/ACT nasal spray Place into both nostrils daily.    [provider]  Multiple Vitamins-Minerals (CENTRUM  SILVER PO) Take 1 tablet by mouth daily.    [provider]  olopatadine (PATANOL) 0.1 % ophthalmic solution 1 drop 2 (two) times daily.    [provider]    Past Medical History:  Diagnosis Date  . Arthritis    Bilateral shoulders.  . Bradycardia    Exercise stress test normal 2008  . Diverticulitis 2012  . Emphysema    Last PFTs 1980s, not on current inhaler thearpy  . 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 Graves disease 1980s   RAI in 2008, not on any thyroid replacement hormone  . Osteoarthritis of shoulder    Bilateral arthritis/bursitis  . Rotator cuff tear arthropathy    R cuff  . Tubular adenoma of colon 01/2015    Past Surgical History:  Procedure Laterality Date  . ABDOMINAL HYSTERECTOMY  1989  . BREAST BIOPSY  12/22/2011   Procedure: BREAST BIOPSY;  Surgeon: Ralene Ok, MD;  Location: [facility];  Service: General;  Laterality: Right;  Excision right breast mass  . CHOLECYSTECTOMY  1989  . ROTATOR CUFF REPAIR     R shoulder x2 Dr. Kayleen Memos [facility]  . SHOULDER ARTHROSCOPY W/ SUPERIOR LABRAL ANTERIOR POSTERIOR LESION REPAIR  right    Social History   Tobacco Use  . Smoking status: Former Smoker    Packs/day: 1.00    Years: 20.00    Pack years: 20.00    Start date: 11/08/2011  . Smokeless tobacco: Never Used  Substance Use Topics  . Alcohol use: No  Alcohol/week: 0.0 oz    Family History  Problem Relation Age of Onset  . Pancreatic cancer Mother 1       pancreatic  . Hearing loss Father   . Stroke Father 75       ischemic stroke  . Colon cancer Maternal Grandfather 79  . Gastric cancer Maternal Aunt        gastric    Review of Systems  Constitutional: Negative for chills, fever, malaise/fatigue and weight loss.  HENT: Negative for congestion, ear pain and sore throat.   Eyes: Negative for blurred vision and double vision.  Respiratory: Negative for cough and shortness of breath.   Cardiovascular: Negative  for chest pain, palpitations and leg swelling.  Gastrointestinal: Negative for abdominal pain, blood in stool, constipation, diarrhea, melena, nausea and vomiting.  Genitourinary: Negative for dysuria and hematuria.       Neg breast lumps or nipple discharge Neg vaginal discharge, pelvic pain  Musculoskeletal: Negative for falls, joint pain and myalgias.  Neurological: Negative for dizziness, tingling, focal weakness and headaches.  Endo/Heme/Allergies: Negative for polydipsia.  Psychiatric/Behavioral: Negative for depression. The patient is not nervous/anxious.      OBJECTIVE:  Blood pressure 132/60, pulse 68, temperature 98.3 F (36.8 C), temperature source Oral, height 5\' 5"  (1.651 m), weight 121 lb (54.9 kg), SpO2 97 %.  Physical Exam  Constitutional: She is oriented to person, place, and time and well-developed, well-nourished, and in no distress.  HENT:  Head: Normocephalic and atraumatic.  Right Ear: Hearing, tympanic membrane, external ear and ear canal normal.  Left Ear: Hearing, tympanic membrane, external ear and ear canal normal.  Mouth/Throat: Oropharynx is clear and moist.  Eyes: EOM are normal. Pupils are equal, round, and reactive to light.  Neck: Neck supple. No thyromegaly present.  Cardiovascular: Normal rate, regular rhythm, normal heart sounds and intact distal pulses. Exam reveals no gallop and no friction rub.  No murmur heard. Pulmonary/Chest: Effort normal and breath sounds normal. She has no wheezes. She has no rales. Right breast exhibits no inverted nipple, no mass, no nipple discharge and no skin change. Left breast exhibits no inverted nipple, no mass, no nipple discharge and no skin change. Breasts are symmetrical.  Abdominal: Soft. Bowel sounds are normal. She exhibits no distension and no mass. There is no tenderness.  Musculoskeletal: Normal range of motion. She exhibits no edema.  Lymphadenopathy:    She has no cervical adenopathy.    She has no  axillary adenopathy.       Right: No supraclavicular adenopathy present.       Left: No supraclavicular adenopathy present.  Neurological: She is alert and oriented to person, place, and time. She has normal reflexes. Gait normal.  Skin: Skin is warm and dry.  Psychiatric: Mood and affect normal.  Nursing note and vitals reviewed.   ASSESSMENT and PLAN  1. Annual physical exam No concerns per history or exam. Routine HCM labs ordered. HCM reviewed/discussed. Anticipatory guidance regarding healthy weight, lifestyle and choices given.   - CBC - Comprehensive metabolic panel - Lipid panel  2. Visit for screening mammogram - MM DIGITAL SCREENING BILATERAL; Future  3. Tubular adenoma of colon - Ambulatory referral to Gastroenterology  4. Osteopenia, unspecified location - VITAMIN D 25 Hydroxy (Vit-D Deficiency, Fractures) - DG Bone Density; Future  5. History of thyroid disorder - TSH  Return in about 1 year (around 04/30/2018).    Rutherford Guys, MD [facility] [place]  [place], [place] [address] Ph.  [phone] Fax [phone]

## 2017-05-01 LAB — LIPID PANEL
Chol/HDL Ratio: 2.6 ratio (ref 0.0–4.4)
Cholesterol, Total: 154 mg/dL (ref 100–199)
HDL: 60 mg/dL (ref >39)
LDL Calculated: 79 mg/dL (ref 0–99)
Triglycerides: 76 mg/dL (ref 0–149)
VLDL Cholesterol Cal: 15 mg/dL (ref 5–40)

## 2017-05-01 LAB — CBC
Hematocrit: 38.6 % (ref 34.0–46.6)
Hemoglobin: 13.2 g/dL (ref 11.1–15.9)
MCH: 30.1 pg (ref 26.6–33.0)
MCHC: 34.2 g/dL (ref 31.5–35.7)
MCV: 88 fL (ref 79–97)
Platelets: 317 10*3/uL (ref 150–379)
RBC: 4.39 x10E6/uL (ref 3.77–5.28)
RDW: 13.8 % (ref 12.3–15.4)
WBC: 4.2 10*3/uL (ref 3.4–10.8)

## 2017-05-01 LAB — COMPREHENSIVE METABOLIC PANEL
ALT: 8 IU/L (ref 0–32)
AST: 12 IU/L (ref 0–40)
Albumin/Globulin Ratio: 1.7 (ref 1.2–2.2)
Albumin: 4.3 g/dL (ref 3.6–4.8)
Alkaline Phosphatase: 90 IU/L (ref 39–117)
BUN/Creatinine Ratio: 8 — ABNORMAL LOW (ref 12–28)
BUN: 5 mg/dL — ABNORMAL LOW (ref 8–27)
Bilirubin Total: 0.3 mg/dL (ref 0.0–1.2)
CO2: 23 mmol/L (ref 20–29)
Calcium: 9.4 mg/dL (ref 8.7–10.3)
Chloride: 110 mmol/L — ABNORMAL HIGH (ref 96–106)
Creatinine, Ser: 0.65 mg/dL (ref 0.57–1.00)
GFR calc Af Amer: 112 mL/min/{1.73_m2} (ref >59)
GFR calc non Af Amer: 97 mL/min/{1.73_m2} (ref >59)
Globulin, Total: 2.5 g/dL (ref 1.5–4.5)
Glucose: 87 mg/dL (ref 65–99)
Potassium: 4.1 mmol/L (ref 3.5–5.2)
Sodium: 147 mmol/L — ABNORMAL HIGH (ref 134–144)
Total Protein: 6.8 g/dL (ref 6.0–8.5)

## 2017-05-01 LAB — TSH: TSH: 2.68 u[IU]/mL (ref 0.450–4.500)

## 2017-05-01 LAB — VITAMIN D 25 HYDROXY (VIT D DEFICIENCY, FRACTURES): Vit D, 25-Hydroxy: 10.1 ng/mL — ABNORMAL LOW (ref 30.0–100.0)

## 2017-05-09 ENCOUNTER — Other Ambulatory Visit: Payer: Self-pay | Admitting: Family Medicine

## 2017-05-09 DIAGNOSIS — E559 Vitamin D deficiency, unspecified: Secondary | ICD-10-CM

## 2017-05-09 DIAGNOSIS — E870 Hyperosmolality and hypernatremia: Secondary | ICD-10-CM

## 2017-05-09 MED ORDER — VITAMIN D (ERGOCALCIFEROL) 1.25 MG (50000 UNIT) PO CAPS: 50000.0000 [IU] | ORAL_CAPSULE | ORAL | 1 refills | Status: DC

## 2017-05-13 ENCOUNTER — Telehealth: Payer: Self-pay

## 2017-05-13 NOTE — Telephone Encounter (Signed)
Copied from [place] [id number]. Topic: General - Other >> May 11, 2017  3:32 PM Yvette Rack wrote: Reason for CRM: patient calling stating that she want to know if the provider meant to write for her Vitamin D 2 because she has always been on  Vitamin D 3 because the Vit D 3 is more efficient for her it works better please call pt to explain

## 2017-05-13 NOTE — Telephone Encounter (Signed)
Please advise 

## 2017-05-14 NOTE — Telephone Encounter (Signed)
Yes, high dose vitamin D only comes in that formulation. Please take as prescribed. Thank you.

## 2017-05-15 NOTE — Telephone Encounter (Signed)
Phone call to patient. Left detailed message per signed release to take the Vitamin D high dose as prescribed. Please call back if any questions.

## 2017-05-21 ENCOUNTER — Encounter: Payer: 59 | Admitting: Family Medicine

## 2017-05-26 ENCOUNTER — Ambulatory Visit
Admission: RE | Admit: 2017-05-26 | Discharge: 2017-05-26 | Disposition: A | Discharge status: Home / Self Care | Payer: 59 | Source: Ambulatory Visit | Attending: Family Medicine | Admitting: Family Medicine

## 2017-05-26 DIAGNOSIS — M8580 Other specified disorders of bone density and structure, unspecified site: Secondary | ICD-10-CM

## 2017-05-26 DIAGNOSIS — Z1231 Encounter for screening mammogram for malignant neoplasm of breast: Secondary | ICD-10-CM

## 2017-05-27 DIAGNOSIS — M810 Age-related osteoporosis without current pathological fracture: Secondary | ICD-10-CM | POA: Insufficient documentation

## 2017-05-31 ENCOUNTER — Ambulatory Visit: Payer: 59

## 2017-05-31 DIAGNOSIS — E870 Hyperosmolality and hypernatremia: Secondary | ICD-10-CM

## 2017-05-31 DIAGNOSIS — E559 Vitamin D deficiency, unspecified: Secondary | ICD-10-CM

## 2017-06-01 LAB — BASIC METABOLIC PANEL
BUN/Creatinine Ratio: 11 — ABNORMAL LOW (ref 12–28)
BUN: 8 mg/dL (ref 8–27)
CO2: 23 mmol/L (ref 20–29)
Calcium: 9.7 mg/dL (ref 8.7–10.3)
Chloride: 102 mmol/L (ref 96–106)
Creatinine, Ser: 0.75 mg/dL (ref 0.57–1.00)
GFR calc Af Amer: 100 mL/min/{1.73_m2} (ref >59)
GFR calc non Af Amer: 87 mL/min/{1.73_m2} (ref >59)
Glucose: 96 mg/dL (ref 65–99)
Potassium: 4.5 mmol/L (ref 3.5–5.2)
Sodium: 142 mmol/L (ref 134–144)

## 2017-06-01 LAB — VITAMIN D 25 HYDROXY (VIT D DEFICIENCY, FRACTURES): Vit D, 25-Hydroxy: 33.4 ng/mL (ref 30.0–100.0)

## 2017-06-07 ENCOUNTER — Telehealth: Payer: Self-pay | Admitting: Family Medicine

## 2017-06-07 NOTE — Telephone Encounter (Signed)
Copied from [place] [id number]. Topic: Quick Communication - Lab Results >> Jun 07, 2017 12:39 PM Robina Ade, Helene Kelp D wrote: Patient called and would like her lab results given to her, please call patient back, thanks.

## 2017-06-07 NOTE — Telephone Encounter (Signed)
Please Review

## 2017-06-09 ENCOUNTER — Telehealth: Payer: Self-pay | Admitting: *Deleted

## 2017-06-09 NOTE — Telephone Encounter (Signed)
The fosomax wasn't called in. Patient will schedule an appointment in 3 weeks she is going out of town.

## 2017-06-10 ENCOUNTER — Other Ambulatory Visit: Payer: Self-pay | Admitting: Family Medicine

## 2017-06-10 MED ORDER — ALENDRONATE SODIUM 70 MG PO TABS: 70.0000 mg | ORAL_TABLET | ORAL | 11 refills | Status: DC

## 2017-06-10 NOTE — Telephone Encounter (Signed)
Fosomax not listed as medication. Request sent to [place] on 4/3. This is duplicate.

## 2017-06-10 NOTE — Telephone Encounter (Signed)
Pt. Is needing this done today as she is going out of town tomorrow. She has been trying to get this prescription for several days.   Please call in Today  [place], [place] - [address]  [address] [place] [address]  Phone: [phone] Fax: [phone]

## 2017-06-10 NOTE — Telephone Encounter (Signed)
Starting alendronate based on dexa. Had discussed with patient

## 2017-06-16 NOTE — Telephone Encounter (Signed)
Duplicate request, result note in place.

## 2017-06-22 ENCOUNTER — Ambulatory Visit (INDEPENDENT_AMBULATORY_CARE_PROVIDER_SITE_OTHER): Payer: 59 | Admitting: Family Medicine

## 2017-06-22 ENCOUNTER — Encounter: Payer: Self-pay | Admitting: Family Medicine

## 2017-06-22 ENCOUNTER — Other Ambulatory Visit: Payer: Self-pay

## 2017-06-22 VITALS — BP 100/60 | HR 63 | Temp 98.8°F | Ht 65.35 in | Wt 125.8 lb

## 2017-06-22 DIAGNOSIS — M810 Age-related osteoporosis without current pathological fracture: Secondary | ICD-10-CM | POA: Diagnosis not present

## 2017-06-22 DIAGNOSIS — E559 Vitamin D deficiency, unspecified: Secondary | ICD-10-CM

## 2017-06-22 NOTE — Patient Instructions (Addendum)
1. Once you complete your treatment with high dose vitamin D, start over the counter vitamin D3 2000 units once a day, continue with calcium 1200mg  a day and start alendronate once a week.  2. Lets recheck vitamin d in about 6 weeks.     IF you received an x-ray today, you will receive an invoice from [facility]. Please contact [facility] at [phone] with questions or concerns regarding your invoice.   IF you received labwork today, you will receive an invoice from [place]. Please contact [facility] at [phone] with questions or concerns regarding your invoice.   Our billing staff will not be able to assist you with questions regarding bills from these companies.  You will be contacted with the lab results as soon as they are available. The fastest way to get your results is to activate your My Chart account. Instructions are located on the last page of this paperwork. If you have not heard from [place] regarding the results in 2 weeks, please contact this office.

## 2017-06-22 NOTE — Progress Notes (Signed)
4/16/201910:20 AM  Brandi Stevens September 10, 1955, 62 y.o. female [id number]  Chief Complaint  Patient presents with  . Follow-up    follow up appt from annual physical. Says that the medication Vit D is causing constipation.     HPI:   Patient is a 62 y.o. female with past medical history significant for osteoporosis who presents today for followup  dexa 05/2017 osteoporosis of Left femur.  Vitamin d deficiency, 10.1, feb 2019  Having issues with constipation and high dose vit d. But managing with otc miralax, senna, increase fiber Has a month left, ok with completing treatment Has decreased intake of milk over the years due to GI intolerance but does eat daily dark greens Has no other other concerns today  Depression screen [facility] 2/9 06/22/2017 04/30/2017 03/19/2017  Decreased Interest 0 0 0  Down, Depressed, Hopeless 0 0 0  PHQ - 2 Score 0 0 0    Allergies  Allergen Reactions  . Latex Anaphylaxis    Latex gloves/ skin itch   . Sulfa Antibiotics Shortness Of Breath and Itching    Also, wheezing    Prior to Admission medications   Medication Sig Start Date End Date Taking? Authorizing Provider  acetaminophen (TYLENOL) 650 MG CR tablet Take 650 mg by mouth every 8 (eight) hours as needed for pain.   Yes [provider]  alendronate (FOSAMAX) 70 MG tablet Take 1 tablet (70 mg total) by mouth every 7 (seven) days. Take with a full glass of water on an empty stomach. 06/10/17  Yes Rutherford Guys, MD  Calcium Citrate (CALCITRATE PO) Take 1 tablet by mouth daily.   Yes [provider]  cetirizine (ZYRTEC) 10 MG chewable tablet Chew 10 mg by mouth daily.   Yes [provider]  cholecalciferol (VITAMIN D) 1000 UNITS tablet Take 1,000 Units by mouth daily.   Yes [provider]  diclofenac sodium (VOLTAREN) 1 % GEL Apply 2 g topically daily as needed. For shoulder pain per patient 09/26/16  Yes Posey Boyer, MD  fluticasone [facility]) 50 MCG/ACT  nasal spray Place into both nostrils daily.   Yes [provider]  Multiple Vitamins-Minerals (CENTRUM SILVER PO) Take 1 tablet by mouth daily.   Yes [provider]  olopatadine (PATANOL) 0.1 % ophthalmic solution 1 drop 2 (two) times daily.   Yes [provider]  Vitamin D, Ergocalciferol, (DRISDOL) 50000 units CAPS capsule Take 1 capsule (50,000 Units total) by mouth every 7 (seven) days. 05/09/17  Yes Rutherford Guys, MD    Past Medical History:  Diagnosis Date  . Arthritis    Bilateral shoulders.  . Bradycardia    Exercise stress test normal 2008  . Diverticulitis 2012  . Emphysema    Last PFTs 1980s, not on current inhaler thearpy  . 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 Graves disease 1980s   RAI in 2008, not on any thyroid replacement hormone  . Osteoarthritis of shoulder    Bilateral arthritis/bursitis  . Rotator cuff tear arthropathy    R cuff  . Tubular adenoma of colon 01/2015    Past Surgical History:  Procedure Laterality Date  . ABDOMINAL HYSTERECTOMY  1989  . BREAST BIOPSY  12/22/2011   Procedure: BREAST BIOPSY;  Surgeon: Ralene Ok, MD;  Location: [place];  Service: General;  Laterality: Right;  Excision right breast mass  . BREAST EXCISIONAL BIOPSY    . CHOLECYSTECTOMY  1989  . ROTATOR CUFF REPAIR  R shoulder x2 Dr. Kayleen Memos [facility]  . SHOULDER ARTHROSCOPY W/ SUPERIOR LABRAL ANTERIOR POSTERIOR LESION REPAIR  right    Social History   Tobacco Use  . Smoking status: Former Smoker    Packs/day: 1.00    Years: 20.00    Pack years: 20.00    Start date: 11/08/2011  . Smokeless tobacco: Never Used  Substance Use Topics  . Alcohol use: No    Alcohol/week: 0.0 oz    Family History  Problem Relation Age of Onset  . Pancreatic cancer Mother 23       pancreatic  . Hearing loss Father   . Stroke Father 57       ischemic stroke  . Colon cancer Maternal Grandfather 22  . Gastric cancer Maternal Aunt         gastric    ROS Per hpi  OBJECTIVE:  Blood pressure 100/60, pulse 63, temperature 98.8 F (37.1 C), temperature source Oral, height 5' 5.35" (1.66 m), weight 125 lb 12.8 oz (57.1 kg), SpO2 99 %.  Physical Exam  Constitutional: She is oriented to person, place, and time.  HENT:  Head: Normocephalic and atraumatic.  Mouth/Throat: Mucous membranes are normal.  Eyes: Pupils are equal, round, and reactive to light. EOM are normal. No scleral icterus.  Neck: Neck supple.  Pulmonary/Chest: Effort normal.  Neurological: She is alert and oriented to person, place, and time.  Skin: Skin is warm and dry.  Nursing note and vitals reviewed.    ASSESSMENT and PLAN  1. Age-related osteoporosis without current pathological fracture 2. Vitamin D deficiency Provided patient educational handouts for foods rich in calcium and vit d. Discussed transitioning to OTC vitamin d 2000 units a day, once high dose treatment is completed. Discussed starting alendronate at that time.  - Vitamin D, 25-hydroxy; Future  Return in about 6 months (around 12/22/2017).    Rutherford Guys, MD Primary Care at [place], [place] [address] Ph.  [phone] Fax [phone]

## 2017-07-12 ENCOUNTER — Encounter: Payer: Self-pay | Admitting: Physician Assistant

## 2017-07-12 ENCOUNTER — Ambulatory Visit (INDEPENDENT_AMBULATORY_CARE_PROVIDER_SITE_OTHER): Payer: 59 | Admitting: Physician Assistant

## 2017-07-12 ENCOUNTER — Other Ambulatory Visit: Payer: Self-pay

## 2017-07-12 VITALS — BP 112/62 | HR 70 | Temp 100.3°F | Resp 18 | Ht 64.57 in | Wt 126.4 lb

## 2017-07-12 DIAGNOSIS — E559 Vitamin D deficiency, unspecified: Secondary | ICD-10-CM | POA: Diagnosis not present

## 2017-07-12 DIAGNOSIS — J0100 Acute maxillary sinusitis, unspecified: Secondary | ICD-10-CM

## 2017-07-12 DIAGNOSIS — J029 Acute pharyngitis, unspecified: Secondary | ICD-10-CM

## 2017-07-12 DIAGNOSIS — R6883 Chills (without fever): Secondary | ICD-10-CM

## 2017-07-12 LAB — POCT CBC
Granulocyte percent: 69.5 %G (ref 37–80)
HCT, POC: 42.8 % (ref 37.7–47.9)
HEMOGLOBIN: 13.8 g/dL (ref 12.2–16.2)
Lymph, poc: 1.4 (ref 0.6–3.4)
MCH, POC: 29.2 pg (ref 27–31.2)
MCHC: 32.2 g/dL (ref 31.8–35.4)
MCV: 90.7 fL (ref 80–97)
MID (CBC): 0.1 (ref 0–0.9)
MPV: 7.9 fL (ref 0–99.8)
PLATELET COUNT, POC: 283 10*3/uL (ref 142–424)
POC Granulocyte: 3.5 (ref 2–6.9)
POC LYMPH PERCENT: 27.7 %L (ref 10–50)
POC MID %: 2.8 %M (ref 0–12)
RBC: 4.7 M/uL (ref 4.04–5.48)
RDW, POC: 13.4 %
WBC: 5 10*3/uL (ref 4.6–10.2)

## 2017-07-12 LAB — POCT INFLUENZA A/B
INFLUENZA A, POC: NEGATIVE
Influenza B, POC: NEGATIVE

## 2017-07-12 LAB — POCT RAPID STREP A (OFFICE): RAPID STREP A SCREEN: NEGATIVE

## 2017-07-12 MED ORDER — BENZONATATE 100 MG PO CAPS: 100.0000 mg | ORAL_CAPSULE | Freq: Three times a day (TID) | ORAL | 0 refills | Status: DC | PRN

## 2017-07-12 MED ORDER — AMOXICILLIN 875 MG PO TABS: 875.0000 mg | ORAL_TABLET | Freq: Two times a day (BID) | ORAL | 0 refills | Status: DC

## 2017-07-12 NOTE — Patient Instructions (Addendum)
I recommend continuing with oral antihistamine and nasal spray. You may also start using over the counter phenylephrine and nasal saline rinses.  - I recommend you rest, drink plenty of fluids, eat light meals including soups.  - You may use tessalon perles for cough.  - You may also use Tylenol or ibuprofen over-the-counter for your sore throat. Tea recipe for sore throat: boil water, add 2 inches shaved ginger root, steep 15 minutes, add juice from 2 full lemons, and 2 tbsp honey. - If no improvement in 3-5 days or symptoms worsen, I have sent in an antibiotic for you to take.     Sinusitis, Adult Sinusitis is soreness and inflammation of your sinuses. Sinuses are hollow spaces in the bones around your face. They are located:  Around your eyes.  In the middle of your forehead.  Behind your nose.  In your cheekbones.  Your sinuses and nasal passages are lined with a stringy fluid (mucus). Mucus normally drains out of your sinuses. When your nasal tissues get inflamed or swollen, the mucus can get trapped or blocked so air cannot flow through your sinuses. This lets bacteria, viruses, and funguses grow, and that leads to infection. Follow these instructions at home: Medicines  Take, use, or apply over-the-counter and prescription medicines only as told by your doctor. These may include nasal sprays.  If you were prescribed an antibiotic medicine, take it as told by your doctor. Do not stop taking the antibiotic even if you start to feel better. Hydrate and Humidify  Drink enough water to keep your pee (urine) clear or pale yellow.  Use a cool mist humidifier to keep the humidity level in your home above 50%.  Breathe in steam for 10-15 minutes, 3-4 times a day or as told by your doctor. You can do this in the bathroom while a hot shower is running.  Try not to spend time in cool or dry air. Rest  Rest as much as possible.  Sleep with your head raised (elevated).  Make sure to  get enough sleep each night. General instructions  Put a warm, moist washcloth on your face 3-4 times a day or as told by your doctor. This will help with discomfort.  Wash your hands often with soap and water. If there is no soap and water, use hand sanitizer.  Do not smoke. Avoid being around people who are smoking (secondhand smoke).  Keep all follow-up visits as told by your doctor. This is important. Contact a doctor if:  You have a fever.  Your symptoms get worse.  Your symptoms do not get better within 10 days. Get help right away if:  You have a very bad headache.  You cannot stop throwing up (vomiting).  You have pain or swelling around your face or eyes.  You have trouble seeing.  You feel confused.  Your neck is stiff.  You have trouble breathing. This information is not intended to replace advice given to you by your health care provider. Make sure you discuss any questions you have with your health care provider. Document Released: 08/12/2007 Document Revised: 10/20/2015 Document Reviewed: 12/19/2014 Elsevier Interactive Patient Education  2018 [employer].  IF you received an x-ray today, you will receive an invoice from [facility]. Please contact [facility] at [phone] with questions or concerns regarding your invoice.   IF you received labwork today, you will receive an invoice from [facility]. Please contact [facility] at [phone] with questions or concerns regarding your  invoice.   Our billing staff will not be able to assist you with questions regarding bills from these companies.  You will be contacted with the lab results as soon as they are available. The fastest way to get your results is to activate your My Chart account. Instructions are located on the last page of this paperwork. If you have not heard from [place] regarding the results in 2 weeks, please contact this office.

## 2017-07-12 NOTE — Progress Notes (Signed)
MRN: [id number] DOB: December 09, 1955  Subjective:   Brandi Stevens is a 62 y.o. female presenting for chief complaint of Cough (X 3 days- with sore throat and ear pain) and Chills (X 1 day) .  Reports 3 day history of sore throat, ear fullness, sinus pressure/pain, sinus headache, nasal congestion, subjective fever, chills, and dry hacking cough. Has tried sinus mucinex and daily allergy medication with no relief. Has been more congested this allergy season than in the past despite using medication consistently. However, this past week has been unbearable. Denies  wheezing, shortness of breath, chest pain and myalgia, nausea, vomiting, abdominal pain and diarrhea. Has not had sick contact with . Has history of seasonal allergies, takes patanal, flonase, and zyrtec. No history of asthma or COPD. Patient has had flu shot this season. Former smoker, quit 3 years ago. 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 Denies any other aggravating or relieving factors, no other questions or concerns.  Review of Systems  Constitutional: Negative for diaphoresis.  Eyes: Negative for blurred vision and double vision.  Neurological: Negative for dizziness, sensory change, speech change and weakness.    Christiane has a current medication list which includes the following prescription(s): acetaminophen, alendronate, calcium citrate, cetirizine, cholecalciferol, diclofenac sodium, fluticasone, multiple vitamins-minerals, olopatadine, and vitamin d (ergocalciferol). Also is allergic to latex and sulfa antibiotics.  Nathaniel  has a past medical history of Arthritis, Bradycardia, Diverticulitis (2012), Emphysema, Fibroadenoma of breast (01/2012), Graves disease (1980s), Osteoarthritis of shoulder, Rotator cuff tear arthropathy, and Tubular adenoma of colon (01/2015). Also  has a past surgical history that includes Shoulder arthroscopy w/ superior labral anterior posterior lesion repair  (right); Abdominal hysterectomy (1989); Rotator cuff repair; Breast biopsy (12/22/2011); Cholecystectomy (1989); and Breast excisional biopsy.   Objective:   Vitals: BP 112/62 (BP Location: Left Arm, Patient Position: Sitting, Cuff Size: Normal)   Pulse 70   Temp 100.3 F (37.9 C) (Oral)   Resp 18   Ht 5' 4.57" (1.64 m)   Wt 126 lb 6.4 oz (57.3 kg)   SpO2 96%   BMI 21.32 kg/m   Physical Exam  Constitutional: She is oriented to person, place, and time. She appears well-developed and well-nourished. No distress.  HENT:  Head: Normocephalic and atraumatic.  Right Ear: Tympanic membrane, external ear and ear canal normal.  Left Ear: Tympanic membrane, external ear and ear canal normal.  Nose: Mucosal edema (moderate bilaterally) present. Right sinus exhibits maxillary sinus tenderness ( R>L). Right sinus exhibits no frontal sinus tenderness. Left sinus exhibits maxillary sinus tenderness. Left sinus exhibits no frontal sinus tenderness.  Mouth/Throat: Uvula is midline and mucous membranes are normal. Posterior oropharyngeal erythema ( cobblestoning noted) present. No posterior oropharyngeal edema or tonsillar abscesses. Tonsils are 1+ on the right. Tonsils are 1+ on the left. No tonsillar exudate.  Eyes: Conjunctivae are normal. Right eye exhibits discharge (watery). Left eye exhibits discharge (watery).  Neck: Normal range of motion.  Cardiovascular: Normal rate, regular rhythm, normal heart sounds and intact distal pulses.  Pulmonary/Chest: Effort normal and breath sounds normal. She has no decreased breath sounds. She has no wheezes. She has no rhonchi. She has no rales.  Lymphadenopathy:       Head (right side): No submental, no submandibular, no tonsillar, no preauricular, no posterior auricular and no occipital adenopathy present.       Head (left side): No submental, no submandibular, no tonsillar, no preauricular, no  posterior auricular and no occipital adenopathy present.    She  has no cervical adenopathy.       Right: No supraclavicular adenopathy present.       Left: No supraclavicular adenopathy present.  Neurological: She is alert and oriented to person, place, and time.  Skin: Skin is warm and dry.  Psychiatric: She has a normal mood and affect.  Vitals reviewed.   Results for orders placed or performed in visit on 07/12/17 (from the past 24 hour(s))  POCT Influenza A/B     Status: None   Collection Time: 07/12/17 12:19 PM  Result Value Ref Range   Influenza A, POC Negative Negative   Influenza B, POC Negative Negative  POCT rapid strep A     Status: None   Collection Time: 07/12/17 12:19 PM  Result Value Ref Range   Rapid Strep A Screen Negative Negative  POCT CBC     Status: Normal   Collection Time: 07/12/17 12:39 PM  Result Value Ref Range   WBC 5.0 4.6 - 10.2 K/uL   Lymph, poc 1.4 0.6 - 3.4   POC LYMPH PERCENT 27.7 10 - 50 %L   MID (cbc) 0.1 0 - 0.9   POC MID % 2.8 0 - 12 %M   POC Granulocyte 3.5 2 - 6.9   Granulocyte percent 69.5 37 - 80 %G   RBC 4.70 4.04 - 5.48 M/uL   Hemoglobin 13.8 12.2 - 16.2 g/dL   HCT, POC 42.8 37.7 - 47.9 %   MCV 90.7 80 - 97 fL   MCH, POC 29.2 27 - 31.2 pg   MCHC 32.2 31.8 - 35.4 g/dL   RDW, POC 13.4 %   Platelet Count, POC 283 142 - 424 K/uL   MPV 7.9 0 - 99.8 fL    Assessment and Plan :  1. Acute non-recurrent maxillary sinusitis - Likely viral in etiology d/t reassuring physical exam findings and labs. She is febrile.  Pt does have extensive hx of seasonal allergies and takes medication daily.  - Recommended conitnuing supportive care, especially for the next 3-5 days. Encouraged short term use of phenylephrine. May also use nasal saline rinses. Given Rx for tessalon perles but educated pt these may not be helpful if cough is coming from PND.  Given Rx for antibiotic to use after 3-5 days if no full improvement in sx or sx worsen. Advised to return to clinic if sx worsen or do not improve with tx plan.  -  benzonatate (TESSALON) 100 MG capsule; Take 1-2 capsules (100-200 mg total) by mouth 3 (three) times daily as needed for cough.  Dispense: 40 capsule; Refill: 0 - amoxicillin (AMOXIL) 875 MG tablet; Take 1 tablet (875 mg total) by mouth 2 (two) times daily.  Dispense: 14 tablet; Refill: 0  2. Chills - POCT Influenza A/B  3. Sore throat - POCT rapid strep A - POCT CBC  4. Vitamin D deficiency - Vitamin D, 25-hydroxy   Tenna Delaine, PA-C  Primary Care at [place] 07/12/2017 12:57 PM

## 2017-07-13 ENCOUNTER — Encounter: Payer: Self-pay | Admitting: Physician Assistant

## 2017-07-13 LAB — VITAMIN D 25 HYDROXY (VIT D DEFICIENCY, FRACTURES): Vit D, 25-Hydroxy: 45.9 ng/mL (ref 30.0–100.0)

## 2017-10-19 ENCOUNTER — Telehealth: Payer: Self-pay | Admitting: Family Medicine

## 2017-10-19 NOTE — Telephone Encounter (Signed)
Copied from [place] [id number]. Topic: General - Other >> Oct 19, 2017  8:05 AM Brandi Stevens wrote: Reason for CRM: pt went to urgent care on 10-10-17  and was given rx for prednisone. Pt took her last dose of prednisone on 10-17-17. 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 Pt would like cream to put on her skin. Pt still has itching . [employer]. Pt is trying to kills the eggs . Pt will make an appt if necessary. Pt has been out of work until today

## 2017-10-20 ENCOUNTER — Ambulatory Visit (INDEPENDENT_AMBULATORY_CARE_PROVIDER_SITE_OTHER): Payer: 59 | Admitting: Physician Assistant

## 2017-10-20 ENCOUNTER — Other Ambulatory Visit: Payer: Self-pay

## 2017-10-20 ENCOUNTER — Encounter: Payer: Self-pay | Admitting: Physician Assistant

## 2017-10-20 VITALS — BP 120/73 | HR 63 | Temp 98.7°F | Resp 16 | Ht 64.5 in | Wt 128.4 lb

## 2017-10-20 DIAGNOSIS — R21 Rash and other nonspecific skin eruption: Secondary | ICD-10-CM | POA: Diagnosis not present

## 2017-10-20 DIAGNOSIS — L299 Pruritus, unspecified: Secondary | ICD-10-CM | POA: Diagnosis not present

## 2017-10-20 MED ORDER — HYDROXYZINE HCL 25 MG PO TABS: 25.0000 mg | ORAL_TABLET | Freq: Four times a day (QID) | ORAL | 0 refills | Status: DC | PRN

## 2017-10-20 MED ORDER — PREDNISONE 20 MG PO TABS: ORAL_TABLET | ORAL | 0 refills | Status: DC

## 2017-10-20 NOTE — Patient Instructions (Addendum)
Start taking Prednisone as directed for your rash Hydroxyzine is for itching. Take this as needed every 6-8 hours.  Come back if you are not experiencing improvement.    Hydroxyzine capsules or tablets What is this medicine? HYDROXYZINE (hye Marne i zeen) is an antihistamine. This medicine is used to treat allergy symptoms. It is also used to treat anxiety and tension. This medicine can be used with other medicines to induce sleep before surgery. This medicine may be used for other purposes; ask your health care provider or pharmacist if you have questions. COMMON BRAND NAME(S): ANX, Atarax, Rezine, Vistaril What should I tell my health care provider before I take this medicine? They need to know if you have any of these conditions: -any chronic illness -difficulty passing urine -glaucoma -heart disease -kidney disease -liver disease -lung disease -an unusual or allergic reaction to hydroxyzine, cetirizine, other medicines, foods, dyes, or preservatives -pregnant or trying to get pregnant -breast-feeding How should I use this medicine? Take this medicine by mouth with a full glass of water. Follow the directions on the prescription label. You may take this medicine with food or on an empty stomach. Take your medicine at regular intervals. Do not take your medicine more often than directed. Talk to your pediatrician regarding the use of this medicine in children. Special care may be needed. While this drug may be prescribed for children as young as 42 years of age for selected conditions, precautions do apply. Patients over 55 years old may have a stronger reaction and need a smaller dose. Overdosage: If you think you have taken too much of this medicine contact a poison control center or emergency room at once. NOTE: This medicine is only for you. Do not share this medicine with others. What if I miss a dose? If you miss a dose, take it as soon as you can. If it is almost time for your  next dose, take only that dose. Do not take double or extra doses. What may interact with this medicine? -alcohol -barbiturate medicines for sleep or seizures -medicines for colds, allergies -medicines for depression, anxiety, or emotional disturbances -medicines for pain -medicines for sleep -muscle relaxants This list may not describe all possible interactions. Give your health care provider a list of all the medicines, herbs, non-prescription drugs, or dietary supplements you use. Also tell them if you smoke, drink alcohol, or use illegal drugs. Some items may interact with your medicine. What should I watch for while using this medicine? Tell your doctor or health care professional if your symptoms do not improve. You may get drowsy or dizzy. Do not drive, use machinery, or do anything that needs mental alertness until you know how this medicine affects you. Do not stand or sit up quickly, especially if you are an older patient. This reduces the risk of dizzy or fainting spells. Alcohol may interfere with the effect of this medicine. Avoid alcoholic drinks. Your mouth may get dry. Chewing sugarless gum or sucking hard candy, and drinking plenty of water may help. Contact your doctor if the problem does not go away or is severe. This medicine may cause dry eyes and blurred vision. If you wear contact lenses you may feel some discomfort. Lubricating drops may help. See your eye doctor if the problem does not go away or is severe. If you are receiving skin tests for allergies, tell your doctor you are using this medicine. What side effects may I notice from receiving this medicine? Side effects  that you should report to your doctor or health care professional as soon as possible: -fast or irregular heartbeat -difficulty passing urine -seizures -slurred speech or confusion -tremor Side effects that usually do not require medical attention (report to your doctor or health care professional if  they continue or are bothersome): -constipation -drowsiness -fatigue -headache -stomach upset This list may not describe all possible side effects. Call your doctor for medical advice about side effects. You may report side effects to FDA at 1-800-FDA-1088. Where should I keep my medicine? Keep out of the reach of children. Store at room temperature between 15 and 30 degrees C (59 and 86 degrees F). Keep container tightly closed. Throw away any unused medicine after the expiration date. NOTE: This sheet is a summary. It may not cover all possible information. If you have questions about this medicine, talk to your doctor, pharmacist, or health care provider.  2018 Elsevier/Gold Standard (2007-07-08 14:50:59)  IF you received an x-ray today, you will receive an invoice from [facility]. Please contact [facility] at [phone] with questions or concerns regarding your invoice.   IF you received labwork today, you will receive an invoice from [facility] [facility]. Please contact [facility] at [phone] with questions or concerns regarding your invoice.   Our billing staff will not be able to assist you with questions regarding bills from these companies.  You will be contacted with the lab results as soon as they are available. The fastest way to get your results is to activate your My Chart account. Instructions are located on the last page of this paperwork. If you have not heard from [place] regarding the results in 2 weeks, please contact this office.

## 2017-10-20 NOTE — Telephone Encounter (Signed)
Patient was transferred up front to make an appointment.

## 2017-10-20 NOTE — Progress Notes (Signed)
Brandi Stevens  MRN: [id number] DOB: 18-Aug-1955  PCP: Rutherford Guys, MD  Subjective:  Pt is a 62 year old female who presents to clinic for rash.  She was treated with prednisone taper of 6 days.  Last dose was 3 days ago. This helped however rash is not completely gone.  Endorses itching all over her body. Rash originally started on her face and then involved back both legs both arms and chest. Unknown what caused the rash.  Review of Systems  Constitutional: Negative for chills, fatigue and fever.  Cardiovascular: Negative for chest pain and palpitations.  Gastrointestinal: Negative for abdominal pain, diarrhea, nausea and vomiting.  Skin: Positive for rash.    Patient Active Problem List   Diagnosis Date Noted  . Osteoporosis 05/27/2017  . Vitamin D deficiency 05/09/2017  . Submandibular lymphadenopathy 10/12/2013  . Routine health maintenance 04/12/2013  . Sinus bradycardia 05/27/2012  . Graves disease 05/27/2012  . Allergic conjunctivitis and rhinitis 05/27/2012  . Rotator cuff arthropathy 05/27/2012  . 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 Other emphysema ([place]) 05/27/2012  . Special screening for malignant neoplasms, colon 05/27/2012    Current Outpatient Medications on File Prior to Visit  Medication Sig Dispense Refill  . acetaminophen (TYLENOL) 650 MG CR tablet Take 650 mg by mouth every 8 (eight) hours as needed for pain.    Marland Kitchen alendronate (FOSAMAX) 70 MG tablet Take 1 tablet (70 mg total) by mouth every 7 (seven) days. Take with a full glass of water on an empty stomach. 4 tablet 11  . Calcium Citrate (CALCITRATE PO) Take 1 tablet by mouth daily.    . cetirizine (ZYRTEC) 10 MG chewable tablet Chew 10 mg by mouth daily.    . diclofenac sodium (VOLTAREN) 1 % GEL Apply 2 g topically daily as needed. For shoulder pain per patient 6 Tube 1  . fluticasone (FLONASE) 50 MCG/ACT nasal spray Place into both nostrils daily.    . Multiple Vitamins-Minerals (CENTRUM  SILVER PO) Take 1 tablet by mouth daily.    Marland Kitchen olopatadine (PATANOL) 0.1 % ophthalmic solution 1 drop 2 (two) times daily.    . Vitamin D, Ergocalciferol, (DRISDOL) 50000 units CAPS capsule Take 1 capsule (50,000 Units total) by mouth every 7 (seven) days. 12 capsule 1  . cholecalciferol (VITAMIN D) 1000 UNITS tablet Take 1,000 Units by mouth daily.     No current facility-administered medications on file prior to visit.     Allergies  Allergen Reactions  . Latex Anaphylaxis    Latex gloves/ skin itch   . Sulfa Antibiotics Shortness Of Breath and Itching    Also, wheezing     Objective:  BP 120/73 (BP Location: Right Arm, Patient Position: Sitting, Cuff Size: Normal)   Pulse 63   Temp 98.7 F (37.1 C) (Oral)   Resp 16   Ht 5' 4.5" (1.638 m)   Wt 128 lb 6.4 oz (58.2 kg)   SpO2 100%   BMI 21.70 kg/m   Physical Exam  Constitutional: She is oriented to person, place, and time. No distress.  Neurological: She is alert and oriented to person, place, and time.  Skin: Skin is warm and dry. Rash noted. Rash is maculopapular (Mild.  Diffusely of bilateral arms, legs, cheeks and neck.).  Psychiatric: Judgment normal.  Vitals reviewed.   Assessment and Plan :  1. Rash and nonspecific skin eruption -Patient presents complaining of a rash that was improved however not resolved with prednisonex 6 days.  Suspect longer prednisone taper needed.  We will try this and add hydroxyzine for itching as needed.  Return to clinic if no improvement or worsening symptoms. - predniSONE (DELTASONE) 20 MG tablet; Take 3 PO QAM x3days, 2 PO QAM x3days, 1 PO QAM x3days  Dispense: 18 tablet; Refill: 0  2. Itching - hydrOXYzine (ATARAX/VISTARIL) 25 MG tablet; Take 1 tablet (25 mg total) by mouth every 6 (six) hours as needed for itching.  Dispense: 30 tablet; Refill: 0   Mercer Pod, PA-C  [facility] 10/20/2017 5:51 PM  Please note: Portions of this report may  have been transcribed using dragon voice recognition software. Every effort was made to ensure accuracy; however, inadvertent computerized transcription errors may be present.

## 2017-10-20 NOTE — Telephone Encounter (Signed)
Pt is going out of town Friday morning and would like callback

## 2017-11-01 ENCOUNTER — Telehealth: Payer: Self-pay | Admitting: Family Medicine

## 2017-11-01 NOTE — Telephone Encounter (Signed)
Patient was transferred to book with [facility]

## 2017-11-01 NOTE — Telephone Encounter (Signed)
Copied from [place] [id number]. Topic: Quick Communication - See Telephone Encounter >> Nov 01, 2017  8:03 AM Gardiner Ramus wrote: CRM for notification. See Telephone encounter for: 11/01/17. Pt called and stated that she has been itching all over her body for over a month. She states that she would celiac blood test done. Please advise

## 2017-11-05 ENCOUNTER — Other Ambulatory Visit: Payer: Self-pay

## 2017-11-05 ENCOUNTER — Ambulatory Visit (INDEPENDENT_AMBULATORY_CARE_PROVIDER_SITE_OTHER): Payer: 59 | Admitting: Physician Assistant

## 2017-11-05 ENCOUNTER — Encounter: Payer: Self-pay | Admitting: Physician Assistant

## 2017-11-05 VITALS — BP 126/72 | HR 69 | Temp 98.1°F | Resp 16 | Ht 64.5 in | Wt 126.4 lb

## 2017-11-05 DIAGNOSIS — R140 Abdominal distension (gaseous): Secondary | ICD-10-CM | POA: Diagnosis not present

## 2017-11-05 NOTE — Progress Notes (Signed)
Brandi Stevens  MRN: [id number] DOB: [birth year]  PCP: Rutherford Guys, MD  Subjective:  Pt is a pleasant 62 year old female who presents to clinic for multiple complaints.   Rash on her back, b/l arms and legs. This is not a new problem.  She was here for this problem 8/14 and had prednisone taper prior to that. Symptoms improved with prednisone but con't to return.  Rash originally started on her face and then involved back both legs both arms and chest. Endorses itching.   Abdominal pain and bloating x 6 months. Mostly after bread and pasta "I just swell. It feels like my intestines are stretching".   Endorses diarrhea to constipation. "glue-looking constipation poop stuff". She would like celiac testing. She has not tried elimination diet bc she wants accurate lab results.  Denies n/v.  She has never been evaluated by allergy.   She has appt for colonoscopy in Nov.  Gall bladder removed in 1989.   Wt Readings from Last 3 Encounters:  11/05/17 126 lb 6.4 oz (57.3 kg)  10/20/17 128 lb 6.4 oz (58.2 kg)  07/12/17 126 lb 6.4 oz (57.3 kg)    Review of Systems  Constitutional: Negative for appetite change, chills, diaphoresis, fatigue, fever and unexpected weight change.  Gastrointestinal: Positive for abdominal pain, constipation and diarrhea. Negative for nausea and vomiting.  Skin: Positive for rash.   Patient Active Problem List   Diagnosis Date Noted  . Osteoporosis 05/27/2017  . Vitamin D deficiency 05/09/2017  . Submandibular lymphadenopathy 10/12/2013  . Routine health maintenance 04/12/2013  . Sinus bradycardia 05/27/2012  . Graves disease 05/27/2012  . Allergic conjunctivitis and rhinitis 05/27/2012  . Rotator cuff arthropathy 05/27/2012  . 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 Other emphysema ([place]) 05/27/2012  . Special screening for malignant neoplasms, colon 05/27/2012    Current Outpatient Medications on File Prior to Visit  Medication Sig Dispense  Refill  . acetaminophen (TYLENOL) 650 MG CR tablet Take 650 mg by mouth every 8 (eight) hours as needed for pain.    . Calcium Citrate (CALCITRATE PO) Take 1 tablet by mouth daily.    . cetirizine (ZYRTEC) 10 MG chewable tablet Chew 10 mg by mouth daily.    . cholecalciferol (VITAMIN D) 1000 UNITS tablet Take 1,000 Units by mouth daily.    . diclofenac sodium (VOLTAREN) 1 % GEL Apply 2 g topically daily as needed. For shoulder pain per patient 6 Tube 1  . fluticasone (FLONASE) 50 MCG/ACT nasal spray Place into both nostrils daily.    . hydrOXYzine (ATARAX/VISTARIL) 25 MG tablet Take 1 tablet (25 mg total) by mouth every 6 (six) hours as needed for itching. 30 tablet 0  . Multiple Vitamins-Minerals (CENTRUM SILVER PO) Take 1 tablet by mouth daily.    Marland Kitchen olopatadine (PATANOL) 0.1 % ophthalmic solution 1 drop 2 (two) times daily.     No current facility-administered medications on file prior to visit.     Allergies  Allergen Reactions  . Latex Anaphylaxis    Latex gloves/ skin itch   . Sulfa Antibiotics Shortness Of Breath and Itching    Also, wheezing     Objective:  BP 126/72 (BP Location: Left Arm, Patient Position: Sitting, Cuff Size: Normal)   Pulse 69   Temp 98.1 F (36.7 C) (Oral)   Resp 16   Ht 5' 4.5" (1.638 m)   Wt 126 lb 6.4 oz (57.3 kg)   SpO2 100%   BMI 21.36  kg/m   Physical Exam  Constitutional: She is oriented to person, place, and time. No distress.  Cardiovascular: Normal rate, regular rhythm and normal heart sounds.  Abdominal: Soft. Normal appearance and bowel sounds are normal. There is no tenderness.  Neurological: She is alert and oriented to person, place, and time.  Skin: Skin is warm and dry.  Psychiatric: Judgment normal.  Vitals reviewed.   Assessment and Plan :  1. Abdominal bloating - Pt presents for abdominal bloating and abnormal stools x 6 months. Also endorses itchy rash x several weeks improved but not resolved with prednisone. Will collect  labs and contact with results. She has colonoscopy in November - advised pt to speak with GI provider regarding possible biopsy.  - IgG Allergens(96) Foods - CMP14+EGFR - Amylase - Lipase - Calprotectin, Fecal - Celiac Ab tTG DGP TIgA - CBC with Differential/Platelet - C-reactive protein - Sedimentation Rate  Mercer Pod, PA-C  [facility] [place] 11/05/2017 9:58 AM  Please note: Portions of this report may have been transcribed using dragon voice recognition software. Every effort was made to ensure accuracy; however, inadvertent computerized transcription errors may be present.

## 2017-11-05 NOTE — Patient Instructions (Addendum)
   I will contact you with the results of your lab tests.  Keep a food diary to track your symptoms.   FOOD DIARIES-Food diaries are written records of everything that is ingested by a patient, including all foodstuffs, beverages, condiments, and candies. Although rarely diagnostic on their own, food diaries occasionally may be helpful in identifying a food that was overlooked by the patient, a food containing hidden ingredients, or patterns of reactions (eg, in association with exercise, alcohol, or anti-inflammatory medications).  Thank you for coming in today. I hope you feel we met your needs.  Feel free to call PCP if you have any questions or further requests.  Please consider signing up for MyChart if you do not already have it, as this is a great way to communicate with me.  Best,  Whitney McVey, PA-C  IF you received an x-ray today, you will receive an invoice from [facility]. Please contact [facility] at [phone] with questions or concerns regarding your invoice.   IF you received labwork today, you will receive an invoice from [facility]. Please contact [facility] at [phone] with questions or concerns regarding your invoice.   Our billing staff will not be able to assist you with questions regarding bills from these companies.  You will be contacted with the lab results as soon as they are available. The fastest way to get your results is to activate your My Chart account. Instructions are located on the last page of this paperwork. If you have not heard from [place] regarding the results in 2 weeks, please contact this office.

## 2017-11-08 LAB — IGG ALLERGENS(96) FOODS
C074-IgG Gelatin: <2 ug/mL (ref 0.0–1.9)
Casein IgG: <2 ug/mL (ref 0.0–1.9)
Chicken IgG: <2 ug/mL (ref 0.0–1.9)
Chili Pepper IgG: 3.9 ug/mL — ABNORMAL HIGH (ref 0.0–1.9)
Chocolate/Cacao IgG: 2.5 ug/mL — ABNORMAL HIGH (ref 0.0–1.9)
Coffee IgG: 3.7 ug/mL — ABNORMAL HIGH (ref 0.0–1.9)
Corn IgG: 8.6 ug/mL — ABNORMAL HIGH (ref 0.0–1.9)
F001-IgG Egg White: <2 ug/mL (ref 0.0–1.9)
F003-IgG Codfish: <2 ug/mL (ref 0.0–1.9)
F006-IgG Barley, Whole: 12.3 ug/mL — ABNORMAL HIGH (ref 0.0–1.9)
F009-IgG Rice: 5.5 ug/mL — ABNORMAL HIGH (ref 0.0–1.9)
F010-IgG Sesame Seed: 5.8 ug/mL — ABNORMAL HIGH (ref 0.0–1.9)
F012-IgG Green Pea: <2 ug/mL (ref 0.0–1.9)
F015-IgG White Bean: <2 ug/mL (ref 0.0–1.9)
F020-IgG Almond: <2 ug/mL (ref 0.0–1.9)
F023-IgG Crab: <2 ug/mL (ref 0.0–1.9)
F027-IgG Beef: 12 ug/mL — ABNORMAL HIGH (ref 0.0–1.9)
F031-IgG Carrot: 2.2 ug/mL — ABNORMAL HIGH (ref 0.0–1.9)
F033-IgG Orange: <2 ug/mL (ref 0.0–1.9)
F036-IgG Coconut: <2 ug/mL (ref 0.0–1.9)
F040-IgG Tuna: <2 ug/mL (ref 0.0–1.9)
F041-IgG Salmon: <2 ug/mL (ref 0.0–1.9)
F044-IgG Strawberry: 3.7 ug/mL — ABNORMAL HIGH (ref 0.0–1.9)
F047-IgG Garlic: 4.7 ug/mL — ABNORMAL HIGH (ref 0.0–1.9)
F049-IgG Apple: 2.6 ug/mL — ABNORMAL HIGH (ref 0.0–1.9)
F054-IgG Sweet Potato: <2 ug/mL (ref 0.0–1.9)
F075-IgG Egg (Yolk): <2 ug/mL (ref 0.0–1.9)
F076-IgG Alpha Lactalbumin: 2.4 ug/mL — ABNORMAL HIGH (ref 0.0–1.9)
F077-IgG Beta Lactoglobulin: <2 ug/mL (ref 0.0–1.9)
F079-IgG Gluten: 8.1 ug/mL — ABNORMAL HIGH (ref 0.0–1.9)
F080-IgG Lobster: <2 ug/mL (ref 0.0–1.9)
F081-IgG Cheese, Cheddar Type: <2 ug/mL (ref 0.0–1.9)
F085-IgG Celery: <2 ug/mL (ref 0.0–1.9)
F087-IgG Melon: 2.3 ug/mL — ABNORMAL HIGH (ref 0.0–1.9)
F089-IgG Mustard: <2 ug/mL (ref 0.0–1.9)
F090-IgG Malt: 11.5 ug/mL — ABNORMAL HIGH (ref 0.0–1.9)
F092-IgG Banana: 6.5 ug/mL — ABNORMAL HIGH (ref 0.0–1.9)
F094-IgG Pear: <2 ug/mL (ref 0.0–1.9)
F095-IgG Peach: <2 ug/mL (ref 0.0–1.9)
F096-IgG Avocado: <2 ug/mL (ref 0.0–1.9)
F182-IgG Lima Bean: <2 ug/mL (ref 0.0–1.9)
F202-IgG Cashew Nut: 3.2 ug/mL — ABNORMAL HIGH (ref 0.0–1.9)
F207-IgG Clam: <2 ug/mL (ref 0.0–1.9)
F208-IgG Lemon: 3 ug/mL — ABNORMAL HIGH (ref 0.0–1.9)
F209-IgG Grapefruit: <2 ug/mL (ref 0.0–1.9)
F210-IgG Pineapple: 2.7 ug/mL — ABNORMAL HIGH (ref 0.0–1.9)
F212-IgG Mushroom: 3.3 ug/mL — ABNORMAL HIGH (ref 0.0–1.9)
F214-IgG Spinach: 3.3 ug/mL — ABNORMAL HIGH (ref 0.0–1.9)
F215-IgG Lettuce: <2 ug/mL (ref 0.0–1.9)
F216-IgG Cabbage: <2 ug/mL (ref 0.0–1.9)
F220-IgG Cinnamon: 4.1 ug/mL — ABNORMAL HIGH (ref 0.0–1.9)
F222-IgG Tea: 2.4 ug/mL — ABNORMAL HIGH (ref 0.0–1.9)
F225-IgG Pumpkin: <2 ug/mL (ref 0.0–1.9)
F234-IgG Vanilla: <2 ug/mL (ref 0.0–1.9)
F236-IgG Whey: 6.7 ug/mL — ABNORMAL HIGH (ref 0.0–1.9)
F242-IgG Bing Cherry: 3.6 ug/mL — ABNORMAL HIGH (ref 0.0–1.9)
F244-IgG Cucumber: 4.7 ug/mL — ABNORMAL HIGH (ref 0.0–1.9)
F247-IgG Honey: <2 ug/mL (ref 0.0–1.9)
F256-IgG Walnut: <2 ug/mL (ref 0.0–1.9)
F259-IgG Grape: 2.2 ug/mL — ABNORMAL HIGH (ref 0.0–1.9)
F260-IgG Broccoli: <2 ug/mL (ref 0.0–1.9)
F261-IgG Asparagus: 3 ug/mL — ABNORMAL HIGH (ref 0.0–1.9)
F262-IgG Eggplant: <2 ug/mL (ref 0.0–1.9)
F263-IgG Green Bell Pepper: <2 ug/mL (ref 0.0–1.9)
F265-IgG Cumin: <2 ug/mL (ref 0.0–1.9)
F269-IgG Basil: <2 ug/mL (ref 0.0–1.9)
F270-IgG Ginger: <2 ug/mL (ref 0.0–1.9)
F278-IgG Bayleaf (Laurel): <2 ug/mL (ref 0.0–1.9)
F280-IgG Black Pepper: 11 ug/mL — ABNORMAL HIGH (ref 0.0–1.9)
F283-IgG Oregano: 2.8 ug/mL — ABNORMAL HIGH (ref 0.0–1.9)
F284-IgG Turkey: <2 ug/mL (ref 0.0–1.9)
F287-IgG Kidney Bean: <2 ug/mL (ref 0.0–1.9)
F288-IgG Blueberry: 4 ug/mL — ABNORMAL HIGH (ref 0.0–1.9)
F291-IgG Cauliflower: <2 ug/mL (ref 0.0–1.9)
F300-IgG Goat's Milk: 4 ug/mL — ABNORMAL HIGH (ref 0.0–1.9)
F306-IgG Lime: <2 ug/mL (ref 0.0–1.9)
F319-IgG Red Beet: 2.9 ug/mL — ABNORMAL HIGH (ref 0.0–1.9)
F324-IgG Hop (Food): 6.1 ug/mL — ABNORMAL HIGH (ref 0.0–1.9)
F329-IgG Watermelon: <2 ug/mL (ref 0.0–1.9)
F338-IgG Scallop: <2 ug/mL (ref 0.0–1.9)
F341-IgG Cranberry: <2 ug/mL (ref 0.0–1.9)
F342-IgG Olive, Black: <2 ug/mL (ref 0.0–1.9)
F343-IgG Raspberry: 2.9 ug/mL — ABNORMAL HIGH (ref 0.0–1.9)
Green Bean IgG: 2.4 ug/mL — ABNORMAL HIGH (ref 0.0–1.9)
Lamb IgG: 2 ug/mL — ABNORMAL HIGH (ref 0.0–1.9)
Oat IgG: 3.6 ug/mL — ABNORMAL HIGH (ref 0.0–1.9)
Onion IgG: 2.2 ug/mL — ABNORMAL HIGH (ref 0.0–1.9)
Peanut IgG: <2 ug/mL (ref 0.0–1.9)
Pork IgG: 2 ug/mL — ABNORMAL HIGH (ref 0.0–1.9)
Potato, White, IgG: <2 ug/mL (ref 0.0–1.9)
Rye IgG: 8.5 ug/mL — ABNORMAL HIGH (ref 0.0–1.9)
Shrimp IgG: <2 ug/mL (ref 0.0–1.9)
Soybean IgG: <2 ug/mL (ref 0.0–1.9)
Tomato IgG: 2.2 ug/mL — ABNORMAL HIGH (ref 0.0–1.9)
Wheat IgG: 9.7 ug/mL — ABNORMAL HIGH (ref 0.0–1.9)
Yeast IgG: 4.2 ug/mL — ABNORMAL HIGH (ref 0.0–1.9)

## 2017-11-08 LAB — CBC WITH DIFFERENTIAL/PLATELET
Basophils Absolute: 0 10*3/uL (ref 0.0–0.2)
Basos: 0 %
EOS (ABSOLUTE): 0.1 10*3/uL (ref 0.0–0.4)
Eos: 1 %
Hematocrit: 44.9 % (ref 34.0–46.6)
Hemoglobin: 14.7 g/dL (ref 11.1–15.9)
Immature Grans (Abs): 0 10*3/uL (ref 0.0–0.1)
Immature Granulocytes: 0 %
Lymphocytes Absolute: 1.8 10*3/uL (ref 0.7–3.1)
Lymphs: 33 %
MCH: 29.6 pg (ref 26.6–33.0)
MCHC: 32.7 g/dL (ref 31.5–35.7)
MCV: 90 fL (ref 79–97)
Monocytes Absolute: 0.5 10*3/uL (ref 0.1–0.9)
Monocytes: 8 %
Neutrophils Absolute: 3.2 10*3/uL (ref 1.4–7.0)
Neutrophils: 58 %
Platelets: 323 10*3/uL (ref 150–450)
RBC: 4.97 x10E6/uL (ref 3.77–5.28)
RDW: 13.4 % (ref 12.3–15.4)
WBC: 5.6 10*3/uL (ref 3.4–10.8)

## 2017-11-08 LAB — C-REACTIVE PROTEIN: CRP: 1 mg/L (ref 0–10)

## 2017-11-08 LAB — LIPASE: Lipase: 17 U/L (ref 14–72)

## 2017-11-08 LAB — CMP14+EGFR
ALT: 18 IU/L (ref 0–32)
AST: 16 IU/L (ref 0–40)
Albumin/Globulin Ratio: 2 (ref 1.2–2.2)
Albumin: 4.9 g/dL — ABNORMAL HIGH (ref 3.6–4.8)
Alkaline Phosphatase: 93 IU/L (ref 39–117)
BUN/Creatinine Ratio: 11 — ABNORMAL LOW (ref 12–28)
BUN: 8 mg/dL (ref 8–27)
Bilirubin Total: 0.3 mg/dL (ref 0.0–1.2)
CO2: 21 mmol/L (ref 20–29)
Calcium: 10.2 mg/dL (ref 8.7–10.3)
Chloride: 101 mmol/L (ref 96–106)
Creatinine, Ser: 0.7 mg/dL (ref 0.57–1.00)
GFR calc Af Amer: 108 mL/min/{1.73_m2} (ref >59)
GFR calc non Af Amer: 94 mL/min/{1.73_m2} (ref >59)
Globulin, Total: 2.4 g/dL (ref 1.5–4.5)
Glucose: 81 mg/dL (ref 65–99)
Potassium: 4.4 mmol/L (ref 3.5–5.2)
Sodium: 141 mmol/L (ref 134–144)
Total Protein: 7.3 g/dL (ref 6.0–8.5)

## 2017-11-08 LAB — SEDIMENTATION RATE: Sed Rate: 31 mm/h (ref 0–40)

## 2017-11-08 LAB — CELIAC AB TTG DGP TIGA
Antigliadin Abs, IgA: 1 U (ref 0–19)
Gliadin IgG: 3 U (ref 0–19)
IgA/Immunoglobulin A, Serum: 7 mg/dL — ABNORMAL LOW (ref 87–352)
Tissue Transglut Ab: <2 U/mL (ref 0–5)
Transglutaminase IgA: <2 U/mL (ref 0–3)

## 2017-11-08 LAB — AMYLASE: Amylase: 70 U/L (ref 31–124)

## 2017-11-10 LAB — CALPROTECTIN, FECAL: Calprotectin, Fecal: 105 ug/g (ref 0–120)

## 2017-11-15 ENCOUNTER — Telehealth: Payer: Self-pay | Admitting: Physician Assistant

## 2017-11-15 NOTE — Telephone Encounter (Signed)
Copied from [place] [phone]. Topic: Quick Communication - See Telephone Encounter >> Nov 15, 2017  2:29 PM Bea Graff, NT wrote: CRM for notification. See Telephone encounter for: 11/15/17. Pt requesting a call with her lab results.

## 2017-11-15 NOTE — Telephone Encounter (Signed)
Please advise 

## 2017-11-17 ENCOUNTER — Encounter: Payer: Self-pay | Admitting: Physician Assistant

## 2017-11-17 NOTE — Progress Notes (Signed)
Result letter sent in mail with instructions.  Does not look like celiac disease, inflammatory marker not elevated, pancrease wnl.  Suggest starting with food allergy results, eliminating highest food values and go from there.  Advised food journal.  RTC in 1-2 months if no improvement, consider referral.

## 2017-11-17 NOTE — Telephone Encounter (Signed)
Pt is calling again to check status of lab results.

## 2017-11-18 NOTE — Telephone Encounter (Signed)
Notes recorded by Dorise Hiss, PA-C on 11/17/2017 at 6:37 PM EDT Result letter sent in mail with instructions

## 2017-12-16 ENCOUNTER — Ambulatory Visit (INDEPENDENT_AMBULATORY_CARE_PROVIDER_SITE_OTHER): Payer: 59 | Admitting: Physician Assistant

## 2017-12-16 DIAGNOSIS — Z23 Encounter for immunization: Secondary | ICD-10-CM

## 2017-12-29 ENCOUNTER — Encounter: Payer: Self-pay | Admitting: Gastroenterology

## 2018-02-10 ENCOUNTER — Other Ambulatory Visit: Payer: Self-pay

## 2018-02-10 ENCOUNTER — Ambulatory Visit (AMBULATORY_SURGERY_CENTER): Payer: Self-pay | Admitting: *Deleted

## 2018-02-10 VITALS — Ht 64.0 in | Wt 128.6 lb

## 2018-02-10 DIAGNOSIS — Z86010 Personal history of colonic polyps: Secondary | ICD-10-CM

## 2018-02-10 MED ORDER — SUPREP BOWEL PREP KIT 17.5-3.13-1.6 GM/177ML PO SOLN: 1.0000 | Freq: Once | ORAL | 0 refills | Status: AC

## 2018-02-10 NOTE — Progress Notes (Signed)
Patient denies any allergies to egg or soy products. Patient has PONV with anesthesia/sedation.  Patient denies oxygen use at home and denies diet medications. Pamphlet given to patient on colonoscopy.

## 2018-02-24 ENCOUNTER — Encounter: Payer: Self-pay | Admitting: Gastroenterology

## 2018-02-24 ENCOUNTER — Ambulatory Visit (AMBULATORY_SURGERY_CENTER): Payer: 59 | Admitting: Gastroenterology

## 2018-02-24 VITALS — BP 107/53 | HR 52 | Temp 96.9°F | Resp 10 | Ht 64.0 in | Wt 128.0 lb

## 2018-02-24 DIAGNOSIS — Z86010 Personal history of colonic polyps: Secondary | ICD-10-CM | POA: Diagnosis present

## 2018-02-24 DIAGNOSIS — D125 Benign neoplasm of sigmoid colon: Secondary | ICD-10-CM | POA: Diagnosis not present

## 2018-02-24 DIAGNOSIS — D120 Benign neoplasm of cecum: Secondary | ICD-10-CM | POA: Diagnosis not present

## 2018-02-24 MED ORDER — SODIUM CHLORIDE 0.9 % IV SOLN: 500.0000 mL | Freq: Once | INTRAVENOUS | Status: DC

## 2018-02-24 NOTE — Patient Instructions (Signed)
Handouts given for polyps  YOU HAD AN ENDOSCOPIC PROCEDURE TODAY AT [facility]:   Refer to the procedure report that was given to you for any specific questions about what was found during the examination.  If the procedure report does not answer your questions, please call your gastroenterologist to clarify.  If you requested that your care partner not be given the details of your procedure findings, then the procedure report has been included in a sealed envelope for you to review at your convenience later.  YOU SHOULD EXPECT: Some feelings of bloating in the abdomen. Passage of more gas than usual.  Walking can help get rid of the air that was put into your GI tract during the procedure and reduce the bloating. If you had a lower endoscopy (such as a colonoscopy or flexible sigmoidoscopy) you may notice spotting of blood in your stool or on the toilet paper. If you underwent a bowel prep for your procedure, you may not have a normal bowel movement for a few days.  Please Note:  You might notice some irritation and congestion in your nose or some drainage.  This is from the oxygen used during your procedure.  There is no need for concern and it should clear up in a day or so.  SYMPTOMS TO REPORT IMMEDIATELY:   Following lower endoscopy (colonoscopy or flexible sigmoidoscopy):  Excessive amounts of blood in the stool  Significant tenderness or worsening of abdominal pains  Swelling of the abdomen that is new, acute  Fever of 100F or higher   For urgent or emergent issues, a gastroenterologist can be reached at any hour by calling [phone].   DIET:  We do recommend a small meal at first, but then you may proceed to your regular diet.  Drink plenty of fluids but you should avoid alcoholic beverages for 24 hours.  ACTIVITY:  You should plan to take it easy for the rest of today and you should NOT DRIVE or use heavy machinery until tomorrow (because of the sedation  medicines used during the test).    FOLLOW UP: Our staff will call the number listed on your records the next business day following your procedure to check on you and address any questions or concerns that you may have regarding the information given to you following your procedure. If we do not reach you, we will leave a message.  However, if you are feeling well and you are not experiencing any problems, there is no need to return our call.  We will assume that you have returned to your regular daily activities without incident.  If any biopsies were taken you will be contacted by phone or by letter within the next 1-3 weeks.  Please call us at [phone] if you have not heard about the biopsies in 3 weeks.    SIGNATURES/CONFIDENTIALITY: You and/or your care partner have signed paperwork which will be entered into your electronic medical record.  These signatures attest to the fact that that the information above on your After Visit Summary has been reviewed and is understood.  Full responsibility of the confidentiality of this discharge information lies with you and/or your care-partner.

## 2018-02-24 NOTE — Progress Notes (Signed)
Pt's states no medical or surgical changes since previsit or office visit. 

## 2018-02-24 NOTE — Op Note (Signed)
[facility] Patient Name: Brandi Stevens Procedure Date: 02/24/2018 9:28 AM MRN: [id number] Endoscopist: Ladene Artist , MD Age: 62 Referring MD:  Date of Birth: 1955-04-26 Gender: Female Account #: [id number] Procedure:                Colonoscopy Indications:              Surveillance: Personal history of adenomatous                            polyps on last colonoscopy 3 years ago Medicines:                Monitored Anesthesia Care Procedure:                Pre-Anesthesia Assessment:                           - Prior to the procedure, a History and Physical                            was performed, and patient medications and                            allergies were reviewed. The patient's tolerance of                            previous anesthesia was also reviewed. The risks                            and benefits of the procedure and the sedation                            options and risks were discussed with the patient.                            All questions were answered, and informed consent                            was obtained. Prior Anticoagulants: The patient has                            taken no previous anticoagulant or antiplatelet                            agents. ASA Grade Assessment: II - A patient with                            mild systemic disease. After reviewing the risks                            and benefits, the patient was deemed in                            satisfactory condition to undergo the procedure.  After obtaining informed consent, the colonoscope                            was passed under direct vision. Throughout the                            procedure, the patient's blood pressure, pulse, and                            oxygen saturations were monitored continuously. The                            Colonoscope was introduced through the anus and                            advanced to the the  cecum, identified by                            appendiceal orifice and ileocecal valve. The                            ileocecal valve, appendiceal orifice, and rectum                            were photographed. The quality of the bowel                            preparation was good. The colonoscopy was performed                            without difficulty. The patient tolerated the                            procedure well. Scope In: 9:35:07 AM Scope Out: 9:52:47 AM Scope Withdrawal Time: 0 hours 14 minutes 4 seconds  Total Procedure Duration: 0 hours 17 minutes 40 seconds  Findings:                 The perianal and digital rectal examinations were                            normal.                           Four sessile polyps were found in the sigmoid colon                            (1) and cecum (3). The polyps were 6 to 8 mm in                            size. These polyps were removed with a cold snare.                            Resection and retrieval were complete.  The exam was otherwise without abnormality on                            direct and retroflexion views. Complications:            No immediate complications. Estimated blood loss:                            None. Estimated Blood Loss:     Estimated blood loss: none. Impression:               - Four 6 to 8 mm polyps in the sigmoid colon and in                            the cecum, removed with a cold snare. Resected and                            retrieved.                           - The examination was otherwise normal on direct                            and retroflexion views. Recommendation:           - Repeat colonoscopy in 3 years for surveillance.                           - Patient has a contact number available for                            emergencies. The signs and symptoms of potential                            delayed complications were discussed with the                             patient. Return to normal activities tomorrow.                            Written discharge instructions were provided to the                            patient.                           - Resume previous diet.                           - Continue present medications.                           - Await pathology results. Ladene Artist, MD 02/24/2018 9:55:09 AM This report has been signed electronically.

## 2018-02-24 NOTE — Progress Notes (Signed)
Called to room to assist during endoscopic procedure.  Patient ID and intended procedure confirmed with present staff. Received instructions for my participation in the procedure from the performing physician.  

## 2018-02-24 NOTE — Progress Notes (Signed)
Report given to PACU, vss 

## 2018-02-25 ENCOUNTER — Telehealth: Payer: Self-pay

## 2018-02-25 NOTE — Telephone Encounter (Signed)
  Follow up Call-  Call back number 02/24/2018  Post procedure Call Back phone  # [phone]  Permission to leave phone message Yes  Some recent data might be hidden     Patient questions:  Do you have a fever, pain , or abdominal swelling? No. Pain Score  0 *  Have you tolerated food without any problems? Yes.    Have you been able to return to your normal activities? Yes.    Do you have any questions about your discharge instructions: Diet   No. Medications  No. Follow up visit  No.  Do you have questions or concerns about your Care? No.  Actions: * If pain score is 4 or above: No action needed, pain <4.

## 2018-03-08 ENCOUNTER — Encounter: Payer: Self-pay | Admitting: Gastroenterology

## 2018-07-05 ENCOUNTER — Other Ambulatory Visit: Payer: Self-pay | Admitting: Family Medicine

## 2018-07-05 DIAGNOSIS — Z1231 Encounter for screening mammogram for malignant neoplasm of breast: Secondary | ICD-10-CM

## 2018-08-16 ENCOUNTER — Telehealth: Payer: Self-pay | Admitting: Family Medicine

## 2018-08-16 NOTE — Telephone Encounter (Signed)
Copied from [place] [id number]. Topic: General - Other >> Aug 16, 2018  2:51 PM Brandi Stevens wrote: Reason for CRM: Pt had covid test done on June 5th by [facility]/ Pt is A symptomatic for covid and wanted it to be noted in her chart. She asked the testing site to send over her results/ She has only experiences light headedness / please advise

## 2018-08-25 ENCOUNTER — Other Ambulatory Visit: Payer: 59

## 2018-08-25 ENCOUNTER — Telehealth (INDEPENDENT_AMBULATORY_CARE_PROVIDER_SITE_OTHER): Payer: 59 | Admitting: Family Medicine

## 2018-08-25 ENCOUNTER — Telehealth: Payer: Self-pay | Admitting: *Deleted

## 2018-08-25 ENCOUNTER — Telehealth: Payer: Self-pay | Admitting: Emergency Medicine

## 2018-08-25 DIAGNOSIS — Z20822 Contact with and (suspected) exposure to covid-19: Secondary | ICD-10-CM

## 2018-08-25 DIAGNOSIS — U071 COVID-19: Secondary | ICD-10-CM

## 2018-08-25 NOTE — Patient Instructions (Addendum)
     If you have lab work done today you will be contacted with your lab results within the next 2 weeks.  If you have not heard from [place] then please contact us. The fastest way to get your results is to register for My Chart. Will notify pt when test results available  IF you received an x-ray today, you will receive an invoice from [facility]. Please contact [facility] at [phone] with questions or concerns regarding your invoice.   IF you received labwork today, you will receive an invoice from [facility]. Please contact [facility] at [phone] with questions or concerns regarding your invoice.   Our billing staff will not be able to assist you with questions regarding bills from these companies.  You will be contacted with the lab results as soon as they are available. The fastest way to get your results is to activate your My Chart account. Instructions are located on the last page of this paperwork. If you have not heard from [place] regarding the results in 2 weeks, please contact this office.

## 2018-08-25 NOTE — Progress Notes (Signed)
Telemedicine Encounter- SOAP NOTE Established Patient  I discussed the limitations, risks, security and privacy concerns of performing an evaluation and management service by telephone and the availability of in person appointments. I also discussed with the patient that there may be a patient responsible charge related to this service. The patient expressed understanding and agreed to proceed.  This telephone encounter was conducted with the patient's  verbal consent via audio telecommunications: yes Patient was instructed to have this encounter in a suitably private space; and to only have persons present to whom they give permission to participate. In addition, patient identity was confirmed by use of name plus two identifiers (DOB and address).  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  Work is requiring her to have neg test results before can return to work. Patient does not have any symptoms.  KALI DEADWYLER is a 63 y.o.  Female established patient. Telephone visit today for COVID positive-needs retesting. No symptoms. 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 Osteoporosis 05/27/2017  . Vitamin D deficiency 05/09/2017  . Submandibular lymphadenopathy 10/12/2013  . Routine health maintenance 04/12/2013  . Sinus bradycardia 05/27/2012  . Graves disease 05/27/2012  . Allergic conjunctivitis and rhinitis 05/27/2012  . Rotator cuff arthropathy 05/27/2012  . 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 Other emphysema ([place]) 05/27/2012  . Special screening for malignant neoplasms, colon 05/27/2012    Past  Medical History:  Diagnosis Date  . Allergy   . Arthritis    Bilateral shoulders.  . Bradycardia    Exercise stress test normal 2008  . Diverticulitis 2012  . Emphysema    Last PFTs 1980s, not on current inhaler thearpy  . 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 Graves disease 1980s   RAI in 2008, not on any thyroid replacement hormone  . Osteoarthritis of shoulder    Bilateral arthritis/bursitis  . Rotator cuff tear arthropathy    R cuff  . SVD (spontaneous vaginal delivery)    x 3  . Tubular adenoma of colon 01/2015    Current Outpatient Medications  Medication Sig Dispense Refill  . acetaminophen (TYLENOL) 650 MG CR tablet Take 650 mg by mouth every 8 (eight) hours as needed for pain.    . cetirizine (ZYRTEC) 10 MG chewable tablet Chew 10 mg by mouth daily.    . cholecalciferol (VITAMIN D) 1000 UNITS tablet Take 1,000 Units by mouth daily.    . diclofenac sodium (VOLTAREN) 1 % GEL Apply 2 g topically daily as needed. For shoulder pain per patient 6 Tube 1  . fluticasone (FLONASE) 50 MCG/ACT nasal spray Place into both nostrils daily.    . Multiple Vitamins-Minerals (CENTRUM SILVER PO) Take 1 tablet by mouth daily.    Marland Kitchen olopatadine (PATANOL) 0.1 % ophthalmic solution 1 drop 2 (two) times daily.     No current facility-administered medications for this visit.     Allergies  Allergen Reactions  . Latex Anaphylaxis    Latex gloves/ skin itch   . Sulfa Antibiotics Shortness Of  Breath and Itching    Also, wheezing    Social History   Socioeconomic History  . Marital status: Divorced    Spouse name: Not on file  . Number of children: 3  . Years of education: Not on file  . Highest education level: Not on file  Occupational History  . Occupation: CNA    Comment: [employer] in [employer]  Social Needs  . Financial resource strain: Not on file  . Food insecurity    Worry: Not on file    Inability: Not on file  . Transportation needs    Medical: Not on file     Non-medical: Not on file  Tobacco Use  . Smoking status: Former Smoker    Packs/day: 1.00    Years: 20.00    Pack years: 20.00    Types: Cigarettes    Start date: 11/08/2011  . Smokeless tobacco: Never Used  Substance and Sexual Activity  . Alcohol use: No    Alcohol/week: 0.0 standard drinks  . Drug use: No  . Sexual activity: Not Currently    Birth control/protection: Post-menopausal  Lifestyle  . Physical activity    Days per week: Not on file    Minutes per session: Not on file  . Stress: Not on file  Relationships  . Social Herbalist on phone: Not on file    Gets together: Not on file    Attends religious service: Not on file    Active member of club or organization: Not on file    Attends meetings of clubs or organizations: Not on file    Relationship status: Not on file  . Intimate partner violence    Fear of current or ex partner: Not on file    Emotionally abused: Not on file    Physically abused: Not on file    Forced sexual activity: Not on file  Other Topics Concern  . Not on file  Social History Narrative   Live in [place], lives alone. Sons nearby.     Review of Systems  Constitutional: Negative for fever, malaise/fatigue and weight loss.  HENT: Negative for congestion and sore throat.   Respiratory: Negative for cough.   Gastrointestinal: Negative for diarrhea.  Musculoskeletal: Negative for myalgias.  Neurological: Negative for headaches.    Objective   Vitals as reported by the patient:none 1. COVID-19 virus detected Recheck to allow pt to return to work-pt will need copy  I discussed the assessment and treatment plan with the patient. The patient was provided an opportunity to ask questions and all were answered. The patient agreed with the plan and demonstrated an understanding of the instructions.   The patient was advised to call back or seek an in-person evaluation if the symptoms worsen or if the condition fails to improve as  anticipated.  I provided 10 minutes of non-face-to-face time during this encounter.  LISA Hannah Beat, MD  Primary Care at [facility] 08-25-18

## 2018-08-25 NOTE — Progress Notes (Signed)
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 So work is requiring her to have a  neg test results before can return to work. Patient di not and do not have any symptoms.

## 2018-08-25 NOTE — Telephone Encounter (Signed)
Patient returning call - patient scheduled for testing and order placed

## 2018-08-25 NOTE — Telephone Encounter (Signed)
Patient need to be tested for covid- Positive exposure

## 2018-08-27 LAB — NOVEL CORONAVIRUS, NAA: SARS-CoV-2, NAA: NOT DETECTED

## 2018-08-30 ENCOUNTER — Ambulatory Visit: Payer: 59

## 2018-09-02 ENCOUNTER — Ambulatory Visit
Admission: RE | Admit: 2018-09-02 | Discharge: 2018-09-02 | Disposition: A | Discharge status: Home / Self Care | Payer: 59 | Source: Ambulatory Visit | Attending: Family Medicine | Admitting: Family Medicine

## 2018-09-02 DIAGNOSIS — Z1231 Encounter for screening mammogram for malignant neoplasm of breast: Secondary | ICD-10-CM

## 2018-11-04 ENCOUNTER — Telehealth: Payer: 59 | Admitting: Family Medicine

## 2018-11-10 ENCOUNTER — Other Ambulatory Visit: Payer: Self-pay | Admitting: Otolaryngology

## 2018-11-10 DIAGNOSIS — R221 Localized swelling, mass and lump, neck: Secondary | ICD-10-CM

## 2018-11-10 DIAGNOSIS — R220 Localized swelling, mass and lump, head: Secondary | ICD-10-CM

## 2018-11-17 ENCOUNTER — Other Ambulatory Visit: Payer: Self-pay

## 2018-11-17 ENCOUNTER — Ambulatory Visit
Admission: RE | Admit: 2018-11-17 | Discharge: 2018-11-17 | Disposition: A | Discharge status: Home / Self Care | Payer: 59 | Source: Ambulatory Visit | Attending: Otolaryngology | Admitting: Otolaryngology

## 2018-11-17 DIAGNOSIS — R220 Localized swelling, mass and lump, head: Secondary | ICD-10-CM

## 2018-11-17 MED ORDER — IOPAMIDOL (ISOVUE-300) INJECTION 61%
75.0000 mL | Freq: Once | INTRAVENOUS | Status: AC | PRN
  Administered 2018-11-17: 75 mL via INTRAVENOUS

## 2019-01-20 ENCOUNTER — Other Ambulatory Visit: Payer: Self-pay

## 2019-01-20 ENCOUNTER — Telehealth (INDEPENDENT_AMBULATORY_CARE_PROVIDER_SITE_OTHER): Payer: 59 | Admitting: Family Medicine

## 2019-01-20 DIAGNOSIS — K5904 Chronic idiopathic constipation: Secondary | ICD-10-CM

## 2019-01-20 MED ORDER — POLYETHYLENE GLYCOL 3350 17 GM/SCOOP PO POWD: 17.0000 g | Freq: Two times a day (BID) | ORAL | 5 refills | Status: AC | PRN

## 2019-01-20 NOTE — Progress Notes (Signed)
Pt was dx with diverticulitis. Says she is having a flare for the past 2 wks now. She says she also having ibs and was told to ask her pcp to start her on linzess because the daily she's taking is not working. She has taken food out of her diet that causes flares. Pharmacy and meds verified. No anxiety or depression

## 2019-01-20 NOTE — Progress Notes (Signed)
Virtual Visit Note  I connected with patient on 01/20/19 at 555pm by phone and verified that I am speaking with the correct person using two identifiers. Brandi Stevens is currently located at home and patient is currently with them during visit. The provider, Rutherford Guys, MD is located in their office at time of visit.  I discussed the limitations, risks, security and privacy concerns of performing an evaluation and management service by telephone and the availability of in person appointments. I also discussed with the patient that there may be a patient responsible charge related to this service. The patient expressed understanding and agreed to proceed.   CC: constipation  HPI  Has long standing h/o constipation, moves BM about once or twice a week with ducolax, no nausea, no vomiting, distended but not hard Has tried miralax once a day, has not tried higher She tries to eat fiber, walks, drinks water  Colonoscopy in dec 2019 - polyps, no diverticula, tortuous colon, repeat in 3 years    Allergies  Allergen Reactions  . Latex Anaphylaxis    Latex gloves/ skin itch   . Sulfa Antibiotics Shortness Of Breath and Itching    Also, wheezing    Prior to Admission medications   Medication Sig Start Date End Date Taking? Authorizing Provider  acetaminophen (TYLENOL) 650 MG CR tablet Take 650 mg by mouth every 8 (eight) hours as needed for pain.   Yes [provider]  cetirizine (ZYRTEC) 10 MG chewable tablet Chew 10 mg by mouth daily.   Yes [provider]  cholecalciferol (VITAMIN D) 1000 UNITS tablet Take 1,000 Units by mouth daily.   Yes [provider]  diclofenac sodium (VOLTAREN) 1 % GEL Apply 2 g topically daily as needed. For shoulder pain per patient 09/26/16  Yes Posey Boyer, MD  fluticasone [facility]) 50 MCG/ACT nasal spray Place into both nostrils daily.   Yes [provider]  Multiple Vitamins-Minerals (CENTRUM SILVER PO) Take 1  tablet by mouth daily.   Yes [provider]  olopatadine (PATANOL) 0.1 % ophthalmic solution 1 drop 2 (two) times daily.   Yes [provider]    Past Medical History:  Diagnosis Date  . Allergy   . Arthritis    Bilateral shoulders.  . Bradycardia    Exercise stress test normal 2008  . Diverticulitis 2012  . Emphysema    Last PFTs 1980s, not on current inhaler thearpy  . 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 Graves disease 1980s   RAI in 2008, not on any thyroid replacement hormone  . Osteoarthritis of shoulder    Bilateral arthritis/bursitis  . Rotator cuff tear arthropathy    R cuff  . SVD (spontaneous vaginal delivery)    x 3  . Tubular adenoma of colon 01/2015    Past Surgical History:  Procedure Laterality Date  . ABDOMINAL HYSTERECTOMY  1989  . BREAST BIOPSY  12/22/2011   Procedure: BREAST BIOPSY;  Surgeon: Ralene Ok, MD;  Location: [facility];  Service: General;  Laterality: Right;  Excision right breast mass  . BREAST EXCISIONAL BIOPSY    . CHOLECYSTECTOMY  1989  . COLONOSCOPY  01/2015   hx polyps-[facility]  . left eye surgery Left    cysts removed  . MULTIPLE TOOTH EXTRACTIONS    . ROTATOR CUFF REPAIR     R shoulder x2 Dr. [facility]leen [facility]os at [facility]  . SHOULDER ARTHROSCOPY W/ SUPERIOR LABRAL ANTERIOR POSTERIOR LESION REPAIR  right    Social History   Tobacco Use  . Smoking status: Former Smoker    Packs/day: 1.00    Years: 20.00    Pack years: 20.00    Types: Cigarettes    Start date: 11/08/2011  . Smokeless tobacco: Never Used  Substance Use Topics  . Alcohol use: No    Alcohol/week: 0.0 standard drinks    Family History  Problem Relation Age of Onset  . Pancreatic cancer Mother 51       pancreatic  . Hearing loss Father   . Stroke Father 15       ischemic stroke  . Colon cancer Maternal Grandfather 41  . Gastric cancer Maternal Aunt        gastric  . Rectal cancer Neg Hx   . Stomach cancer Neg Hx     ROS Per  hpi   Objective  Vitals as reported by the patient: none  Lab Results  Component Value Date   CREATININE 0.70 11/05/2017   BUN 8 11/05/2017   NA 141 11/05/2017   K 4.4 11/05/2017   CL 101 11/05/2017   CO2 21 11/05/2017    ASSESSMENT and PLAN  1. Chronic idiopathic constipation Discussed treatment options, decided for prep similar to pre-colonoscopy followed with BID miralax dosing. Also discussed mag oxide. Discussed hydration. Consider linzess. Reviewed RTC precautions.  Other orders - polyethylene glycol powder (GLYCOLAX/MIRALAX) 17 GM/SCOOP powder; Take 17 g by mouth 2 (two) times daily as needed.  FOLLOW-UP: 4 weeks   The above assessment and management plan was discussed with the patient. The patient verbalized understanding of and has agreed to the management plan. Patient is aware to call the clinic if symptoms persist or worsen. Patient is aware when to return to the clinic for a follow-up visit. Patient educated on when it is appropriate to go to the emergency department.    I provided 20 minutes of non-face-to-face time during this encounter.  Rutherford Guys, MD Primary Care at [place], [place] [address] Ph.  [phone] Fax [phone]

## 2019-01-20 NOTE — Patient Instructions (Signed)
For constipation   Make sure you are drinking enough water daily. Make sure you are getting enough fiber in your diet - this will make you regular - you can eat high fiber foods or use metamucil as a supplement - it is really important to drink enough water when using fiber supplements.  If your stools are hard or are formed balls or you have to strain a stool softener will help - use colace 2-3 capsule a day  For gentle treatment of constipation Use Miralax 1-2 capfuls a day until your stools are soft and regular and then decrease the usage - you can use this daily  For more aggressive treatment of constipation Use 4 capfuls of Colace and 6 doses of Miralax and drink it in 2 hours - this should result in several watery stools - if it does not repeat the next day and then go to daily miralax for a week to make sure your bowels are clean and retrained to work properly  For the most aggressive treatment of constipation Use 14 capfuls of Miralax in 1 gallon of fluid (gatoraid or water work well or a combination of the two) and drink over 12h - it is ok to eat during this time and then use Miralax 1 capful daily for about 2 weeks to prevent the constipation from returning 

## 2019-03-06 ENCOUNTER — Telehealth (INDEPENDENT_AMBULATORY_CARE_PROVIDER_SITE_OTHER): Payer: 59 | Admitting: Family Medicine

## 2019-03-06 ENCOUNTER — Other Ambulatory Visit: Payer: Self-pay

## 2019-03-06 DIAGNOSIS — K581 Irritable bowel syndrome with constipation: Secondary | ICD-10-CM

## 2019-03-06 MED ORDER — LINACLOTIDE 72 MCG PO CAPS: 72.0000 ug | ORAL_CAPSULE | Freq: Every day | ORAL | 2 refills | Status: DC

## 2019-03-06 MED ORDER — FLUTICASONE PROPIONATE 50 MCG/ACT NA SUSP: 1.0000 | Freq: Every day | NASAL | 5 refills | Status: DC

## 2019-03-06 MED ORDER — OLOPATADINE HCL 0.1 % OP SOLN: 1.0000 [drp] | Freq: Two times a day (BID) | OPHTHALMIC | 5 refills | Status: DC

## 2019-03-06 NOTE — Progress Notes (Signed)
Pt is follow ing up with the dx of constipation. The miralax does help, although it does make her gassy. The stool is soften, however she is still having to use suppositories in order for the stool to come out. She is wanting to know if lactulose will be a good option for her. No change to medication. Pharmacy updated. Also requesting refills on pended medication.

## 2019-03-06 NOTE — Progress Notes (Signed)
Virtual Visit Note  I connected with patient on 03/06/19 at 1016am by phone and verified that I am speaking with the correct person using two identifiers. Brandi Stevens is currently located at home and patient is currently with them during visit. The provider, Rutherford Guys, MD is located in their office at time of visit.  I discussed the limitations, risks, security and privacy concerns of performing an evaluation and management service by telephone and the availability of in person appointments. I also discussed with the patient that there may be a patient responsible charge related to this service. The patient expressed understanding and agreed to proceed.   CC: constipation  HPI ? Last OV a month ago Patient with known IBS with constipation Has been doing high dose miralax but still having to use suppositories as unable to fully move having soft stool but formed miralax makes her very gassy In the past tried [place] which only caused burning stomach pain, did nothing for her constipation  Allergies  Allergen Reactions  . Latex Anaphylaxis    Latex gloves/ skin itch   . Sulfa Antibiotics Shortness Of Breath and Itching    Also, wheezing    Prior to Admission medications   Medication Sig Start Date End Date Taking? Authorizing Provider  acetaminophen (TYLENOL) 650 MG CR tablet Take 650 mg by mouth every 8 (eight) hours as needed for pain.   Yes [provider]  cetirizine (ZYRTEC) 10 MG chewable tablet Chew 10 mg by mouth daily.   Yes [provider]  cholecalciferol (VITAMIN D) 1000 UNITS tablet Take 1,000 Units by mouth daily.   Yes [provider]  diclofenac sodium (VOLTAREN) 1 % GEL Apply 2 g topically daily as needed. For shoulder pain per patient 09/26/16  Yes Posey Boyer, MD  fluticasone [facility]) 50 MCG/ACT nasal spray Place into both nostrils daily.   Yes [provider]  Multiple Vitamins-Minerals (CENTRUM SILVER PO) Take 1  tablet by mouth daily.   Yes [provider]  olopatadine (PATANOL) 0.1 % ophthalmic solution 1 drop 2 (two) times daily.   Yes [provider]  polyethylene glycol powder (GLYCOLAX/MIRALAX) 17 GM/SCOOP powder Take 17 g by mouth 2 (two) times daily as needed. 01/20/19  Yes Rutherford Guys, MD    Past Medical History:  Diagnosis Date  . Allergy   . Arthritis    Bilateral shoulders.  . Bradycardia    Exercise stress test normal 2008  . Diverticulitis 2012  . Emphysema    Last PFTs 1980s, not on current inhaler thearpy  . 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 Graves disease 1980s   RAI in 2008, not on any thyroid replacement hormone  . Osteoarthritis of shoulder    Bilateral arthritis/bursitis  . Rotator cuff tear arthropathy    R cuff  . SVD (spontaneous vaginal delivery)    x 3  . Tubular adenoma of colon 01/2015    Past Surgical History:  Procedure Laterality Date  . ABDOMINAL HYSTERECTOMY  1989  . BREAST BIOPSY  12/22/2011   Procedure: BREAST BIOPSY;  Surgeon: Ralene Ok, MD;  Location: [place];  Service: General;  Laterality: Right;  Excision right breast mass  . BREAST EXCISIONAL BIOPSY    . CHOLECYSTECTOMY  1989  . COLONOSCOPY  01/2015   hx polyps-[facility]  . left eye surgery Left    cysts removed  . MULTIPLE TOOTH EXTRACTIONS    . ROTATOR CUFF REPAIR  R shoulder x2 Dr[facility]leen [facility]os [facility]  . SHOULDER ARTHROSCOPY W/ SUPERIOR LABRAL ANTERIOR POSTERIOR LESION REPAIR  right    Social History   Tobacco Use  . Smoking status: Former Smoker    Packs/day: 1.00    Years: 20.00    Pack years: 20.00    Types: Cigarettes    Start date: 11/08/2011  . Smokeless tobacco: Never Used  Substance Use Topics  . Alcohol use: No    Alcohol/week: 0.0 standard drinks    Family History  Problem Relation Age of Onset  . Pancreatic cancer Mother 69       pancreatic  . Hearing loss Father   . Stroke Father 16       ischemic stroke  .  Colon cancer Maternal Grandfather 6  . Gastric cancer Maternal Aunt        gastric  . Rectal cancer Neg Hx   . Stomach cancer Neg Hx     ROS Per hpi  Objective  Vitals as reported by the patient: none   ASSESSMENT and PLAN  1. Irritable bowel syndrome with constipation Failed metamucil, docusate, miralax, amitiza, trial of linzess, will titrate to effect. Reviewed r/se/b. Consider referring back to GI  Other orders - fluticasone (FLONASE) 50 MCG/ACT nasal spray; Place 1 spray into both nostrils daily. - olopatadine (PATANOL) 0.1 % ophthalmic solution; Place 1 drop into both eyes 2 (two) times daily. - linaclotide (LINZESS) 72 MCG capsule; Take 1 capsule (72 mcg total) by mouth daily before breakfast.  FOLLOW-UP: prn   The above assessment and management plan was discussed with the patient. The patient verbalized understanding of and has agreed to the management plan. Patient is aware to call the clinic if symptoms persist or worsen. Patient is aware when to return to the clinic for a follow-up visit. Patient educated on when it is appropriate to go to the emergency department.    I provided 22 minutes of non-face-to-face time during this encounter.  Rutherford Guys, MD Primary Care at [place], [place] [address] Ph.  [phone] Fax [phone]

## 2019-03-07 ENCOUNTER — Telehealth: Payer: Self-pay | Admitting: Gastroenterology

## 2019-03-07 NOTE — Telephone Encounter (Signed)
Pt called and was advised to ask her PCP to prescribe her Amitiza since the Linzess is not covered. Pt is uncomfortable and needs this asap / please advise

## 2019-03-07 NOTE — Telephone Encounter (Signed)
Patient takes linzess that her PCP prescribed her- she states that her insurance would not pay for it and is asking if we have any samples that she could use. She states she is able to go to the restroom but she does not feel like she is getting empted out. She has tried a few things and nothing is working.

## 2019-03-07 NOTE — Telephone Encounter (Signed)
Spoke to Brandi Stevens, explained why we couldn't give her samples. Patient has been scheduled for a follow up to discuss her constipation with NP Guenter. Patient agrees to the plan.

## 2019-03-07 NOTE — Telephone Encounter (Signed)
Please advise 

## 2019-03-24 ENCOUNTER — Encounter: Payer: Self-pay | Admitting: Nurse Practitioner

## 2019-03-24 ENCOUNTER — Ambulatory Visit (INDEPENDENT_AMBULATORY_CARE_PROVIDER_SITE_OTHER): Payer: 59 | Admitting: Nurse Practitioner

## 2019-03-24 VITALS — BP 104/64 | HR 58 | Temp 97.4°F | Ht 65.0 in | Wt 126.0 lb

## 2019-03-24 DIAGNOSIS — K5909 Other constipation: Secondary | ICD-10-CM

## 2019-03-24 MED ORDER — LUBIPROSTONE 8 MCG PO CAPS: 8.0000 ug | ORAL_CAPSULE | Freq: Two times a day (BID) | ORAL | 0 refills | Status: DC

## 2019-03-24 NOTE — Progress Notes (Signed)
Chief Complaint:    Constipation  IMPRESSION and PLAN:    64 year old female with PMH significant for Graves' disease, adenomatous colon polyps and chronic constipation.  1.  Chronic constipation.  She is taking MiraLAX anywhere from 2-3 times a day in addition to stool softeners and suppositories as needed.  Still have a decreased frequency of soft formed stools.  She is frustrated with refractory constipation. --Insurance would not pay Linzess.  Samples of Amitiza 8 mcg twice daily given.  If this works then will call in a prescription and see if insurance will pay for it.  In the interim patient will contact insurance company about options --Continue with good hydration and high-fiber diet  2. History of adenomatous colon polyps, due for 3-year interval surveillance colonoscopy December 2022.  HPI:     Patient is a 64 year old female known to Dr. Fuller Plan.  She was last seen April 2017.  Her GI history is pertinent for lifelong constipation, adenomatous colon polyps.  Brandi Stevens has been taking MiraLAX 2-3 times a day in addition to stool softeners and suppositories as needed.  She is frustrated about the decreased frequency of bowel movements.  Her stools are soft and formed she just has decreased urge to defecate.  She feels uncomfortable when constipated, wants to have a more regular bowel movement schedule.  She stopped MiraLAX 2 weeks ago due to loss of efficacy.  She has been using suppositories and currently feels purged.  She was given a prescription for Linzess but insurance would not pay for it.  Data Reviewed:   Colonoscopy 02/24/2018.  Bleed exam with good prep Four 6 to 8 mm polyps in the sigmoid colon and in the cecum, removed with a cold snare. Resected and retrieved. - The examination was otherwise normal on direct and retroflexion views.  Surgical [P], cecum, sigmoid, polyp (4) - TUBULAR ADENOMA (X5 FRAGMENTS). - NO HIGH GRADE DYSPLASIA OR MALIGNANCY  Review of  systems:     No chest pain, no SOB, no fevers, no urinary sx   Past Medical History:  Diagnosis Date  . Allergy   . Arthritis    Bilateral shoulders.  . Bradycardia    Exercise stress test normal 2008  . Diverticulitis 2012  . Emphysema    Last PFTs 1980s, not on current inhaler thearpy  . 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 Graves disease 1980s   RAI in 2008, not on any thyroid replacement hormone  . Osteoarthritis of shoulder    Bilateral arthritis/bursitis  . Rotator cuff tear arthropathy    R cuff  . SVD (spontaneous vaginal delivery)    x 3  . Tubular adenoma of colon 01/2015    Patient's surgical history, family medical history, social history, medications and allergies were all reviewed in Epic    Current Outpatient Medications  Medication Sig Dispense Refill  . acetaminophen (TYLENOL) 650 MG CR tablet Take 650 mg by mouth every 8 (eight) hours as needed for pain.    . cetirizine (ZYRTEC) 10 MG chewable tablet Chew 10 mg by mouth daily.    . cholecalciferol (VITAMIN D) 1000 UNITS tablet Take 2,000 Units by mouth daily.     . diclofenac sodium (VOLTAREN) 1 % GEL Apply 2 g topically daily as needed. For shoulder pain per patient 6 Tube 1  . fluticasone (FLONASE) 50 MCG/ACT nasal spray Place 1 spray into both nostrils daily. 16 g 5  . Multiple Vitamins-Minerals (CENTRUM SILVER  PO) Take 1 tablet by mouth daily.    Marland Kitchen olopatadine (PATANOL) 0.1 % ophthalmic solution Place 1 drop into both eyes 2 (two) times daily. 5 mL 5  . polyethylene glycol powder (GLYCOLAX/MIRALAX) 17 GM/SCOOP powder Take 17 g by mouth 2 (two) times daily as needed. 225 g 5   No current facility-administered medications for this visit.    Physical Exam:     BP 104/64 (BP Location: Left Arm, Patient Position: Sitting, Cuff Size: Normal)   Pulse (!) 58   Temp (!) 97.4 F (36.3 C)   Ht 5\' 5"  (1.651 m)   Wt 126 lb (57.2 kg)   SpO2 96%   BMI 20.97 kg/m   GENERAL:  Pleasant female in  NAD PSYCH: : Cooperative, normal affect EENT:  conjunctiva pink, mucous membranes moist, neck supple without masses CARDIAC:  RRR,  no peripheral edema PULM: Normal respiratory effort, lungs CTA bilaterally, no wheezing ABDOMEN:  Nondistended, soft, nontender. No obvious masses, no hepatomegaly,  normal bowel sounds SKIN:  turgor, no lesions seen Musculoskeletal:  Normal muscle tone, normal strength NEURO: Alert and oriented x 3, no focal neurologic deficits   Brandi Stevens , NP 03/24/2019, 8:43 AM

## 2019-03-24 NOTE — Progress Notes (Signed)
Reviewed and agree with management plan.  Josemanuel Eakins T. Tayshawn Purnell, MD FACG  Gastroenterology  

## 2019-03-24 NOTE — Patient Instructions (Signed)
If you are age 63 or older, your body mass index should be between 23-30. Your Body mass index is 20.97 kg/m. If this is out of the aforementioned range listed, please consider follow up with your Primary Care Provider.  If you are age 33 or younger, your body mass index should be between 19-25. Your Body mass index is 20.97 kg/m. If this is out of the aformentioned range listed, please consider follow up with your Primary Care Provider.   You have been given samples of Amitiza 8 mcg - take twice daily with food.  Call if this works for you and we will send a prescription to your pharmacy.  Thank you for choosing me and [facility].   Tye Savoy, NP

## 2019-03-27 ENCOUNTER — Other Ambulatory Visit: Payer: Self-pay

## 2019-03-27 ENCOUNTER — Ambulatory Visit (INDEPENDENT_AMBULATORY_CARE_PROVIDER_SITE_OTHER): Payer: 59 | Admitting: Podiatry

## 2019-03-27 DIAGNOSIS — L989 Disorder of the skin and subcutaneous tissue, unspecified: Secondary | ICD-10-CM

## 2019-03-27 DIAGNOSIS — G5762 Lesion of plantar nerve, left lower limb: Secondary | ICD-10-CM | POA: Diagnosis not present

## 2019-03-27 MED ORDER — MELOXICAM 15 MG PO TABS: 15.0000 mg | ORAL_TABLET | Freq: Every day | ORAL | 1 refills | Status: DC

## 2019-03-27 MED ORDER — GABAPENTIN 100 MG PO CAPS: 100.0000 mg | ORAL_CAPSULE | Freq: Every day | ORAL | 2 refills | Status: DC

## 2019-03-29 ENCOUNTER — Other Ambulatory Visit: Payer: Self-pay

## 2019-03-29 ENCOUNTER — Telehealth: Payer: Self-pay | Admitting: Nurse Practitioner

## 2019-03-29 MED ORDER — LUBIPROSTONE 24 MCG PO CAPS: 24.0000 ug | ORAL_CAPSULE | Freq: Two times a day (BID) | ORAL | 3 refills | Status: DC

## 2019-03-29 NOTE — Telephone Encounter (Signed)
Brandi Stevens Patient calls to give an update. She has had 1 bowel movement on the Amitiza 8 mcg BID. Asking for the "cleanse" and an increased dosage of Amitiza. There were not any samples in the office to offer. Please advise. She is now out of Amitiza. Thanks

## 2019-03-29 NOTE — Telephone Encounter (Signed)
We can see if insurance company will pay for Amitiza.  Have her check with the insurance company if she wants to expedite things.  We can call in 24 mcg twice daily with food.  2 refills.  Thank you

## 2019-03-30 ENCOUNTER — Other Ambulatory Visit: Payer: Self-pay

## 2019-03-30 MED ORDER — LINACLOTIDE 145 MCG PO CAPS: 145.0000 ug | ORAL_CAPSULE | Freq: Every day | ORAL | 3 refills | Status: DC

## 2019-03-30 MED ORDER — GOLYTELY 236 G PO SOLR: ORAL | 0 refills | Status: DC

## 2019-03-30 NOTE — Telephone Encounter (Signed)
Beth, we can give her a miralax purge (the container of miralax in 64 oz of gatorade). Ask her to contact insurance company and she what they will pay for. Thanks

## 2019-03-30 NOTE — Telephone Encounter (Signed)
Linzess and Movantik are on the preferred list. They may require PA also. I have sent the Rx to the pharmacy for Golytely and Linzess.

## 2019-03-30 NOTE — Progress Notes (Signed)
   HPI: 64 y.o. female presenting today as a new patient with a chief complaint of intermittent throbbing pain to toes 1-3 of the left foot that began about 4 years ago. She denies any modifying factors. She has been soaking the foot in the Epsom salt for treatment and states she received a steroid injection in between the toe in the past. Patient is here for further evaluation and treatment.   Past Medical History:  Diagnosis Date  . Allergy   . Arthritis    Bilateral shoulders.  . Bradycardia    Exercise stress test normal 2008  . Diverticulitis 2012  . Emphysema    Last PFTs 1980s, not on current inhaler thearpy  . 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 Graves disease 1980s   RAI in 2008, not on any thyroid replacement hormone  . Osteoarthritis of shoulder    Bilateral arthritis/bursitis  . Rotator cuff tear arthropathy    R cuff  . SVD (spontaneous vaginal delivery)    x 3  . Tubular adenoma of colon 01/2015      Physical Exam: General: The patient is alert and oriented x3 in no acute distress.  Dermatology: Hyperkeratotic lesion(s) present on the left sub-fifth MPJ. Pain on palpation with a central nucleated core noted. Skin is warm, dry and supple bilateral lower extremities. Negative for open lesions or macerations.  Vascular: Palpable pedal pulses bilaterally. No edema or erythema noted. Capillary refill within normal limits.  Neurological: Epicritic and protective threshold grossly intact bilaterally.   Musculoskeletal Exam: Sharp pain with palpation of the 2nd interspace and lateral compression of the metatarsal heads consistent with neuroma.  Positive Conley Canal sign with loadbearing of the forefoot.  Assessment: 1.  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 Porokeratosis sub-fifth MPJ left   Plan of Care:  1. Patient was evaluated.   2. Excisional debridement of keratotic lesion(s) using a chisel blade was performed without incident. Light dressing  applied.  3. Injection of 0.5 mLs Celestone Soluspan injected into the 2nd interspace of the left foot.  4. Met pads dispensed.  5. Prescription for Gabapentin 100 mg QHS provided to patient.  6. Prescription for Meloxicam provided to patient. 7. Appointment with Liliane Channel, Pedorthist, for custom molded orthotics.  8. Return to clinic in 4 weeks.   CNA working for herself.    Edrick Kins, DPM [facility]  Dr. Edrick Kins, [address]                                        [place], [place] [address]                Office [phone]  Fax [phone]

## 2019-03-30 NOTE — Telephone Encounter (Signed)
Called and spoke with patient-patient reports she has already tried a Miralax purge w/o relief; patient requests that an alternative medication (instead of Amitiza) RX be sent to the pharmacy due to her constipation "really getting bad and I am about to sue someone if I can get something done to take care of this"; patient is verbally upset in that insurance is "giving me the run around- like I am running around the [place]berry bush";   Please advise if alternative from Amitiza can be sent to pharmacy-or does the patient need to wait on the PA for Amitiza?

## 2019-03-30 NOTE — Telephone Encounter (Signed)
Brandi Stevens, she can try a golytely prep. I cannot help that insurance company will not pay for these medications. I am happy to call in something different but did she call her insurance company to find out what they will pay for regarding constipation? What ever they will cover I will be more than likely fine calling in for her

## 2019-03-30 NOTE — Telephone Encounter (Signed)
Amitiza is not covered. Pharmacy has faxed over the information. Peter Congo would have this in her work for when she has desk time. What would the patient need to do in the meantime?

## 2019-03-30 NOTE — Telephone Encounter (Signed)
Patient is aware of the Rxs and understands that there may still be a problem with the insurance.

## 2019-03-30 NOTE — Telephone Encounter (Signed)
Brandi Stevens, What about the "cleanse?" Would you recommend she purge? It has been several days since she had a bowel movement.  Thanks

## 2019-03-31 ENCOUNTER — Telehealth: Payer: Self-pay | Admitting: Nurse Practitioner

## 2019-03-31 NOTE — Telephone Encounter (Signed)
The pt was advised that she can use apple juice or liquid of her choice for her bowel purge.  She wanted to thank the office for all the help getting her medication.

## 2019-03-31 NOTE — Telephone Encounter (Signed)
Amitiza Rx cancelled. Linzess is covered and has been picked up by the patient. Confirmed with the pharmacy.

## 2019-04-26 ENCOUNTER — Ambulatory Visit: Payer: 59 | Admitting: Podiatry

## 2019-07-12 ENCOUNTER — Other Ambulatory Visit: Payer: Self-pay

## 2019-07-12 ENCOUNTER — Ambulatory Visit (INDEPENDENT_AMBULATORY_CARE_PROVIDER_SITE_OTHER): Payer: 59 | Admitting: Registered Nurse

## 2019-07-12 ENCOUNTER — Encounter: Payer: Self-pay | Admitting: Registered Nurse

## 2019-07-12 VITALS — BP 132/81 | HR 66 | Temp 97.4°F | Resp 16 | Ht 65.0 in | Wt 128.6 lb

## 2019-07-12 DIAGNOSIS — S20469A Insect bite (nonvenomous) of unspecified back wall of thorax, initial encounter: Secondary | ICD-10-CM

## 2019-07-12 DIAGNOSIS — W57XXXA Bitten or stung by nonvenomous insect and other nonvenomous arthropods, initial encounter: Secondary | ICD-10-CM | POA: Diagnosis not present

## 2019-07-12 NOTE — Patient Instructions (Signed)
° ° ° °  If you have lab work done today you will be contacted with your lab results within the next 2 weeks.  If you have not heard from us then please contact us. The fastest way to get your results is to register for My Chart. ° ° °IF you received an x-ray today, you will receive an invoice from [facility]. Please contact [facility] at [phone] with questions or concerns regarding your invoice.  ° °IF you received labwork today, you will receive an invoice from [facility]. Please contact [facility] at [phone] with questions or concerns regarding your invoice.  ° °Our billing staff will not be able to assist you with questions regarding bills from these companies. ° °You will be contacted with the lab results as soon as they are available. The fastest way to get your results is to activate your My Chart account. Instructions are located on the last page of this paperwork. If you have not heard from us regarding the results in 2 weeks, please contact this office. °  ° ° ° °

## 2019-07-15 LAB — LYMEAB(IGG/M)+RMSF(IGG/M)
LYME DISEASE AB, QUANT, IGM: <0.8 index (ref 0.00–0.79)
Lyme IgG/IgM Ab: <0.91 {ISR} (ref 0.00–0.90)
RMSF IgG: NEGATIVE
RMSF IgM: 0.21 index (ref 0.00–0.89)

## 2019-07-17 ENCOUNTER — Encounter: Payer: Self-pay | Admitting: Registered Nurse

## 2019-07-18 ENCOUNTER — Telehealth: Payer: Self-pay | Admitting: Family Medicine

## 2019-07-18 NOTE — Telephone Encounter (Signed)
Pt saw you for tick bite last week, waiting on rocky mountain fever test? And 2 medications you said you were going to send in for her unsure what meds theres none marked in her last note.

## 2019-07-18 NOTE — Telephone Encounter (Signed)
Patient is calling looking for 2 new meds from [facility] visit last week /  For skin breakout and tick bite . Also needs a call regarding recent labs . Worried about lyme disease or Rocky [place] Fever    Please reach out to patient   Patient wants new meds discussed to  [facility], [address]  [place], [place] [address]  Phone:  [phone] Fax:  [phone]  DEA #:  [id number]

## 2019-07-19 ENCOUNTER — Other Ambulatory Visit: Payer: Self-pay | Admitting: Registered Nurse

## 2019-07-19 DIAGNOSIS — L299 Pruritus, unspecified: Secondary | ICD-10-CM

## 2019-07-19 MED ORDER — HYDROXYZINE HCL 10 MG PO TABS: 10.0000 mg | ORAL_TABLET | Freq: Every day | ORAL | 0 refills | Status: DC

## 2019-07-19 MED ORDER — BETAMETHASONE DIPROPIONATE 0.05 % EX CREA: TOPICAL_CREAM | Freq: Two times a day (BID) | CUTANEOUS | 0 refills | Status: DC

## 2019-07-19 NOTE — Telephone Encounter (Signed)
Sent Thanks Rich

## 2019-08-03 ENCOUNTER — Other Ambulatory Visit: Payer: Self-pay | Admitting: Family Medicine

## 2019-08-03 DIAGNOSIS — Z1231 Encounter for screening mammogram for malignant neoplasm of breast: Secondary | ICD-10-CM

## 2019-08-09 ENCOUNTER — Other Ambulatory Visit: Payer: Self-pay | Admitting: Family Medicine

## 2019-08-09 DIAGNOSIS — L299 Pruritus, unspecified: Secondary | ICD-10-CM

## 2019-08-09 MED ORDER — BETAMETHASONE DIPROPIONATE 0.05 % EX CREA: TOPICAL_CREAM | Freq: Two times a day (BID) | CUTANEOUS | 0 refills | Status: DC

## 2019-08-09 NOTE — Telephone Encounter (Signed)
Copied from [place] [id number]. Topic: Quick Communication - Rx Refill/Question >> Aug 09, 2019  3:45 PM Mcneil, Ja-Kwan wrote: Medication: betamethasone dipropionate 0.05 % cream  Has the patient contacted their pharmacy? no  Preferred Pharmacy (with phone number or street name): [id number] - [address], [address]  Phone: [phone]   Fax: [phone]  Agent: Please be advised that RX refills may take up to 3 business days. We ask that you follow-up with your pharmacy.

## 2019-08-09 NOTE — Telephone Encounter (Signed)
Patient is requesting a refill of the following medications: Requested Prescriptions   Pending Prescriptions Disp Refills  . betamethasone dipropionate 0.05 % cream 30 g 0    Sig: Apply topically 2 (two) times daily.    Date of patient request: 08/09/2019 Last office visit: 07/12/2019 Date of last refill: 07/19/2019 Last refill amount: 30 g Follow up time period per chart: 09/05/2019

## 2019-08-09 NOTE — Telephone Encounter (Signed)
Requested medication (s) are due for refill today: Yes  Requested medication (s) are on the active medication list: Yes  Last refill:  07/19/19  Future visit scheduled: Yes  Notes to clinic:  See request.    Requested Prescriptions  Pending Prescriptions Disp Refills   betamethasone dipropionate 0.05 % cream 30 g 0    Sig: Apply topically 2 (two) times daily.      Off-Protocol Failed - 08/09/2019  3:50 PM      Failed - Medication not assigned to a protocol, review manually.      Passed - Valid encounter within last 12 months    Recent Outpatient Visits           4 weeks ago Tick bite, initial encounter   [facility] at [facility], [place], NP   5 months ago Irritable bowel syndrome with constipation   [facility], Lilia Argue, MD   6 months ago Chronic idiopathic constipation   [facility], Lilia Argue, MD   11 months ago COVID-19 virus detected   Primary Care at [facility], Rex Kras, MD   1 year ago Abdominal bloating   Primary Care at [facility], Gelene Mink, PA-C       Future Appointments             In 3 weeks Rutherford Guys, MD Primary Care at [place], [facility]

## 2019-08-24 ENCOUNTER — Encounter: Payer: Self-pay | Admitting: Registered Nurse

## 2019-08-24 NOTE — Progress Notes (Signed)
Established Patient Office Visit  Subjective:  Patient ID: Brandi Stevens, female    DOB: 1955-09-21  Age: 64 y.o. MRN: [id number]  CC:  Chief Complaint  Patient presents with  . Tick Removal    patient states she been bit by a tick atleast 3 times and when she got bit on the back states it feels something is still sticking her. Also have a rash on both elbows that she is using creams for but still there after a month.    HPI Brandi Stevens presents for tick removal  Had found one on back. Small, had not been on long. No concerns for symptoms of disease at this time. Does not think tick was on long enough to have high risk for transmission.  Feels like there may still be mouthparts lingering - feeling a firm small lump. No local redness or drainage.   Past Medical History:  Diagnosis Date  . Allergy   . Arthritis    Bilateral shoulders.  . Bradycardia    Exercise stress test normal 2008  . Diverticulitis 2012  . Emphysema    Last PFTs 1980s, not on current inhaler thearpy  . 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 Graves disease 1980s   RAI in 2008, not on any thyroid replacement hormone  . Osteoarthritis of shoulder    Bilateral arthritis/bursitis  . Rotator cuff tear arthropathy    R cuff  . SVD (spontaneous vaginal delivery)    x 3  . Tubular adenoma of colon 01/2015    Past Surgical History:  Procedure Laterality Date  . ABDOMINAL HYSTERECTOMY  1989  . BREAST BIOPSY  12/22/2011   Procedure: BREAST BIOPSY;  Surgeon: Ralene Ok, MD;  Location: [facility];  Service: General;  Laterality: Right;  Excision right breast mass  . BREAST EXCISIONAL BIOPSY    . CHOLECYSTECTOMY  1989  . COLONOSCOPY  01/2015   hx polyps-[facility]  . left eye surgery Left    cysts removed  . MULTIPLE TOOTH EXTRACTIONS    . ROTATOR CUFF REPAIR     R shoulder x2 Dr. Kayleen Memos at [facility]  . SHOULDER ARTHROSCOPY W/ SUPERIOR LABRAL ANTERIOR POSTERIOR LESION REPAIR  right     Family History  Problem Relation Age of Onset  . Pancreatic cancer Mother 81       pancreatic  . Hearing loss Father   . Stroke Father 81       ischemic stroke  . Colon cancer Maternal Grandfather 43  . Gastric cancer Maternal Aunt        gastric  . Rectal cancer Neg Hx   . Stomach cancer Neg Hx     Social History   Socioeconomic History  . Marital status: Divorced    Spouse name: Not on file  . Number of children: 3  . Years of education: Not on file  . Highest education level: Not on file  Occupational History  . 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 Smoking status: Former Smoker    Packs/day: 1.00    Years: 20.00    Pack years: 20.00    Types: Cigarettes    Start date: 11/08/2011  . Smokeless tobacco: Never Used  Vaping Use  . Vaping Use: Never used  Substance and Sexual Activity  . Alcohol use: No    Alcohol/week: 0.0 standard drinks  . Drug use: No  . Sexual activity: Not Currently  Birth control/protection: Post-menopausal  Other Topics Concern  . Not on file  Social History Narrative   Live in [place], lives alone. Sons nearby.    Social Determinants of Health   Financial Resource Strain:   . Difficulty of Paying Living Expenses:   Food Insecurity:   . Worried About Charity fundraiser in the Last Year:   . Arboriculturist in the Last Year:   Transportation Needs:   . Film/video editor (Medical):   Marland Kitchen Lack of Transportation (Non-Medical):   Physical Activity:   . Days of Exercise per Week:   . Minutes of Exercise per Session:   Stress:   . Feeling of Stress :   Social Connections:   . Frequency of Communication with Friends and Family:   . Frequency of Social Gatherings with Friends and Family:   . Attends Religious Services:   . Active Member of Clubs or Organizations:   . Attends Archivist Meetings:   Marland Kitchen Marital Status:   Intimate Partner Violence:   . Fear of Current or Ex-Partner:   .  Emotionally Abused:   Marland Kitchen Physically Abused:   . Sexually Abused:     Outpatient Medications Prior to Visit  Medication Sig Dispense Refill  . acetaminophen (TYLENOL) 650 MG CR tablet Take 650 mg by mouth every 8 (eight) hours as needed for pain.    . cetirizine (ZYRTEC) 10 MG chewable tablet Chew 10 mg by mouth daily.    . cholecalciferol (VITAMIN D) 1000 UNITS tablet Take 2,000 Units by mouth daily.     . diclofenac sodium (VOLTAREN) 1 % GEL Apply 2 g topically daily as needed. For shoulder pain per patient 6 Tube 1  . fluticasone (FLONASE) 50 MCG/ACT nasal spray Place 1 spray into both nostrils daily. 16 g 5  . gabapentin (NEURONTIN) 100 MG capsule Take 1 capsule (100 mg total) by mouth at bedtime. 30 capsule 2  . linaclotide (LINZESS) 145 MCG CAPS capsule Take 1 capsule (145 mcg total) by mouth daily before breakfast. 30 capsule 3  . lubiprostone (AMITIZA) 24 MCG capsule Take 1 capsule (24 mcg total) by mouth 2 (two) times daily with a meal. Take with a full glass of water 60 capsule 3  . meloxicam (MOBIC) 15 MG tablet Take 1 tablet (15 mg total) by mouth daily. 30 tablet 1  . Multiple Vitamins-Minerals (CENTRUM SILVER PO) Take 1 tablet by mouth daily.    Marland Kitchen olopatadine (PATANOL) 0.1 % ophthalmic solution Place 1 drop into both eyes 2 (two) times daily. 5 mL 5  . polyethylene glycol (GOLYTELY) 236 g solution Mix by instructions then drink 8 to 10 ounces once an hour 4000 mL 0  . polyethylene glycol powder (GLYCOLAX/MIRALAX) 17 GM/SCOOP powder Take 17 g by mouth 2 (two) times daily as needed. 225 g 5   No facility-administered medications prior to visit.    Allergies  Allergen Reactions  . Latex Anaphylaxis    Latex gloves/ skin itch   . Sulfa Antibiotics Shortness Of Breath and Itching    Also, wheezing    ROS Review of Systems  Constitutional: Negative.   HENT: Negative.   Eyes: Negative.   Respiratory: Negative.   Cardiovascular: Negative.   Gastrointestinal: Negative.     Endocrine: Negative.   Genitourinary: Negative.   Skin: Negative.   Allergic/Immunologic: Negative.   Neurological: Negative.   Hematological: Negative.   Psychiatric/Behavioral: Negative.   All other systems reviewed and are negative.  Objective:    Physical Exam Vitals and nursing note reviewed.  Constitutional:      Appearance: Normal appearance. She is normal weight.  Cardiovascular:     Rate and Rhythm: Normal rate and regular rhythm.  Skin:    General: Skin is warm and dry.     Coloration: Skin is not jaundiced or pale.     Findings: No bruising, erythema, lesion or rash.     Comments: Small piece of tick mouthpart removed from right mid back  Neurological:     Mental Status: She is alert.  Psychiatric:        Mood and Affect: Mood normal.        Behavior: Behavior normal.        Thought Content: Thought content normal.        Judgment: Judgment normal.     BP 132/81   Pulse 66   Temp (!) 97.4 F (36.3 C) (Temporal)   Resp 16   Ht 5\' 5"  (1.651 m)   Wt 128 lb 9.6 oz (58.3 kg)   SpO2 100%   BMI 21.40 kg/m  Wt Readings from Last 3 Encounters:  07/12/19 128 lb 9.6 oz (58.3 kg)  03/24/19 126 lb (57.2 kg)  02/24/18 128 lb (58.1 kg)     Health Maintenance Due  Topic Date Due  . COVID-19 Vaccine (1) Never done    There are no preventive care reminders to display for this patient.  Lab Results  Component Value Date   TSH 2.680 04/30/2017   Lab Results  Component Value Date   WBC 5.6 11/05/2017   HGB 14.7 11/05/2017   HCT 44.9 11/05/2017   MCV 90 11/05/2017   PLT 323 11/05/2017   Lab Results  Component Value Date   NA 141 11/05/2017   K 4.4 11/05/2017   CO2 21 11/05/2017   GLUCOSE 81 11/05/2017   BUN 8 11/05/2017   CREATININE 0.70 11/05/2017   BILITOT 0.3 11/05/2017   ALKPHOS 93 11/05/2017   AST 16 11/05/2017   ALT 18 11/05/2017   PROT 7.3 11/05/2017   ALBUMIN 4.9 (H) 11/05/2017   CALCIUM 10.2 11/05/2017   GFR 100.46 11/06/2014    Lab Results  Component Value Date   CHOL 154 04/30/2017   Lab Results  Component Value Date   HDL 60 04/30/2017   Lab Results  Component Value Date   LDLCALC 79 04/30/2017   Lab Results  Component Value Date   TRIG 76 04/30/2017   Lab Results  Component Value Date   CHOLHDL 2.6 04/30/2017   No results found for: HGBA1C    Assessment & Plan:   Problem List Items Addressed This Visit    None    Visit Diagnoses    Tick bite, initial encounter    -  Primary   Relevant Orders   Lyme Ab (IgG/M) + RMSF( IgG/M) (Completed)      No orders of the defined types were placed in this encounter.   Follow-up: No follow-ups on file.   PLAN  Removed apparent mouthparts  Counseled on symptoms of disease, will check lymb ab today  Return precautions given  Patient encouraged to call clinic with any questions, comments, or concerns.  Maximiano Coss, NP

## 2019-09-04 ENCOUNTER — Ambulatory Visit
Admission: RE | Admit: 2019-09-04 | Discharge: 2019-09-04 | Disposition: A | Discharge status: Home / Self Care | Payer: 59 | Source: Ambulatory Visit | Attending: Family Medicine | Admitting: Family Medicine

## 2019-09-04 ENCOUNTER — Other Ambulatory Visit: Payer: Self-pay

## 2019-09-04 DIAGNOSIS — Z1231 Encounter for screening mammogram for malignant neoplasm of breast: Secondary | ICD-10-CM

## 2019-09-05 ENCOUNTER — Ambulatory Visit (INDEPENDENT_AMBULATORY_CARE_PROVIDER_SITE_OTHER): Payer: 59 | Admitting: Family Medicine

## 2019-09-05 ENCOUNTER — Encounter: Payer: Self-pay | Admitting: Family Medicine

## 2019-09-05 ENCOUNTER — Other Ambulatory Visit: Payer: Self-pay

## 2019-09-05 VITALS — BP 124/79 | HR 56 | Temp 97.9°F | Ht 65.0 in | Wt 125.4 lb

## 2019-09-05 DIAGNOSIS — Z13228 Encounter for screening for other metabolic disorders: Secondary | ICD-10-CM | POA: Diagnosis not present

## 2019-09-05 DIAGNOSIS — Z0000 Encounter for general adult medical examination without abnormal findings: Secondary | ICD-10-CM

## 2019-09-05 DIAGNOSIS — Z800 Family history of malignant neoplasm of digestive organs: Secondary | ICD-10-CM

## 2019-09-05 DIAGNOSIS — Z8639 Personal history of other endocrine, nutritional and metabolic disease: Secondary | ICD-10-CM

## 2019-09-05 DIAGNOSIS — H6123 Impacted cerumen, bilateral: Secondary | ICD-10-CM

## 2019-09-05 DIAGNOSIS — E559 Vitamin D deficiency, unspecified: Secondary | ICD-10-CM

## 2019-09-05 DIAGNOSIS — Z0001 Encounter for general adult medical examination with abnormal findings: Secondary | ICD-10-CM | POA: Diagnosis not present

## 2019-09-05 DIAGNOSIS — M810 Age-related osteoporosis without current pathological fracture: Secondary | ICD-10-CM

## 2019-09-05 DIAGNOSIS — Z130 Encounter for screening for diseases of the blood and blood-forming organs and certain disorders involving the immune mechanism: Secondary | ICD-10-CM

## 2019-09-05 DIAGNOSIS — Z13220 Encounter for screening for lipoid disorders: Secondary | ICD-10-CM

## 2019-09-05 NOTE — Progress Notes (Signed)
6/29/20219:40 AM  Brandi Stevens July 04, 1955, 64 y.o., female [id number]  Chief Complaint  Patient presents with  . Annual Exam    wants to have pancreas testing done due to mother having pancreas cancer    HPI:   Patient is a 64 y.o. female with past medical history significant for IBS-C, osteoporosis, vitamin D deficiency who presents today for CPE  Cervical Cancer Screening: s/p hysterectomy for benign reasons Breast Cancer Screening: mammo yesterday Colorectal Cancer Screening: colonoscopy dec 2019, repeat in 3 years, Dr Raynelle Highland Density Testing: 05/2017 osteoporosis, intolerant of high dose vitamin D and fosamax as made her constipation significantly worse, she does take daily vitamin D 2000 units and calcium, walks, works as CNA HIV Screening: UTD HCV Screening: UTD Seasonal Influenza Vaccination: seasonal Td/Tdap Vaccination: due 2024 Pneumococcal Vaccination: at age 8 Zoster Vaccination: declines Frequency of Dental evaluation: Q6 months Frequency of Eye evaluation: needs to make appt, wears eyeglasses  Worried about fhx cancer, mother pancreatic cancer and her brother recently diagnosed with GI cancer, unclear primary   Hearing Screening   '125Hz'$  '250Hz'$  '500Hz'$  '1000Hz'$  '2000Hz'$  '3000Hz'$  '4000Hz'$  '6000Hz'$  '8000Hz'$   Right ear:           Left ear:             Visual Acuity Screening   Right eye Left eye Both eyes  Without correction:     With correction: '20/15 20/20 20/15 '$    Depression screen [facility] 2/9 09/05/2019 07/12/2019 08/25/2018  Decreased Interest 0 0 0  Down, Depressed, Hopeless 0 0 0  PHQ - 2 Score 0 0 0    Fall Risk  09/05/2019 07/12/2019 03/06/2019 08/25/2018 11/05/2017  Falls in the past year? 0 0 0 0 No  Number falls in past yr: 0 0 0 0 -  Injury with Fall? 0 0 0 0 -  Follow up - Falls evaluation completed - - -     Allergies  Allergen Reactions  . Latex Anaphylaxis    Latex gloves/ skin itch   . Sulfa Antibiotics Shortness Of Breath and Itching    Also,  wheezing    Prior to Admission medications   Medication Sig Start Date End Date Taking? Authorizing Provider  acetaminophen (TYLENOL) 650 MG CR tablet Take 650 mg by mouth every 8 (eight) hours as needed for pain.   Yes [provider]  betamethasone dipropionate 0.05 % cream Apply topically 2 (two) times daily. 08/09/19  Yes Rutherford Guys, MD  cetirizine (ZYRTEC) 10 MG chewable tablet Chew 10 mg by mouth daily.   Yes [provider]  cholecalciferol (VITAMIN D) 1000 UNITS tablet Take 2,000 Units by mouth daily.    Yes [provider]  diclofenac sodium (VOLTAREN) 1 % GEL Apply 2 g topically daily as needed. For shoulder pain per patient 09/26/16  Yes Posey Boyer, MD  fluticasone [facility]) 50 MCG/ACT nasal spray Place 1 spray into both nostrils daily. 03/06/19  Yes Rutherford Guys, MD  gabapentin (NEURONTIN) 100 MG capsule Take 1 capsule (100 mg total) by mouth at bedtime. 03/27/19  Yes Edrick Kins, DPM  hydrOXYzine (ATARAX/VISTARIL) 10 MG tablet Take 1 tablet (10 mg total) by mouth at bedtime. 07/19/19  Yes Maximiano Coss, NP  linaclotide [facility]) 145 MCG CAPS capsule Take 1 capsule (145 mcg total) by mouth daily before breakfast. 03/30/19  Yes Willia Craze, NP  lubiprostone (AMITIZA) 24 MCG capsule Take 1 capsule (24 mcg total) by mouth 2 (two) times  daily with a meal. Take with a full glass of water 03/29/19  Yes Willia Craze, NP  meloxicam (MOBIC) 15 MG tablet Take 1 tablet (15 mg total) by mouth daily. 03/27/19  Yes Edrick Kins, DPM  Multiple Vitamins-Minerals (CENTRUM SILVER PO) Take 1 tablet by mouth daily.   Yes [provider]  olopatadine (PATANOL) 0.1 % ophthalmic solution Place 1 drop into both eyes 2 (two) times daily. 03/06/19  Yes Rutherford Guys, MD  polyethylene glycol (GOLYTELY) 236 g solution Mix by instructions then drink 8 to 10 ounces once an hour 03/30/19  Yes Willia Craze, NP  polyethylene glycol powder  (GLYCOLAX/MIRALAX) 17 GM/SCOOP powder Take 17 g by mouth 2 (two) times daily as needed. 01/20/19  Yes Rutherford Guys, MD    Past Medical History:  Diagnosis Date  . Allergy   . Arthritis    Bilateral shoulders.  . Bradycardia    Exercise stress test normal 2008  . Diverticulitis 2012  . Emphysema    Last PFTs 1980s, not on current inhaler thearpy  . 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 Graves disease 1980s   RAI in 2008, not on any thyroid replacement hormone  . Osteoarthritis of shoulder    Bilateral arthritis/bursitis  . Rotator cuff tear arthropathy    R cuff  . SVD (spontaneous vaginal delivery)    x 3  . Tubular adenoma of colon 01/2015    Past Surgical History:  Procedure Laterality Date  . ABDOMINAL HYSTERECTOMY  1989  . BREAST BIOPSY  12/22/2011   Procedure: BREAST BIOPSY;  Surgeon: Ralene Ok, MD;  Location: [place];  Service: General;  Laterality: Right;  Excision right breast mass  . BREAST EXCISIONAL BIOPSY    . CHOLECYSTECTOMY  1989  . COLONOSCOPY  01/2015   hx polyps-[facility]  . left eye surgery Left    cysts removed  . MULTIPLE TOOTH EXTRACTIONS    . ROTATOR CUFF REPAIR     R shoulder x2 Dr. Kayleen Memos at [facility]  . SHOULDER ARTHROSCOPY W/ SUPERIOR LABRAL ANTERIOR POSTERIOR LESION REPAIR  right    Social History   Tobacco Use  . Smoking status: Former Smoker    Packs/day: 1.00    Years: 20.00    Pack years: 20.00    Types: Cigarettes    Start date: 11/08/2011  . Smokeless tobacco: Never Used  Substance Use Topics  . Alcohol use: No    Alcohol/week: 0.0 standard drinks    Family History  Problem Relation Age of Onset  . Pancreatic cancer Mother 35       pancreatic  . Hearing loss Father   . Stroke Father 40       ischemic stroke  . Colon cancer Maternal Grandfather 47  . Gastric cancer Maternal Aunt        gastric  . Rectal cancer Neg Hx   . Stomach cancer Neg Hx     Review of Systems  Constitutional: Negative for  chills and fever.  HENT: Positive for hearing loss.   Respiratory: Negative for cough and shortness of breath.   Cardiovascular: Negative for chest pain, palpitations and leg swelling.  Gastrointestinal: Positive for constipation (chronic). Negative for abdominal pain, nausea and vomiting.  All other systems reviewed and are negative. per hpi   OBJECTIVE:  Today's Vitals   09/05/19 0916  BP: 124/79  Pulse: (!) 56  Temp: 97.9 F (36.6 C)  SpO2: 99%  Weight:  125 lb 6.4 oz (56.9 kg)  Height: '5\' 5"'$  (1.651 m)   Body mass index is 20.87 kg/m.   Wt Readings from Last 3 Encounters:  09/05/19 125 lb 6.4 oz (56.9 kg)  07/12/19 128 lb 9.6 oz (58.3 kg)  03/24/19 126 lb (57.2 kg)     Physical Exam Vitals and nursing note reviewed. Exam conducted with a chaperone present.  Constitutional:      Appearance: She is well-developed.  HENT:     Head: Normocephalic and atraumatic.     Right Ear: Hearing, ear canal and external ear normal. There is impacted cerumen (partial removal. TM not fully visualized).     Left Ear: Hearing, tympanic membrane, ear canal and external ear normal. There is impacted cerumen (removed sucessfully by CMA).     Mouth/Throat:     Mouth: Mucous membranes are moist.     Pharynx: No oropharyngeal exudate or posterior oropharyngeal erythema.  Eyes:     Extraocular Movements: Extraocular movements intact.     Conjunctiva/sclera: Conjunctivae normal.     Pupils: Pupils are equal, round, and reactive to light.  Neck:     Thyroid: No thyromegaly.  Cardiovascular:     Rate and Rhythm: Normal rate and regular rhythm.     Heart sounds: Normal heart sounds. No murmur heard.  No friction rub. No gallop.   Pulmonary:     Effort: Pulmonary effort is normal.     Breath sounds: Normal breath sounds. No wheezing, rhonchi or rales.  Chest:     Breasts:        Right: No mass, nipple discharge or skin change.        Left: No mass, nipple discharge or skin change.    Abdominal:     General: Bowel sounds are normal. There is no distension.     Palpations: Abdomen is soft. There is no hepatomegaly, splenomegaly or mass.     Tenderness: There is no abdominal tenderness.  Musculoskeletal:        General: Normal range of motion.     Cervical back: Neck supple.     Right lower leg: No edema.     Left lower leg: No edema.  Lymphadenopathy:     Cervical: No cervical adenopathy.     Upper Body:     Right upper body: No supraclavicular, axillary or pectoral adenopathy.     Left upper body: No supraclavicular, axillary or pectoral adenopathy.  Skin:    General: Skin is warm and dry.  Neurological:     Mental Status: She is alert and oriented to person, place, and time.     Cranial Nerves: No cranial nerve deficit.     Gait: Gait normal.     Deep Tendon Reflexes: Reflexes are normal and symmetric.  Psychiatric:        Mood and Affect: Mood normal.        Behavior: Behavior normal.     No results found for this or any previous visit (from the past 24 hour(s)).  No results found.   ASSESSMENT and PLAN  1. Annual physical exam No concerns per history or exam. Routine HCM labs ordered. HCM reviewed/discussed. Anticipatory guidance regarding healthy weight, lifestyle and choices given.   2. Vitamin D deficiency - VITAMIN D 25 Hydroxy (Vit-D Deficiency, Fractures)  3. Screening for lipid disorders - Lipid panel  4. Screening for metabolic disorder - BTD17+OHYW  5. Screening for deficiency anemia - CBC  6. H/O Graves' disease s/p RAI -  TSH  7. Age-related osteoporosis without current pathological fracture - DG Bone Density; Future - consider referral to specialist if not improved as patient did not tolerate oral bisphophonates  8. Bilateral impacted cerumen - Ear wax removal - lavage performed by CMA, incomplete resolution of RIGHT ear, declines referral to ENT  9. FHx: pancreatic cancer Discussed I did not know of any standardized  screening method, advise she discuss further with GI  Return in about 1 year (around 09/04/2020).    Rutherford Guys, MD Primary [facility] at [facility] [place], [place] [address] Ph.  [phone] Fax [phone]

## 2019-09-05 NOTE — Patient Instructions (Addendum)
   If you have lab work done today you will be contacted with your lab results within the next 2 weeks.  If you have not heard from us then please contact us. The fastest way to get your results is to register for My Chart.   IF you received an x-ray today, you will receive an invoice from [facility]. Please contact [facility] at [phone] with questions or concerns regarding your invoice.   IF you received labwork today, you will receive an invoice from [facility]. Please contact [facility] at [phone] with questions or concerns regarding your invoice.   Our billing staff will not be able to assist you with questions regarding bills from these companies.  You will be contacted with the lab results as soon as they are available. The fastest way to get your results is to activate your My Chart account. Instructions are located on the last page of this paperwork. If you have not heard from us regarding the results in 2 weeks, please contact this office.     Preventive Care 40-64 Years Old, Female Preventive care refers to visits with your health care provider and lifestyle choices that can promote health and wellness. This includes:  A yearly physical exam. This may also be called an annual well check.  Regular dental visits and eye exams.  Immunizations.  Screening for certain conditions.  Healthy lifestyle choices, such as eating a healthy diet, getting regular exercise, not using drugs or products that contain nicotine and tobacco, and limiting alcohol use. What can I expect for my preventive care visit? Physical exam Your health care provider will check your:  Height and weight. This may be used to calculate body mass index (BMI), which tells if you are at a healthy weight.  Heart rate and blood pressure.  Skin for abnormal spots. Counseling Your health care provider may ask you questions about your:  Alcohol, tobacco, and drug use.  Emotional  well-being.  Home and relationship well-being.  Sexual activity.  Eating habits.  Work and work environment.  Method of birth control.  Menstrual cycle.  Pregnancy history. What immunizations do I need?  Influenza (flu) vaccine  This is recommended every year. Tetanus, diphtheria, and pertussis (Tdap) vaccine  You may need a Td booster every 10 years. Varicella (chickenpox) vaccine  You may need this if you have not been vaccinated. Zoster (shingles) vaccine  You may need this after age 60. Measles, mumps, and rubella (MMR) vaccine  You may need at least one dose of MMR if you were born in 1957 or later. You may also need a second dose. Pneumococcal conjugate (PCV13) vaccine  You may need this if you have certain conditions and were not previously vaccinated. Pneumococcal polysaccharide (PPSV23) vaccine  You may need one or two doses if you smoke cigarettes or if you have certain conditions. Meningococcal conjugate (MenACWY) vaccine  You may need this if you have certain conditions. Hepatitis A vaccine  You may need this if you have certain conditions or if you travel or work in places where you may be exposed to hepatitis A. Hepatitis B vaccine  You may need this if you have certain conditions or if you travel or work in places where you may be exposed to hepatitis B. Haemophilus influenzae type b (Hib) vaccine  You may need this if you have certain conditions. Human papillomavirus (HPV) vaccine  If recommended by your health care provider, you may need three doses over 6 months.   You may receive vaccines as individual doses or as more than one vaccine together in one shot (combination vaccines). Talk with your health care provider about the risks and benefits of combination vaccines. What tests do I need? Blood tests  Lipid and cholesterol levels. These may be checked every 5 years, or more frequently if you are over 50 years old.  Hepatitis C  test.  Hepatitis B test. Screening  Lung cancer screening. You may have this screening every year starting at age 55 if you have a 30-pack-year history of smoking and currently smoke or have quit within the past 15 years.  Colorectal cancer screening. All adults should have this screening starting at age 50 and continuing until age 75. Your health care provider may recommend screening at age 45 if you are at increased risk. You will have tests every 1-10 years, depending on your results and the type of screening test.  Diabetes screening. This is done by checking your blood sugar (glucose) after you have not eaten for a while (fasting). You may have this done every 1-3 years.  Mammogram. This may be done every 1-2 years. Talk with your health care provider about when you should start having regular mammograms. This may depend on whether you have a family history of breast cancer.  BRCA-related cancer screening. This may be done if you have a family history of breast, ovarian, tubal, or peritoneal cancers.  Pelvic exam and Pap test. This may be done every 3 years starting at age 21. Starting at age 30, this may be done every 5 years if you have a Pap test in combination with an HPV test. Other tests  Sexually transmitted disease (STD) testing.  Bone density scan. This is done to screen for osteoporosis. You may have this scan if you are at high risk for osteoporosis. Follow these instructions at home: Eating and drinking  Eat a diet that includes fresh fruits and vegetables, whole grains, lean protein, and low-fat dairy.  Take vitamin and mineral supplements as recommended by your health care provider.  Do not drink alcohol if: ? Your health care provider tells you not to drink. ? You are pregnant, may be pregnant, or are planning to become pregnant.  If you drink alcohol: ? Limit how much you have to 0-1 drink a day. ? Be aware of how much alcohol is in your drink. In the [place] one  drink equals one 12 oz bottle of beer (355 mL), one 5 oz glass of wine (148 mL), or one 1 oz glass of hard liquor (44 mL). Lifestyle  Take daily care of your teeth and gums.  Stay active. Exercise for at least 30 minutes on 5 or more days each week.  Do not use any products that contain nicotine or tobacco, such as cigarettes, e-cigarettes, and chewing tobacco. If you need help quitting, ask your health care provider.  If you are sexually active, practice safe sex. Use a condom or other form of birth control (contraception) in order to prevent pregnancy and STIs (sexually transmitted infections).  If told by your health care provider, take low-dose aspirin daily starting at age 50. What's next?  Visit your health care provider once a year for a well check visit.  Ask your health care provider how often you should have your eyes and teeth checked.  Stay up to date on all vaccines. This information is not intended to replace advice given to you by your health care provider. Make sure   you discuss any questions you have with your health care provider. Document Revised: 11/04/2017 Document Reviewed: 11/04/2017 Elsevier Patient Education  2020 Elsevier Inc.  

## 2019-09-06 LAB — CMP14+EGFR
ALT: 7 IU/L (ref 0–32)
AST: 11 IU/L (ref 0–40)
Albumin/Globulin Ratio: 2 (ref 1.2–2.2)
Albumin: 4.6 g/dL (ref 3.8–4.8)
Alkaline Phosphatase: 97 IU/L (ref 48–121)
BUN/Creatinine Ratio: 10 — ABNORMAL LOW (ref 12–28)
BUN: 6 mg/dL — ABNORMAL LOW (ref 8–27)
Bilirubin Total: 0.3 mg/dL (ref 0.0–1.2)
CO2: 22 mmol/L (ref 20–29)
Calcium: 9.6 mg/dL (ref 8.7–10.3)
Chloride: 106 mmol/L (ref 96–106)
Creatinine, Ser: 0.62 mg/dL (ref 0.57–1.00)
GFR calc Af Amer: 111 mL/min/{1.73_m2} (ref >59)
GFR calc non Af Amer: 96 mL/min/{1.73_m2} (ref >59)
Globulin, Total: 2.3 g/dL (ref 1.5–4.5)
Glucose: 94 mg/dL (ref 65–99)
Potassium: 4.2 mmol/L (ref 3.5–5.2)
Sodium: 141 mmol/L (ref 134–144)
Total Protein: 6.9 g/dL (ref 6.0–8.5)

## 2019-09-06 LAB — TSH: TSH: 3.09 u[IU]/mL (ref 0.450–4.500)

## 2019-09-06 LAB — LIPID PANEL
Chol/HDL Ratio: 3.1 ratio (ref 0.0–4.4)
Cholesterol, Total: 174 mg/dL (ref 100–199)
HDL: 57 mg/dL (ref >39)
LDL Chol Calc (NIH): 104 mg/dL — ABNORMAL HIGH (ref 0–99)
Triglycerides: 71 mg/dL (ref 0–149)
VLDL Cholesterol Cal: 13 mg/dL (ref 5–40)

## 2019-09-06 LAB — CBC
Hematocrit: 41.3 % (ref 34.0–46.6)
Hemoglobin: 13.4 g/dL (ref 11.1–15.9)
MCH: 29.2 pg (ref 26.6–33.0)
MCHC: 32.4 g/dL (ref 31.5–35.7)
MCV: 90 fL (ref 79–97)
Platelets: 316 10*3/uL (ref 150–450)
RBC: 4.59 x10E6/uL (ref 3.77–5.28)
RDW: 12.3 % (ref 11.7–15.4)
WBC: 4.2 10*3/uL (ref 3.4–10.8)

## 2019-09-06 LAB — VITAMIN D 25 HYDROXY (VIT D DEFICIENCY, FRACTURES): Vit D, 25-Hydroxy: 13.5 ng/mL — ABNORMAL LOW (ref 30.0–100.0)

## 2019-09-07 ENCOUNTER — Telehealth: Payer: Self-pay | Admitting: Family Medicine

## 2019-09-07 NOTE — Telephone Encounter (Signed)
Pt requesting lab results. Please Advise 

## 2019-09-07 NOTE — Telephone Encounter (Signed)
Patient's concern/request has been addressed via result note

## 2019-09-07 NOTE — Telephone Encounter (Signed)
Pt would like a cb concerning her most recent lab results. Please advise at [phone].

## 2019-09-19 ENCOUNTER — Other Ambulatory Visit: Payer: Self-pay | Admitting: Family Medicine

## 2019-09-19 DIAGNOSIS — M810 Age-related osteoporosis without current pathological fracture: Secondary | ICD-10-CM

## 2019-10-31 ENCOUNTER — Other Ambulatory Visit: Payer: Self-pay

## 2019-10-31 ENCOUNTER — Ambulatory Visit
Admission: RE | Admit: 2019-10-31 | Discharge: 2019-10-31 | Disposition: A | Discharge status: Home / Self Care | Payer: 59 | Source: Ambulatory Visit | Attending: Family Medicine | Admitting: Family Medicine

## 2019-10-31 DIAGNOSIS — M810 Age-related osteoporosis without current pathological fracture: Secondary | ICD-10-CM

## 2019-11-02 ENCOUNTER — Other Ambulatory Visit: Payer: Self-pay | Admitting: Family Medicine

## 2019-11-02 MED ORDER — IBANDRONATE SODIUM 150 MG PO TABS: 150.0000 mg | ORAL_TABLET | ORAL | 4 refills | Status: DC

## 2019-11-30 ENCOUNTER — Telehealth: Payer: Self-pay | Admitting: Family Medicine

## 2019-11-30 NOTE — Telephone Encounter (Signed)
I have spoken with pt and gave Santiago's recommendations and pt understands.

## 2019-11-30 NOTE — Telephone Encounter (Signed)
Spoke with pt and she stated that you prescribed her Boniva back in August but she was very hesitate to take it. She finally took it on 11/28/2019 and she started experiencing nausea, vomiting, diarrhea extreme lower abdominal pain, and fatigue. The vomiting has subsided and the lower abd pain is not as bad. She is still feeling bad with some of the symptoms.  Please Advise.

## 2019-11-30 NOTE — Telephone Encounter (Signed)
Pt took a one time tablet on Tuesday at 8 am. Pt woke up yesterday at 2 am. 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 Reports excruciating pain and would like a call back. Refusing to make an appt. States she has discussed this with provider in the past

## 2019-11-30 NOTE — Telephone Encounter (Signed)
Please tell patient I am sorry she did not do well with medication. Have patient stop taking boniva. Please add to allergy list due to intolerance. Given that she is feeling better, I recommend push fluids, bland diet. ER if unable to maintain hydration, worsening abd pain, blood in stool or emesis, dizzy, lightheaded. Thanks

## 2020-10-02 ENCOUNTER — Ambulatory Visit: Payer: 59 | Admitting: Dermatology

## 2020-12-18 ENCOUNTER — Encounter: Payer: Self-pay | Admitting: Gastroenterology

## 2021-02-04 ENCOUNTER — Other Ambulatory Visit: Payer: Self-pay

## 2021-02-04 ENCOUNTER — Ambulatory Visit (AMBULATORY_SURGERY_CENTER): Payer: 59

## 2021-02-04 VITALS — Ht 65.0 in | Wt 133.0 lb

## 2021-02-04 DIAGNOSIS — Z86010 Personal history of colonic polyps: Secondary | ICD-10-CM

## 2021-02-04 MED ORDER — NA SULFATE-K SULFATE-MG SULF 17.5-3.13-1.6 GM/177ML PO SOLN: 1.0000 | Freq: Once | ORAL | 0 refills | Status: AC

## 2021-02-04 NOTE — Progress Notes (Signed)
No egg or soy allergy known to patient  No issues known to pt with past sedation with any surgeries or procedures----other than PONV Patient denies ever being told they had issues or difficulty with intubation ----other than PONV No FH of Malignant Hyperthermia Pt is not on diet pills Pt is not on home 02  Pt is not on blood thinners  Pt reports issues with constipation - takes Linzess PRN and uses Miralax PRN if needed for relief No A fib or A flutter Pt is fully vaccinated for Covid x 2 + booster; NO PA's for preps discussed with pt in [facility] today  Discussed with pt there will be an out-of-pocket cost for prep and that varies from $0 to 70 + dollars - pt verbalized understanding  Due to the COVID-19 pandemic we are asking patients to follow certain guidelines in PV and the [facility]   Pt aware of COVID protocols and [facility] guidelines

## 2021-02-20 ENCOUNTER — Ambulatory Visit (AMBULATORY_SURGERY_CENTER): Payer: 59 | Admitting: Gastroenterology

## 2021-02-20 ENCOUNTER — Other Ambulatory Visit: Payer: Self-pay

## 2021-02-20 ENCOUNTER — Encounter: Payer: Self-pay | Admitting: Gastroenterology

## 2021-02-20 VITALS — BP 117/65 | HR 50 | Temp 97.9°F | Resp 20 | Ht 65.0 in | Wt 133.0 lb

## 2021-02-20 DIAGNOSIS — D122 Benign neoplasm of ascending colon: Secondary | ICD-10-CM

## 2021-02-20 DIAGNOSIS — Z86010 Personal history of colonic polyps: Secondary | ICD-10-CM

## 2021-02-20 DIAGNOSIS — D123 Benign neoplasm of transverse colon: Secondary | ICD-10-CM | POA: Diagnosis not present

## 2021-02-20 DIAGNOSIS — D120 Benign neoplasm of cecum: Secondary | ICD-10-CM | POA: Diagnosis not present

## 2021-02-20 MED ORDER — SODIUM CHLORIDE 0.9 % IV SOLN: 500.0000 mL | Freq: Once | INTRAVENOUS | Status: DC

## 2021-02-20 NOTE — Progress Notes (Signed)
Pt's states no medical or surgical changes since previsit or office visit.  ° °Vitals CW °

## 2021-02-20 NOTE — Patient Instructions (Signed)
Handout provided on polyps.   YOU HAD AN ENDOSCOPIC PROCEDURE TODAY AT THE [facility]:   Refer to the procedure report that was given to you for any specific questions about what was found during the examination.  If the procedure report does not answer your questions, please call your gastroenterologist to clarify.  If you requested that your care partner not be given the details of your procedure findings, then the procedure report has been included in a sealed envelope for you to review at your convenience later.  YOU SHOULD EXPECT: Some feelings of bloating in the abdomen. Passage of more gas than usual.  Walking can help get rid of the air that was put into your GI tract during the procedure and reduce the bloating. If you had a lower endoscopy (such as a colonoscopy or flexible sigmoidoscopy) you may notice spotting of blood in your stool or on the toilet paper. If you underwent a bowel prep for your procedure, you may not have a normal bowel movement for a few days.  Please Note:  You might notice some irritation and congestion in your nose or some drainage.  This is from the oxygen used during your procedure.  There is no need for concern and it should clear up in a day or so.  SYMPTOMS TO REPORT IMMEDIATELY:  Following lower endoscopy (colonoscopy or flexible sigmoidoscopy):  Excessive amounts of blood in the stool  Significant tenderness or worsening of abdominal pains  Swelling of the abdomen that is new, acute  Fever of 100F or higher  For urgent or emergent issues, a gastroenterologist can be reached at any hour by calling [phone]. Do not use MyChart messaging for urgent concerns.    DIET:  We do recommend a small meal at first, but then you may proceed to your regular diet.  Drink plenty of fluids but you should avoid alcoholic beverages for 24 hours.  ACTIVITY:  You should plan to take it easy for the rest of today and you should NOT DRIVE or use heavy  machinery until tomorrow (because of the sedation medicines used during the test).    FOLLOW UP: Our staff will call the number listed on your records 48-72 hours following your procedure to check on you and address any questions or concerns that you may have regarding the information given to you following your procedure. If we do not reach you, we will leave a message.  We will attempt to reach you two times.  During this call, we will ask if you have developed any symptoms of COVID 19. If you develop any symptoms (ie: fever, flu-like symptoms, shortness of breath, cough etc.) before then, please call [phone].  If you test positive for Covid 19 in the 2 weeks post procedure, please call and report this information to us.    If any biopsies were taken you will be contacted by phone or by letter within the next 1-3 weeks.  Please call us at [phone] if you have not heard about the biopsies in 3 weeks.    SIGNATURES/CONFIDENTIALITY: You and/or your care partner have signed paperwork which will be entered into your electronic medical record.  These signatures attest to the fact that that the information above on your After Visit Summary has been reviewed and is understood.  Full responsibility of the confidentiality of this discharge information lies with you and/or your care-partner.  

## 2021-02-20 NOTE — Progress Notes (Signed)
Called to room to assist during endoscopic procedure.  Patient ID and intended procedure confirmed with present staff. Received instructions for my participation in the procedure from the performing physician.  

## 2021-02-20 NOTE — Progress Notes (Signed)
History & Physical  Primary Care Physician:  Ailene Ards, NP Primary Gastroenterologist: Lucio Edward, MD  CHIEF COMPLAINT:  CRC screening, Personal history of colon polyps   HPI: Brandi Stevens is a 65 y.o. female personal history of 4 tubular adenomas on last colonoscopy in 2019 for surveillance colonoscopy.    Past Medical History:  Diagnosis Date   Arthritis    Bilateral shoulders.   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   R breast   Graves disease 1980s   RAI in 2008, not on any thyroid replacement hormone   Osteoarthritis of shoulder    Bilateral arthritis/bursitis   Rotator cuff tear arthropathy    R cuff   Seasonal allergies    SVD (spontaneous vaginal delivery)    x 3   Tubular adenoma of colon 01/2015    Past Surgical History:  Procedure Laterality Date   ABDOMINAL HYSTERECTOMY  1989   BREAST BIOPSY  12/22/2011   Procedure: BREAST BIOPSY;  Surgeon: Ralene Ok, MD;  Location: [facility];  Service: General;  Laterality: Right;  Excision right breast mass   BREAST EXCISIONAL BIOPSY     CHOLECYSTECTOMY  1989   COLONOSCOPY  01/2015   hx polyps-Diezel Mazur   COLONOSCOPY  2019   MS-MAC-prep good (goly)-TA- repeat 3 yrs   EYE SURGERY Left    MULTIPLE TOOTH EXTRACTIONS     POLYPECTOMY  2019   TA   ROTATOR CUFF REPAIR     R shoulder x2 Dr. Kayleen Memos at [facility] Right    SHOULDER ARTHROSCOPY W/ SUPERIOR LABRAL ANTERIOR POSTERIOR LESION REPAIR Right    bursitis   TUBAL LIGATION      Prior to Admission medications   Medication Sig Start Date End Date Taking? Authorizing Provider  cholecalciferol (VITAMIN D) 1000 UNITS tablet Take 2,000 Units by mouth daily.    Yes [provider]  gabapentin (NEURONTIN) 100 MG capsule Take 1 capsule (100 mg total) by mouth at bedtime. 03/27/19  Yes Edrick Kins,  DPM  linaclotide (LINZESS) 145 MCG CAPS capsule Take 1 capsule (145 mcg total) by mouth daily before breakfast. Patient taking differently: Take 145 mcg by mouth daily as needed. 03/30/19  Yes Willia Craze, NP  polyethylene glycol powder (GLYCOLAX/MIRALAX) 17 GM/SCOOP powder Take 17 g by mouth 2 (two) times daily as needed. 01/20/19  Yes Jacelyn Pi, Lilia Argue, MD  acetaminophen (TYLENOL) 650 MG CR tablet Take 650 mg by mouth every 8 (eight) hours as needed for pain.    [provider]  betamethasone dipropionate 0.05 % cream Apply topically 2 (two) times daily. Patient taking differently: Apply 1 application topically 2 (two) times daily as needed. 08/09/19   Jacelyn Pi, Lilia Argue, MD  cetirizine (ZYRTEC) 10 MG chewable tablet Chew 10 mg by mouth daily as needed.    [provider]  diclofenac sodium (VOLTAREN) 1 % GEL Apply 2 g topically daily as needed. For shoulder pain per patient 09/26/16   Posey Boyer, MD  fluticasone [facility]) 50 MCG/ACT nasal spray Place 1 spray into both nostrils daily. 03/06/19   Daleen Squibb, MD  Multiple Vitamins-Minerals (CENTRUM SILVER PO) Take 1 tablet by mouth daily.    [provider]  olopatadine (PATANOL) 0.1 % ophthalmic solution Place 1 drop into  both eyes 2 (two) times daily. Patient taking differently: Place 1 drop into both eyes 2 (two) times daily. OTC 03/06/19   Jacelyn Pi, Lilia Argue, MD    Current Outpatient Medications  Medication Sig Dispense Refill   cholecalciferol (VITAMIN D) 1000 UNITS tablet Take 2,000 Units by mouth daily.      gabapentin (NEURONTIN) 100 MG capsule Take 1 capsule (100 mg total) by mouth at bedtime. 30 capsule 2   linaclotide (LINZESS) 145 MCG CAPS capsule Take 1 capsule (145 mcg total) by mouth daily before breakfast. (Patient taking differently: Take 145 mcg by mouth daily as needed.) 30 capsule 3   polyethylene glycol powder (GLYCOLAX/MIRALAX) 17 GM/SCOOP powder Take 17 g by mouth 2  (two) times daily as needed. 225 g 5   acetaminophen (TYLENOL) 650 MG CR tablet Take 650 mg by mouth every 8 (eight) hours as needed for pain.     betamethasone dipropionate 0.05 % cream Apply topically 2 (two) times daily. (Patient taking differently: Apply 1 application topically 2 (two) times daily as needed.) 30 g 0   cetirizine (ZYRTEC) 10 MG chewable tablet Chew 10 mg by mouth daily as needed.     diclofenac sodium (VOLTAREN) 1 % GEL Apply 2 g topically daily as needed. For shoulder pain per patient 6 Tube 1   fluticasone (FLONASE) 50 MCG/ACT nasal spray Place 1 spray into both nostrils daily. 16 g 5   Multiple Vitamins-Minerals (CENTRUM SILVER PO) Take 1 tablet by mouth daily.     olopatadine (PATANOL) 0.1 % ophthalmic solution Place 1 drop into both eyes 2 (two) times daily. (Patient taking differently: Place 1 drop into both eyes 2 (two) times daily. OTC) 5 mL 5   Current Facility-Administered Medications  Medication Dose Route Frequency Provider Last Rate Last Admin   0.9 %  sodium chloride infusion  500 mL Intravenous Once Ladene Artist, MD        Allergies as of 02/20/2021 - Review Complete 02/20/2021  Allergen Reaction Noted   Boniva [ibandronic acid] Diarrhea and Nausea And Vomiting 11/30/2019   Latex Anaphylaxis and Rash 08/31/2012   Sulfa antibiotics Shortness Of Breath, Itching, and Anaphylaxis 12/14/2011    Family History  Problem Relation Age of Onset   Colon polyps Mother 42   Pancreatic cancer Mother 62       pancreatic   Hearing loss Father    Stroke Father 79       ischemic stroke   Prostate cancer Brother        with mets   Gastric cancer Maternal Aunt        gastric   Colon polyps Maternal Grandfather 53   Colon cancer Maternal Grandfather 29   Rectal cancer Neg Hx    Stomach cancer Neg Hx    Esophageal cancer Neg Hx     Social History   Socioeconomic History   Marital status: Divorced    Spouse name: Not on file   Number of children: 3    Years of education: Not on file   Highest education level: Not on file  Occupational History   Occupation: CNA    Comment: [employer] in home [employer]  Tobacco Use   Smoking status: Former    Packs/day: 1.00    Years: 20.00    Pack years: 20.00    Types: Cigarettes    Start date: 11/08/2011   Smokeless tobacco: Never  Vaping Use   Vaping Use: Never used  Substance and Sexual Activity  Alcohol use: No    Alcohol/week: 0.0 standard drinks   Drug use: No   Sexual activity: Not Currently    Birth control/protection: Post-menopausal  Other Topics Concern   Not on file  Social History Narrative   Live in [place], lives alone. Sons nearby.    Social Determinants of Health   Financial Resource Strain: Not on file  Food Insecurity: Not on file  Transportation Needs: Not on file  Physical Activity: Not on file  Stress: Not on file  Social Connections: Not on file  Intimate Partner Violence: Not on file    Review of Systems:  All systems reviewed an negative except where noted in HPI.  Gen: Denies any fever, chills, sweats, anorexia, fatigue, weakness, malaise, weight loss, and sleep disorder CV: Denies chest pain, angina, palpitations, syncope, orthopnea, PND, peripheral edema, and claudication. Resp: Denies dyspnea at rest, dyspnea with exercise, cough, sputum, wheezing, coughing up blood, and pleurisy. GI: Denies vomiting blood, jaundice, and fecal incontinence.   Denies dysphagia or odynophagia. GU : Denies urinary burning, blood in urine, urinary frequency, urinary hesitancy, nocturnal urination, and urinary incontinence. MS: Denies joint pain, limitation of movement, and swelling, stiffness, low back pain, extremity pain. Denies muscle weakness, cramps, atrophy.  Derm: Denies rash, itching, dry skin, hives, moles, warts, or unhealing ulcers.  Psych: Denies depression, anxiety, memory loss, suicidal ideation, hallucinations, paranoia, and confusion. Heme: Denies bruising,  bleeding, and enlarged lymph nodes. Neuro:  Denies any headaches, dizziness, paresthesias. Endo:  Denies any problems with DM, thyroid, adrenal function.   Physical Exam: General:  Alert, well-developed, in NAD Head:  Normocephalic and atraumatic. Eyes:  Sclera clear, no icterus.   Conjunctiva pink. Ears:  Normal auditory acuity. Mouth:  No deformity or lesions.  Neck:  Supple; no masses . Lungs:  Clear throughout to auscultation.   No wheezes, crackles, or rhonchi. No acute distress. Heart:  Regular rate and rhythm; no murmurs. Abdomen:  Soft, nondistended, nontender. No masses, hepatomegaly. No obvious masses.  Normal bowel .    Rectal:  Deferred   Msk:  Symmetrical without gross deformities.. Pulses:  Normal pulses noted. Extremities:  Without edema. Neurologic:  Alert and  oriented x4;  grossly normal neurologically. Skin:  Intact without significant lesions or rashes. Cervical Nodes:  No significant cervical adenopathy. Psych:  Alert and cooperative. Normal mood and affect.   Impression / Plan:   Personal history of 4 tubular adenomas on last colonoscopy in 2019 for surveillance colonoscopy.    Pricilla Riffle. Fuller Plan  02/20/2021, 9:29 AM See Shea Evans, [facility], to contact our on call provider

## 2021-02-20 NOTE — Progress Notes (Signed)
To pacu, VSS. Report to [email or web address] 

## 2021-02-20 NOTE — Op Note (Signed)
[facility] Patient Name: Brandi Stevens Procedure Date: 02/20/2021 9:21 AM MRN: [id number] Endoscopist: Ladene Artist , MD Age: 65 Referring MD:  Date of Birth: 05-01-1955 Gender: Female Account #: [id number] Procedure:                Colonoscopy Indications:              Surveillance: Personal history of adenomatous                            polyps on last colonoscopy 3 years ago Medicines:                Monitored Anesthesia Care Procedure:                Pre-Anesthesia Assessment:                           - Prior to the procedure, a History and Physical                            was performed, and patient medications and                            allergies were reviewed. The patient's tolerance of                            previous anesthesia was also reviewed. The risks                            and benefits of the procedure and the sedation                            options and risks were discussed with the patient.                            All questions were answered, and informed consent                            was obtained. Prior Anticoagulants: The patient has                            taken no previous anticoagulant or antiplatelet                            agents. ASA Grade Assessment: II - A patient with                            mild systemic disease. After reviewing the risks                            and benefits, the patient was deemed in                            satisfactory condition to undergo the procedure.  After obtaining informed consent, the colonoscope                            was passed under direct vision. Throughout the                            procedure, the patient's blood pressure, pulse, and                            oxygen saturations were monitored continuously. The                            CF [id number] #[id number] was introduced through the anus                            and advanced to the  the cecum, identified by                            appendiceal orifice and ileocecal valve. The                            ileocecal valve, appendiceal orifice, and rectum                            were photographed. The quality of the bowel                            preparation was adequate after extensive lavage,                            suction. The colonoscopy was performed without                            difficulty. The patient tolerated the procedure                            well. Scope In: 9:33:48 AM Scope Out: 9:52:44 AM Scope Withdrawal Time: 0 hours 15 minutes 34 seconds  Total Procedure Duration: 0 hours 18 minutes 56 seconds  Findings:                 The perianal and digital rectal examinations were                            normal.                           Four sessile polyps were found in the transverse                            colon, ascending colon (2) and cecum. The polyps                            were 7 to 8 mm in size. These polyps were removed  with a cold snare. Resection and retrieval were                            complete.                           The exam was otherwise without abnormality on                            direct and retroflexion views. Complications:            No immediate complications. Estimated blood loss:                            None. Estimated Blood Loss:     Estimated blood loss: none. Impression:               - Four 7 to 8 mm polyps in the transverse colon, in                            the ascending colon and in the cecum, removed with                            a cold snare. Resected and retrieved.                           - The examination was otherwise normal on direct                            and retroflexion views. Recommendation:           - Repeat colonoscopy, likely 3 years, after studies                            are complete for surveillance based on pathology                             results with a more extensive bowel prep.                           - Patient has a contact number available for                            emergencies. The signs and symptoms of potential                            delayed complications were discussed with the                            patient. Return to normal activities tomorrow.                            Written discharge instructions were provided to the  patient.                           - Resume previous diet.                           - Continue present medications.                           - Await pathology results. Ladene Artist, MD 02/20/2021 9:57:33 AM This report has been signed electronically.

## 2021-02-24 ENCOUNTER — Telehealth: Payer: Self-pay

## 2021-02-24 NOTE — Telephone Encounter (Signed)
°  Follow up Call-  Call back number 02/20/2021  Post procedure Call Back phone  # [phone]  Permission to leave phone message Yes  Some recent data might be hidden     Patient questions:  Do you have a fever, pain , or abdominal swelling? No. Pain Score  0 *  Have you tolerated food without any problems? Yes.    Have you been able to return to your normal activities? Yes.    Do you have any questions about your discharge instructions: Diet   No. Medications  No. Follow up visit  No.  Do you have questions or concerns about your Care? No.  Actions: * If pain score is 4 or above: No action needed, pain <4.

## 2021-03-17 ENCOUNTER — Encounter: Payer: Self-pay | Admitting: Gastroenterology

## 2021-03-18 ENCOUNTER — Telehealth: Payer: Self-pay | Admitting: Gastroenterology

## 2021-03-18 NOTE — Telephone Encounter (Signed)
Returned patient's call and gave her Colonoscopy path results. Let her know a letter has been sent out.

## 2021-03-18 NOTE — Telephone Encounter (Signed)
Inbound call from patient requesting colonoscopy results please.

## 2021-04-10 ENCOUNTER — Other Ambulatory Visit: Payer: Self-pay

## 2021-04-10 ENCOUNTER — Encounter: Payer: Self-pay | Admitting: Nurse Practitioner

## 2021-04-10 ENCOUNTER — Ambulatory Visit (INDEPENDENT_AMBULATORY_CARE_PROVIDER_SITE_OTHER): Payer: 59 | Admitting: Nurse Practitioner

## 2021-04-10 VITALS — BP 100/63 | HR 54 | Temp 98.2°F | Ht 65.0 in | Wt 136.2 lb

## 2021-04-10 DIAGNOSIS — Z8639 Personal history of other endocrine, nutritional and metabolic disease: Secondary | ICD-10-CM

## 2021-04-10 DIAGNOSIS — Z800 Family history of malignant neoplasm of digestive organs: Secondary | ICD-10-CM

## 2021-04-10 DIAGNOSIS — R591 Generalized enlarged lymph nodes: Secondary | ICD-10-CM

## 2021-04-10 DIAGNOSIS — M810 Age-related osteoporosis without current pathological fracture: Secondary | ICD-10-CM

## 2021-04-10 DIAGNOSIS — Z131 Encounter for screening for diabetes mellitus: Secondary | ICD-10-CM | POA: Diagnosis not present

## 2021-04-10 DIAGNOSIS — L723 Sebaceous cyst: Secondary | ICD-10-CM

## 2021-04-10 DIAGNOSIS — Z13220 Encounter for screening for lipoid disorders: Secondary | ICD-10-CM

## 2021-04-10 LAB — HEMOGLOBIN A1C: Hgb A1c MFr Bld: 6 % (ref 4.6–6.5)

## 2021-04-10 LAB — CBC WITH DIFFERENTIAL/PLATELET
Basophils Absolute: 0 10*3/uL (ref 0.0–0.1)
Basophils Relative: 0.9 % (ref 0.0–3.0)
Eosinophils Absolute: 0 10*3/uL (ref 0.0–0.7)
Eosinophils Relative: 1.4 % (ref 0.0–5.0)
HCT: 39.8 % (ref 36.0–46.0)
Hemoglobin: 13.2 g/dL (ref 12.0–15.0)
Lymphocytes Relative: 44.6 % (ref 12.0–46.0)
Lymphs Abs: 1.6 10*3/uL (ref 0.7–4.0)
MCHC: 33.2 g/dL (ref 30.0–36.0)
MCV: 90 fl (ref 78.0–100.0)
Monocytes Absolute: 0.4 10*3/uL (ref 0.1–1.0)
Monocytes Relative: 10.3 % (ref 3.0–12.0)
Neutro Abs: 1.5 10*3/uL (ref 1.4–7.7)
Neutrophils Relative %: 42.8 % — ABNORMAL LOW (ref 43.0–77.0)
Platelets: 281 10*3/uL (ref 150.0–400.0)
RBC: 4.42 Mil/uL (ref 3.87–5.11)
RDW: 13.2 % (ref 11.5–15.5)
WBC: 3.5 10*3/uL — ABNORMAL LOW (ref 4.0–10.5)

## 2021-04-10 LAB — VITAMIN D 25 HYDROXY (VIT D DEFICIENCY, FRACTURES): VITD: 19.35 ng/mL — ABNORMAL LOW (ref 30.00–100.00)

## 2021-04-10 LAB — COMPREHENSIVE METABOLIC PANEL
ALT: 11 U/L (ref 0–35)
AST: 13 U/L (ref 0–37)
Albumin: 4.4 g/dL (ref 3.5–5.2)
Alkaline Phosphatase: 74 U/L (ref 39–117)
BUN: 6 mg/dL (ref 6–23)
CO2: 28 mEq/L (ref 19–32)
Calcium: 9.7 mg/dL (ref 8.4–10.5)
Chloride: 108 mEq/L (ref 96–112)
Creatinine, Ser: 0.81 mg/dL (ref 0.40–1.20)
GFR: 76.62 mL/min (ref >60.00)
Glucose, Bld: 96 mg/dL (ref 70–99)
Potassium: 4.1 mEq/L (ref 3.5–5.1)
Sodium: 142 mEq/L (ref 135–145)
Total Bilirubin: 0.3 mg/dL (ref 0.2–1.2)
Total Protein: 7.2 g/dL (ref 6.0–8.3)

## 2021-04-10 LAB — LIPID PANEL
Cholesterol: 186 mg/dL (ref 0–200)
HDL: 61.4 mg/dL (ref >39.00)
LDL Cholesterol: 109 mg/dL — ABNORMAL HIGH (ref 0–99)
NonHDL: 125
Total CHOL/HDL Ratio: 3
Triglycerides: 79 mg/dL (ref 0.0–149.0)
VLDL: 15.8 mg/dL (ref 0.0–40.0)

## 2021-04-10 LAB — T4, FREE: Free T4: 0.65 ng/dL (ref 0.60–1.60)

## 2021-04-10 LAB — T3, FREE: T3, Free: 3.8 pg/mL (ref 2.3–4.2)

## 2021-04-10 LAB — TSH: TSH: 6.89 u[IU]/mL — ABNORMAL HIGH (ref 0.35–5.50)

## 2021-04-10 NOTE — Patient Instructions (Signed)
Call Dr. Irving Shows about Endoscopic Ultrasound or MRCP to screen for pancreatic cancer.

## 2021-04-10 NOTE — Progress Notes (Signed)
Subjective:  Patient ID: Brandi Stevens, female    DOB: 10/02/1955  Age: 66 y.o. MRN: [id number]  CC:  Chief Complaint  Patient presents with   Transitions Of Care      HPI  This patient arrives today for the above.  She is new patient to this practice.  Her main concerns are having her thyroid monitored because she has a history of Graves' disease she was treated for this in the past and does not require thyroid replacement at this time.  She also tells me her mom died from pancreatic cancer she also has multiple uncles were diagnosed with stomach cancer a brother with prostate cancer and aunt with pancreatic cancer.  She would like to discuss screening for this.  She also mention she has a mass to the left side of her lower abdomen that has been present for many years but is now growing and she would like this evaluated and removed if possible.  Past Medical History:  Diagnosis Date   Arthritis    Bilateral shoulders.   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   R breast   Graves disease 1980s   RAI in 2008, not on any thyroid replacement hormone   Osteoarthritis of shoulder    Bilateral arthritis/bursitis   Rotator cuff tear arthropathy    R cuff   Seasonal allergies    SVD (spontaneous vaginal delivery)    x 3   Tubular adenoma of colon 01/2015      Family History  Problem Relation Age of Onset   Colon polyps Mother 14   Pancreatic cancer Mother 71       pancreatic   Hearing loss Father    Stroke Father 1       ischemic stroke   Prostate cancer Brother        with mets   Gastric cancer Maternal Aunt        gastric   Colon polyps Maternal Grandfather 65   Colon cancer Maternal Grandfather 36   Rectal cancer Neg Hx    Stomach cancer Neg Hx    Esophageal cancer Neg Hx     Social History   Social History Narrative   Live  in [place], lives alone. Sons nearby.    Social History   Tobacco Use   Smoking status: Former    Packs/day: 1.00    Years: 20.00    Pack years: 20.00    Types: Cigarettes    Start date: 11/08/2011   Smokeless tobacco: Never  Substance Use Topics   Alcohol use: No    Alcohol/week: 0.0 standard drinks     Current Meds  Medication Sig   acetaminophen (TYLENOL) 650 MG CR tablet Take 650 mg by mouth every 8 (eight) hours as needed for pain.   cetirizine (ZYRTEC) 10 MG chewable tablet Chew 10 mg by mouth daily as needed.   cholecalciferol (VITAMIN D) 1000 UNITS tablet Take 5,000 Units by mouth 2 (two) times a week.   diclofenac sodium (VOLTAREN) 1 % GEL Apply 2 g topically daily as needed. For shoulder pain per patient   fluticasone (FLONASE) 50 MCG/ACT nasal spray Place 1 spray into both nostrils daily.   gabapentin (NEURONTIN) 100 MG capsule Take 1 capsule (100 mg total) by mouth at bedtime.   linaclotide (LINZESS) 145  MCG CAPS capsule Take 1 capsule (145 mcg total) by mouth daily before breakfast. (Patient taking differently: Take 145 mcg by mouth daily as needed.)   Multiple Vitamins-Minerals (CENTRUM SILVER PO) Take 1 tablet by mouth daily.   olopatadine (PATANOL) 0.1 % ophthalmic solution Place 1 drop into both eyes 2 (two) times daily. (Patient taking differently: Place 1 drop into both eyes 2 (two) times daily. OTC)   polyethylene glycol powder (GLYCOLAX/MIRALAX) 17 GM/SCOOP powder Take 17 g by mouth 2 (two) times daily as needed.    ROS:  Review of Systems  Constitutional:  Negative for diaphoresis, fever, malaise/fatigue and weight loss.  Respiratory:  Negative for shortness of breath.   Cardiovascular:  Negative for chest pain.  Gastrointestinal:  Negative for abdominal pain and constipation.  Skin:        (+) mass - left side of abdomen (lower)    Objective:   Today's Vitals: BP 100/63 (BP Location: Left Arm, Patient Position: Sitting, Cuff Size: Normal)    Pulse  (!) 54    Temp 98.2 F (36.8 C) (Oral)    Ht 5\' 5"  (1.651 m)    Wt 136 lb 3.2 oz (61.8 kg)    SpO2 99%    BMI 22.66 kg/m  Vitals with BMI 04/10/2021 02/20/2021 02/20/2021  Height 5\' 5"  - -  Weight 136 lbs 3 oz - -  BMI 84.53 - -  Systolic 646 803 96  Diastolic 63 65 68  Pulse 54 50 51     Physical Exam Vitals reviewed.  Constitutional:      General: She is not in acute distress.    Appearance: Normal appearance.  HENT:     Head: Normocephalic and atraumatic.  Neck:     Vascular: No carotid bruit.   Cardiovascular:     Rate and Rhythm: Normal rate and regular rhythm.     Pulses: Normal pulses.     Heart sounds: Normal heart sounds.  Pulmonary:     Effort: Pulmonary effort is normal.     Breath sounds: Normal breath sounds.  Abdominal:    Lymphadenopathy:     Cervical: Cervical adenopathy present.  Skin:    General: Skin is warm and dry.  Neurological:     General: No focal deficit present.     Mental Status: She is alert and oriented to person, place, and time.  Psychiatric:        Mood and Affect: Mood normal.        Behavior: Behavior normal.        Judgment: Judgment normal.         Assessment and Plan   1. History of Graves' disease   2. Sebaceous cyst   3. Family history of pancreatic cancer   4. Diabetes mellitus screening   5. Lipid screening   6. Osteoporosis without current pathological fracture, unspecified osteoporosis type   7. Lymphadenopathy      Plan: 1.  We will check thyroid panel today. 2.  Mass to her left lower abdomen appears consistent with sebaceous cyst but she is very concerned about this on like this removed.  I offered ultrasound prior to sending to surgery, but she wants to be seen by surgery as opposed to doing imaging at this time.  Referral to general surgery made today. 3.  She seems to have a very significant family history of abdominal cancers.  She is not aware if she is ever been evaluated for Lynch syndrome.  She may  be a candidate for pancreatic screening via endoscopic ultrasound or MRCP.  She does have a gastroenterologist Dr. Irving Shows.  I encouraged her to call his office and ask him about having either of those imaging studies done for pancreatic cancer screening.  She denies any current abdominal pain or unintentional weight loss at this time. 4.-6.  We will order blood work for further evaluation today. 7.  She did have an enlarged lymph node on exam, per chart review I do see she has had CT scan of lymph node which did show 11 mm lymph node in the neck.  We will do ultrasound for follow-up of this.   Tests ordered Orders Placed This Encounter  Procedures   US Soft Tissue Head/Neck (NON-THYROID)   TSH   Hemoglobin A1c   Lipid panel   Comprehensive metabolic panel   CBC with Differential/Platelet   T3, free   T4, free   Vitamin D (25 hydroxy)   Ambulatory referral to General Surgery      No orders of the defined types were placed in this encounter.   Patient to follow-up in 1 month or sooner as needed.  Ailene Ards, NP

## 2021-04-15 ENCOUNTER — Telehealth: Payer: Self-pay | Admitting: Gastroenterology

## 2021-04-15 NOTE — Telephone Encounter (Signed)
Inbound call from patient stating she would like to speak with the nurse about a Endoscopic  ultra sound or a  MR Ct to screen for pancreatic  cancer. Seeking advice, please advise.

## 2021-04-16 ENCOUNTER — Other Ambulatory Visit: Payer: Self-pay

## 2021-04-16 DIAGNOSIS — Z800 Family history of malignant neoplasm of digestive organs: Secondary | ICD-10-CM

## 2021-04-16 NOTE — Telephone Encounter (Signed)
I reviewed her 04/10/2021 PCP note. She is being referred to general surgery for a small abdominal wall lesion. To assess her pancreatic cancer risk proceed with a Genetics Counseling referral [facility]) first and then an office visit with me or an APP.

## 2021-04-16 NOTE — Telephone Encounter (Signed)
Patient called in to discuss having a pancreatic cancer screening. She was last seen for an OV in 2021, and a colon with you in 2022. Do you want her to schedule an OV to discuss this with you?

## 2021-04-16 NOTE — Telephone Encounter (Signed)
Spoke with patient's family member regarding Dr. Lynne Leader recommendations. Referral has been sent to [facility], and family is aware that once that has been done they can call to set up an OV here with Dr. Fuller Plan. No further questions.

## 2021-04-17 ENCOUNTER — Telehealth: Payer: Self-pay | Admitting: Hematology and Oncology

## 2021-04-17 NOTE — Telephone Encounter (Signed)
Scheduled appts for new pt and genetics per 2/9 secure chat with nurse navigator, [place]. Called pt a total of five times, no answer each time and no vm was available. The phone line kept ringing. I tried to call the work number listed and was told there was no one by the pt's name that worked there. I also called the pt's spouse, it went straight to vm when I called. I did leave a msg for them to call me back about the appts.

## 2021-04-18 ENCOUNTER — Ambulatory Visit (INDEPENDENT_AMBULATORY_CARE_PROVIDER_SITE_OTHER): Payer: 59 | Admitting: Internal Medicine

## 2021-04-18 ENCOUNTER — Telehealth: Payer: Self-pay | Admitting: Genetic Counselor

## 2021-04-18 ENCOUNTER — Other Ambulatory Visit: Payer: Self-pay

## 2021-04-18 VITALS — BP 126/66 | HR 59 | Temp 98.2°F | Wt 136.5 lb

## 2021-04-18 DIAGNOSIS — E559 Vitamin D deficiency, unspecified: Secondary | ICD-10-CM

## 2021-04-18 DIAGNOSIS — R1032 Left lower quadrant pain: Secondary | ICD-10-CM | POA: Diagnosis not present

## 2021-04-18 DIAGNOSIS — J438 Other emphysema: Secondary | ICD-10-CM

## 2021-04-18 DIAGNOSIS — R350 Frequency of micturition: Secondary | ICD-10-CM

## 2021-04-18 LAB — URINALYSIS, ROUTINE W REFLEX MICROSCOPIC
Bilirubin Urine: NEGATIVE
Hgb urine dipstick: NEGATIVE
Ketones, ur: NEGATIVE
Nitrite: NEGATIVE
RBC / HPF: NONE SEEN (ref 0–?)
Specific Gravity, Urine: <=1.005 — AB (ref 1.000–1.030)
Total Protein, Urine: NEGATIVE
Urine Glucose: NEGATIVE
Urobilinogen, UA: 0.2 (ref 0.0–1.0)
pH: 5 (ref 5.0–8.0)

## 2021-04-18 NOTE — Patient Instructions (Signed)
Please continue all other medications as before, and refills have been done if requested.  Please have the pharmacy call with any other refills you may need.  Please continue your efforts at being more active, low cholesterol diet, and weight control.  Please keep your appointments with your specialists as you may have planned  Your specimen will be sent to the pharmacy  You will be contacted regarding the referral for: CT scan

## 2021-04-18 NOTE — Progress Notes (Signed)
Patient ID: Brandi Stevens, female   DOB: [birth year], 66 y.o.   MRN: [id number]        Chief Complaint: follow up urinary frequency       HPI:  Brandi Stevens is a 66 y.o. female here with c/o increased urinary frequency for 3 days but Denies urinary symptoms such as dysuria, frequency, urgency, flank pain, hematuria or n/v, fever, chills, but does also have left sided abd pain mod intermittent without radiation, nothing makes better or worse.  Sister with recent recent stone.  This has happended previously and resolved without diagnosis in the last 2 yrs.  No worsening LBP and Denies worsening reflux, abd pain, dysphagia, n/v, bowel change or blood.  Pt denies chest pain, increased sob or doe, wheezing, orthopnea, PND, increased LE swelling, palpitations, dizziness or syncope.         Wt Readings from Last 3 Encounters:  04/18/21 136 lb 8 oz (61.9 kg)  04/10/21 136 lb 3.2 oz (61.8 kg)  02/20/21 133 lb (60.3 kg)   BP Readings from Last 3 Encounters:  04/18/21 126/66  04/10/21 100/63  02/20/21 117/65         Past Medical History:  Diagnosis Date   Arthritis    Bilateral shoulders.   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   R breast   Graves disease 1980s   RAI in 2008, not on any thyroid replacement hormone   Osteoarthritis of shoulder    Bilateral arthritis/bursitis   Rotator cuff tear arthropathy    R cuff   Seasonal allergies    SVD (spontaneous vaginal delivery)    x 3   Tubular adenoma of colon 01/2015   Past Surgical History:  Procedure Laterality Date   ABDOMINAL HYSTERECTOMY  1989   BREAST BIOPSY  12/22/2011   Procedure: BREAST BIOPSY;  Surgeon: Ralene Ok, MD;  Location: [facility];  Service: General;  Laterality: Right;  Excision right breast mass   BREAST EXCISIONAL BIOPSY     CHOLECYSTECTOMY  1989   COLONOSCOPY  01/2015   hx  polyps-[facility]   COLONOSCOPY  2019   MS-MAC-prep good (goly)-TA- repeat 3 yrs   EYE SURGERY Left    MULTIPLE TOOTH EXTRACTIONS     POLYPECTOMY  2019   TA   ROTATOR CUFF REPAIR     R shoulder x2 Dr. Kayleen Memos at [facility] Right    SHOULDER ARTHROSCOPY W/ SUPERIOR LABRAL ANTERIOR POSTERIOR LESION REPAIR Right    bursitis   TUBAL LIGATION      reports that she has quit smoking. Her smoking use included cigarettes. She started smoking about 9 years ago. She has a 20.00 pack-year smoking history. She has never used smokeless tobacco. She reports that she does not drink alcohol and does not use drugs. family history includes Colon cancer (age of onset: 66) in her maternal grandfather; Colon polyps (age of onset: 17) in her mother; Colon polyps (age of onset: 32) in her maternal grandfather; Gastric cancer in her maternal aunt; Hearing loss in her father; Pancreatic cancer (age of onset: 57) in her mother; Prostate cancer in her brother; Stroke (age of onset: 89) in her father. Allergies  Allergen Reactions   Boniva [Ibandronic Acid] Diarrhea and Nausea And Vomiting   Latex Anaphylaxis and Rash    Latex  gloves/ skin itch    Sulfa Antibiotics Shortness Of Breath, Itching and Anaphylaxis    Also, wheezing   Current Outpatient Medications on File Prior to Visit  Medication Sig Dispense Refill   acetaminophen (TYLENOL) 650 MG CR tablet Take 650 mg by mouth every 8 (eight) hours as needed for pain.     cetirizine (ZYRTEC) 10 MG chewable tablet Chew 10 mg by mouth daily as needed.     cholecalciferol (VITAMIN D) 1000 UNITS tablet Take 5,000 Units by mouth 2 (two) times a week.     diclofenac sodium (VOLTAREN) 1 % GEL Apply 2 g topically daily as needed. For shoulder pain per patient 6 Tube 1   fluticasone (FLONASE) 50 MCG/ACT nasal spray Place 1 spray into both nostrils daily. 16 g 5   gabapentin (NEURONTIN) 100 MG capsule Take 1 capsule (100 mg total) by mouth at bedtime.  30 capsule 2   linaclotide (LINZESS) 145 MCG CAPS capsule Take 1 capsule (145 mcg total) by mouth daily before breakfast. (Patient taking differently: Take 145 mcg by mouth daily as needed.) 30 capsule 3   Multiple Vitamins-Minerals (CENTRUM SILVER PO) Take 1 tablet by mouth daily.     olopatadine (PATANOL) 0.1 % ophthalmic solution Place 1 drop into both eyes 2 (two) times daily. (Patient taking differently: Place 1 drop into both eyes 2 (two) times daily. OTC) 5 mL 5   polyethylene glycol powder (GLYCOLAX/MIRALAX) 17 GM/SCOOP powder Take 17 g by mouth 2 (two) times daily as needed. 225 g 5   No current facility-administered medications on file prior to visit.        ROS:  All others reviewed and negative.  Objective        PE:  BP 126/66    Pulse (!) 59    Temp 98.2 F (36.8 C) (Oral)    Wt 136 lb 8 oz (61.9 kg)    SpO2 93%    BMI 22.71 kg/m                 Constitutional: Pt appears in NAD               HENT: Head: NCAT.                Right Ear: External ear normal.                 Left Ear: External ear normal.                Eyes: . Pupils are equal, round, and reactive to light. Conjunctivae and EOM are normal               Nose: without d/c or deformity               Neck: Neck supple. Gross normal ROM               Cardiovascular: Normal rate and regular rhythm.                 Pulmonary/Chest: Effort normal and breath sounds without rales or wheezing.                Abd:  Soft, low mid abd tender, ND, + BS, no organomegaly               Neurological: Pt is alert. At baseline orientation, motor grossly intact               Skin: Skin is warm.  No rashes, no other new lesions, LE edema - none               Psychiatric: Pt behavior is normal without agitation   Micro: none  Cardiac tracings I have personally interpreted today:  none  Pertinent Radiological findings (summarize): none   Lab Results  Component Value Date   WBC 3.5 (L) 04/10/2021   HGB 13.2 04/10/2021   HCT  39.8 04/10/2021   PLT 281.0 04/10/2021   GLUCOSE 96 04/10/2021   CHOL 186 04/10/2021   TRIG 79.0 04/10/2021   HDL 61.40 04/10/2021   LDLCALC 109 (H) 04/10/2021   ALT 11 04/10/2021   AST 13 04/10/2021   NA 142 04/10/2021   K 4.1 04/10/2021   CL 108 04/10/2021   CREATININE 0.81 04/10/2021   BUN 6 04/10/2021   CO2 28 04/10/2021   TSH 6.89 (H) 04/10/2021   HGBA1C 6.0 04/10/2021   Assessment/Plan:  Brandi Stevens is a 66 y.o. Black or African American [2] female with  has a past medical history of Arthritis, Bradycardia, Diverticulitis (2012), Emphysema, Fibroadenoma of breast (01/2012), Graves disease (1980s), Osteoarthritis of shoulder, Rotator cuff tear arthropathy, Seasonal allergies, SVD (spontaneous vaginal delivery), and Tubular adenoma of colon (01/2015).  Abdominal pain, LLQ Pt afeb, non toxic and exam benign except for low mid abd tender, etiology unclear, for UA and Cx, and CT abd renal protocol,  to f/u any worsening symptoms or concerns   Other emphysema (HCC) O/w stable, cont current med tx, declines need for inhaler prn for now  Vitamin D deficiency Last vitamin D Lab Results  Component Value Date   VD25OH 19.35 (L) 04/10/2021   Low, reminded to start oral replacement  Followup: Return if symptoms worsen or fail to improve.  Cathlean Cower, MD 04/20/2021 6:40 AM [facility]

## 2021-04-18 NOTE — Telephone Encounter (Signed)
Returned patient Brandi Stevens.  Discussed with patient the purpose of genetic counseling and testing.  Discussed billing procedures for genetic testing laboratory.  Patient complained of pain on side--encouraged her to speak with PCP about symptoms.

## 2021-04-19 LAB — URINE CULTURE: Result:: NO GROWTH

## 2021-04-20 ENCOUNTER — Encounter: Payer: Self-pay | Admitting: Internal Medicine

## 2021-04-20 NOTE — Assessment & Plan Note (Signed)
O/w stable, cont current med tx, declines need for inhaler prn for now

## 2021-04-20 NOTE — Assessment & Plan Note (Signed)
Pt afeb, non toxic and exam benign except for low mid abd tender, etiology unclear, for UA and Cx, and CT abd renal protocol,  to f/u any worsening symptoms or concerns

## 2021-04-20 NOTE — Assessment & Plan Note (Signed)
Last vitamin D Lab Results  Component Value Date   VD25OH 19.35 (L) 04/10/2021   Low, reminded to start oral replacement

## 2021-04-21 ENCOUNTER — Telehealth: Payer: Self-pay | Admitting: Nurse Practitioner

## 2021-04-21 NOTE — Telephone Encounter (Signed)
Connected to [facility] 2.10.2023.  Caller states she has been having left side pain when she moves. She wants to make an appointment to get it checked for possible kidney infection. She is having right sided pain and back, burning, color of the urine is darker and no fever. She thinks it might be a UTI. She is drinking plenty if fluids.  Advised to see HCP within 4 hours.

## 2021-04-24 ENCOUNTER — Other Ambulatory Visit: Payer: Self-pay

## 2021-04-24 ENCOUNTER — Ambulatory Visit
Admission: RE | Admit: 2021-04-24 | Discharge: 2021-04-24 | Disposition: A | Discharge status: Home / Self Care | Payer: 59 | Source: Ambulatory Visit | Attending: Nurse Practitioner | Admitting: Nurse Practitioner

## 2021-04-24 DIAGNOSIS — R591 Generalized enlarged lymph nodes: Secondary | ICD-10-CM

## 2021-05-16 ENCOUNTER — Ambulatory Visit (INDEPENDENT_AMBULATORY_CARE_PROVIDER_SITE_OTHER): Payer: 59 | Admitting: Nurse Practitioner

## 2021-05-16 ENCOUNTER — Other Ambulatory Visit: Payer: Self-pay

## 2021-05-16 ENCOUNTER — Encounter: Payer: Self-pay | Admitting: Nurse Practitioner

## 2021-05-16 VITALS — BP 118/78 | HR 65 | Temp 99.2°F | Ht 65.0 in | Wt 133.0 lb

## 2021-05-16 DIAGNOSIS — Z0001 Encounter for general adult medical examination with abnormal findings: Secondary | ICD-10-CM

## 2021-05-16 DIAGNOSIS — E559 Vitamin D deficiency, unspecified: Secondary | ICD-10-CM

## 2021-05-16 DIAGNOSIS — K5900 Constipation, unspecified: Secondary | ICD-10-CM

## 2021-05-16 DIAGNOSIS — D709 Neutropenia, unspecified: Secondary | ICD-10-CM | POA: Diagnosis not present

## 2021-05-16 DIAGNOSIS — R7303 Prediabetes: Secondary | ICD-10-CM

## 2021-05-16 DIAGNOSIS — H1013 Acute atopic conjunctivitis, bilateral: Secondary | ICD-10-CM

## 2021-05-16 DIAGNOSIS — E0500 Thyrotoxicosis with diffuse goiter without thyrotoxic crisis or storm: Secondary | ICD-10-CM

## 2021-05-16 DIAGNOSIS — Z1231 Encounter for screening mammogram for malignant neoplasm of breast: Secondary | ICD-10-CM | POA: Diagnosis not present

## 2021-05-16 HISTORY — DX: Neutropenia, unspecified: D70.9

## 2021-05-16 LAB — CBC WITH DIFFERENTIAL/PLATELET
Basophils Absolute: 0 10*3/uL (ref 0.0–0.1)
Basophils Relative: 0.6 % (ref 0.0–3.0)
Eosinophils Absolute: 0 10*3/uL (ref 0.0–0.7)
Eosinophils Relative: 0.8 % (ref 0.0–5.0)
HCT: 42.4 % (ref 36.0–46.0)
Hemoglobin: 14 g/dL (ref 12.0–15.0)
Lymphocytes Relative: 42.9 % (ref 12.0–46.0)
Lymphs Abs: 1.6 10*3/uL (ref 0.7–4.0)
MCHC: 33.1 g/dL (ref 30.0–36.0)
MCV: 91.2 fl (ref 78.0–100.0)
Monocytes Absolute: 0.4 10*3/uL (ref 0.1–1.0)
Monocytes Relative: 9.6 % (ref 3.0–12.0)
Neutro Abs: 1.8 10*3/uL (ref 1.4–7.7)
Neutrophils Relative %: 46.1 % (ref 43.0–77.0)
Platelets: 297 10*3/uL (ref 150.0–400.0)
RBC: 4.65 Mil/uL (ref 3.87–5.11)
RDW: 13.3 % (ref 11.5–15.5)
WBC: 3.8 10*3/uL — ABNORMAL LOW (ref 4.0–10.5)

## 2021-05-16 LAB — BASIC METABOLIC PANEL
BUN: 7 mg/dL (ref 6–23)
CO2: 29 mEq/L (ref 19–32)
Calcium: 9.9 mg/dL (ref 8.4–10.5)
Chloride: 105 mEq/L (ref 96–112)
Creatinine, Ser: 0.81 mg/dL (ref 0.40–1.20)
GFR: 76.56 mL/min (ref >60.00)
Glucose, Bld: 93 mg/dL (ref 70–99)
Potassium: 4.2 mEq/L (ref 3.5–5.1)
Sodium: 140 mEq/L (ref 135–145)

## 2021-05-16 LAB — VITAMIN D 25 HYDROXY (VIT D DEFICIENCY, FRACTURES): VITD: 28.66 ng/mL — ABNORMAL LOW (ref 30.00–100.00)

## 2021-05-16 MED ORDER — LINACLOTIDE 145 MCG PO CAPS
145.0000 ug | ORAL_CAPSULE | Freq: Every day | ORAL | 2 refills | Status: AC | PRN
Start: 2021-05-16 — End: ?

## 2021-05-16 MED ORDER — FLUTICASONE PROPIONATE 50 MCG/ACT NA SUSP: 1.0000 | Freq: Every day | NASAL | 5 refills | Status: AC

## 2021-05-16 MED ORDER — OLOPATADINE HCL 0.1 % OP SOLN: 1.0000 [drp] | Freq: Two times a day (BID) | OPHTHALMIC | 5 refills | Status: DC

## 2021-05-16 NOTE — Assessment & Plan Note (Signed)
Chronic, stable.  Recommend she continue using her olopatadine eyedrops.  Also recommend she use Flonase nasal spray if she experiences rhinitis and oral Zyrtec.  We did discuss if she experiences any irritation of her nose or bloody nasal discharge that she could stop using the nasal spray and just use oral Zyrtec as needed. ?

## 2021-05-16 NOTE — Assessment & Plan Note (Signed)
Chronic.  TSH elevated, but T3 and T4 still within normal limits.  T4 is borderline low, will monitor closely and recheck in approximately 6 months.  Patient was educated on signs and symptoms of hypothyroidism and notify me if symptoms occur prior to her next office visit.  She reports her understanding. ?

## 2021-05-16 NOTE — Assessment & Plan Note (Signed)
Neutropenia is very mild, however we will repeat CBC and have pathologist review smear for further evaluation.  Further recommendations may be made based upon these results. ?

## 2021-05-16 NOTE — Progress Notes (Signed)
Subjective:  Patient ID: Brandi Stevens, female    DOB: 10-09-1955  Age: 66 y.o. MRN: [id number]  CC:  Chief Complaint  Patient presents with   Annual Exam   Medication Refill    Refill on eye drops and eirther zyrtec or flonase       HPI  This patient arrives today for the above.  Dry eye/allergic rhinitis: She has chronic history of dry eyes and allergic rhinitis.  She would like refill on her olopatadine eyedrops as well as to discuss whether or not she should take Flonase nasal spray versus Zyrtec nasal spray versus Zyrtec by mouth.  Health maintenance: Due for mammogram, up-to-date on colon cancer screening, up-to-date on tetanus shot, has completed hepatitis C screening.  Vitamin D deficiency: Last office visit we did collect blood work which showed vitamin D level of 19.  She is on vitamin D3 5000 IUs by mouth.  She increased her dose from 5000 IUs twice a week to 3 times a week after last blood work results.  She be due to have this rechecked today.  Leukopenia: Mild leukopenia noted on last blood work as well.  No history of this in the past.  Patient denies any recent fevers or night sweats, she does report some episodes where she feels hot all over and then experiences these skin pricking sensation to her upper and lower extremities.  These episodes spontaneously resolve within a few seconds.  She is not experiencing significant fatigue or unintentional weight loss.  Prediabetes: Last A1c was 6.0.  She is here to discuss this as well today.  Graves' Disease: She has a history of Graves' disease.  Last thyroid panel showed TSH greater than 6 but T3 and T4 within normal limits.  She did undergo iodine treatment back in 2008.  Past Medical History:  Diagnosis Date   Arthritis    Bilateral shoulders.   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    R breast   Graves disease 1980s   RAI in 2008, not on any thyroid replacement hormone   Osteoarthritis of shoulder    Bilateral arthritis/bursitis   Rotator cuff tear arthropathy    R cuff   Seasonal allergies    SVD (spontaneous vaginal delivery)    x 3   Tubular adenoma of colon 01/2015      Family History  Problem Relation Age of Onset   Colon polyps Mother 14   Pancreatic cancer Mother 3       pancreatic   Hearing loss Father    Stroke Father 56       ischemic stroke   Prostate cancer Brother        with mets   Gastric cancer Maternal Aunt        gastric   Colon polyps Maternal Grandfather 65   Colon cancer Maternal Grandfather 27   Rectal cancer Neg Hx    Stomach cancer Neg Hx    Esophageal cancer Neg Hx     Social History   Social History Narrative   Live in [place], lives alone. Sons nearby.    Social History   Tobacco Use   Smoking status: Former    Packs/day: 1.00    Years: 20.00    Pack years: 20.00    Types: Cigarettes    Start  date: 11/08/2011   Smokeless tobacco: Never  Substance Use Topics   Alcohol use: No    Alcohol/week: 0.0 standard drinks     Current Meds  Medication Sig   acetaminophen (TYLENOL) 650 MG CR tablet Take 650 mg by mouth every 8 (eight) hours as needed for pain.   cetirizine (ZYRTEC) 10 MG chewable tablet Chew 10 mg by mouth daily as needed.   cholecalciferol (VITAMIN D) 1000 UNITS tablet Take 5,000 Units by mouth 3 (three) times a week.   diclofenac sodium (VOLTAREN) 1 % GEL Apply 2 g topically daily as needed. For shoulder pain per patient   gabapentin (NEURONTIN) 100 MG capsule Take 1 capsule (100 mg total) by mouth at bedtime.   Multiple Vitamins-Minerals (CENTRUM SILVER PO) Take 1 tablet by mouth daily.   polyethylene glycol powder (GLYCOLAX/MIRALAX) 17 GM/SCOOP powder Take 17 g by mouth 2 (two) times daily as needed.   [DISCONTINUED] fluticasone (FLONASE) 50 MCG/ACT nasal spray Place 1 spray into both nostrils  daily.   [DISCONTINUED] linaclotide (LINZESS) 145 MCG CAPS capsule Take 1 capsule (145 mcg total) by mouth daily before breakfast. (Patient taking differently: Take 145 mcg by mouth daily as needed.)   [DISCONTINUED] olopatadine (PATANOL) 0.1 % ophthalmic solution Place 1 drop into both eyes 2 (two) times daily. (Patient taking differently: Place 1 drop into both eyes 2 (two) times daily. OTC)    ROS:  Review of Systems  Constitutional:  Negative for fever.       (+) episodes of "feeling hot" and then feels "needle pricks" on extremities, will resolve spontaneously within a few seconds  Eyes:        (+) dry eye  Respiratory:  Negative for shortness of breath.   Cardiovascular:  Negative for chest pain.  Neurological:  Negative for dizziness and loss of consciousness.    Objective:   Today's Vitals: BP 118/78 (BP Location: Left Arm, Patient Position: Sitting, Cuff Size: Normal)    Pulse 65    Temp 99.2 F (37.3 C) (Oral)    Ht '5\' 5"'$  (1.651 m)    Wt 133 lb (60.3 kg)    SpO2 100%    BMI 22.13 kg/m  Vitals with BMI 05/16/2021 04/18/2021 04/10/2021  Height '5\' 5"'$  - '5\' 5"'$   Weight 133 lbs 136 lbs 8 oz 136 lbs 3 oz  BMI 22.13 96.75 91.63  Systolic 846 659 935  Diastolic 78 66 63  Pulse 65 59 54     Physical Exam Vitals reviewed. Exam conducted with a chaperone present.  Constitutional:      Appearance: Normal appearance.  HENT:     Head: Normocephalic and atraumatic.     Right Ear: Ear canal and external ear normal. There is impacted cerumen.     Left Ear: Ear canal and external ear normal. There is impacted cerumen.  Eyes:     General:        Right eye: No discharge.        Left eye: No discharge.     Extraocular Movements: Extraocular movements intact.     Conjunctiva/sclera: Conjunctivae normal.     Pupils: Pupils are equal, round, and reactive to light.  Neck:     Vascular: No carotid bruit.  Cardiovascular:     Rate and Rhythm: Normal rate and regular rhythm.     Pulses:  Normal pulses.     Heart sounds: Normal heart sounds. No murmur heard. Pulmonary:     Effort: Pulmonary effort is normal.  Breath sounds: Normal breath sounds.  Chest:  Breasts:    Breasts are symmetrical.     Right: Normal.     Left: Normal.  Abdominal:     General: Abdomen is flat. Bowel sounds are normal. There is no distension.     Palpations: Abdomen is soft. There is no mass.     Tenderness: There is no abdominal tenderness.  Musculoskeletal:        General: No tenderness.     Cervical back: Neck supple. No muscular tenderness.     Right lower leg: No edema.     Left lower leg: No edema.  Lymphadenopathy:     Cervical: No cervical adenopathy.     Upper Body:     Right upper body: No supraclavicular adenopathy.     Left upper body: No supraclavicular adenopathy.  Skin:    General: Skin is warm and dry.  Neurological:     General: No focal deficit present.     Mental Status: She is alert and oriented to person, place, and time.     Motor: No weakness.     Gait: Gait normal.  Psychiatric:        Mood and Affect: Mood normal.        Behavior: Behavior normal.        Judgment: Judgment normal.         Assessment and Plan   1. Encounter for general adult medical examination with abnormal findings   2. Allergic conjunctivitis of both eyes   3. Neutropenia, unspecified type ([place])   4. Vitamin D deficiency   5. Encounter for screening mammogram for malignant neoplasm of breast   6. Graves disease   7. Constipation, unspecified constipation type   8. Prediabetes      Plan: 1.-6., 8. See plan via problem list below. 7.  Towards end of visit patient mention she needs a refill on her Linzess which she takes only as needed for constipation.  Refill ordered today.   Tests ordered Orders Placed This Encounter  Procedures   MM DIGITAL SCREENING BILATERAL   CBC with Differential/Platelet   Vitamin D (25 hydroxy)   Basic Metabolic Panel (BMET)   Pathologist  smear review      Meds ordered this encounter  Medications   olopatadine (PATANOL) 0.1 % ophthalmic solution    Sig: Place 1 drop into both eyes 2 (two) times daily.    Dispense:  5 mL    Refill:  5    Order Specific Question:   Supervising Provider    Answer:   BURNS, STACY J [[id number]]   fluticasone (FLONASE) 50 MCG/ACT nasal spray    Sig: Place 1 spray into both nostrils daily.    Dispense:  16 g    Refill:  5    Order Specific Question:   Supervising Provider    Answer:   BURNS, Claudina Lick [[id number]]   linaclotide (LINZESS) 145 MCG CAPS capsule    Sig: Take 1 capsule (145 mcg total) by mouth daily as needed.    Dispense:  30 capsule    Refill:  2    Order Specific Question:   Supervising Provider    Answer:   Binnie Rail [id number]    Patient to follow-up in 6 months or sooner as needed.  In addition to annual physical exam I also did an office visit to address her concerns as discussed above.  Ailene Ards, NP

## 2021-05-16 NOTE — Patient Instructions (Signed)
Mediterranean Diet °A Mediterranean diet refers to food and lifestyle choices that are based on the traditions of countries located on the [place]. It focuses on eating more fruits, vegetables, whole grains, beans, nuts, seeds, and heart-healthy fats, and eating less dairy, meat, eggs, and processed foods with added sugar, salt, and fat. This way of eating has been shown to help prevent certain conditions and improve outcomes for people who have chronic diseases, like kidney disease and heart disease. °What are tips for following this plan? °Reading food labels °Check the serving size of packaged foods. For foods such as rice and pasta, the serving size refers to the amount of cooked product, not dry. °Check the total fat in packaged foods. Avoid foods that have saturated fat or trans fats. °Check the ingredient list for added sugars, such as corn syrup. °Shopping ° °Buy a variety of foods that offer a balanced diet, including: °Fresh fruits and vegetables (produce). °Grains, beans, nuts, and seeds. Some of these may be available in unpackaged forms or large amounts (in bulk). °Fresh seafood. °Poultry and eggs. °Low-fat dairy products. °Buy whole ingredients instead of prepackaged foods. °Buy fresh fruits and vegetables in-season from local farmers markets. °Buy plain frozen fruits and vegetables. °If you do not have access to quality fresh seafood, buy precooked frozen shrimp or canned fish, such as tuna, salmon, or sardines. °Stock your pantry so you always have certain foods on hand, such as olive oil, canned tuna, canned tomatoes, rice, pasta, and beans. °Cooking °Cook foods with extra-virgin olive oil instead of using butter or other vegetable oils. °Have meat as a side dish, and have vegetables or grains as your main dish. This means having meat in small portions or adding small amounts of meat to foods like pasta or stew. °Use beans or vegetables instead of meat in common dishes like chili or  lasagna. °Experiment with different cooking methods. Try roasting, broiling, steaming, and sautéing vegetables. °Add frozen vegetables to soups, stews, pasta, or rice. °Add nuts or seeds for added healthy fats and plant protein at each meal. You can add these to yogurt, salads, or vegetable dishes. °Marinate fish or vegetables using olive oil, lemon juice, garlic, and fresh herbs. °Meal planning °Plan to eat one vegetarian meal one day each week. Try to work up to two vegetarian meals, if possible. °Eat seafood two or more times a week. °Have healthy snacks readily available, such as: °Vegetable sticks with hummus. °Greek yogurt. °Fruit and nut trail mix. °Eat balanced meals throughout the week. This includes: °Fruit: 2-3 servings a day. °Vegetables: 4-5 servings a day. °Low-fat dairy: 2 servings a day. °Fish, poultry, or lean meat: 1 serving a day. °Beans and legumes: 2 or more servings a week. °Nuts and seeds: 1-2 servings a day. °Whole grains: 6-8 servings a day. °Extra-virgin olive oil: 3-4 servings a day. °Limit red meat and sweets to only a few servings a month. °Lifestyle ° °Cook and eat meals together with your family, when possible. °Drink enough fluid to keep your urine pale yellow. °Be physically active every day. This includes: °Aerobic exercise like running or swimming. °Leisure activities like gardening, walking, or housework. °Get 7-8 hours of sleep each night. °If recommended by your health care provider, drink red wine in moderation. This means 1 glass a day for nonpregnant women and 2 glasses a day for men. A glass of wine equals 5 oz (150 mL). °What foods should I eat? °Fruits °Apples. Apricots. Avocado. Berries. Bananas. Cherries. Dates.   Figs. Grapes. Lemons. Melon. Oranges. Peaches. Plums. Pomegranate. °Vegetables °Artichokes. Beets. Broccoli. Cabbage. Carrots. Eggplant. Green beans. Chard. Kale. Spinach. Onions. Leeks. Peas. Squash. Tomatoes. Peppers. Radishes. °Grains °Whole-grain pasta. Brown  rice. Bulgur wheat. Polenta. Couscous. Whole-wheat bread. Oatmeal. Quinoa. °Meats and other proteins °Beans. Almonds. Sunflower seeds. Pine nuts. Peanuts. Cod. Salmon. Scallops. Shrimp. Tuna. Tilapia. Clams. Oysters. Eggs. Poultry without skin. °Dairy °Low-fat milk. Cheese. Greek yogurt. °Fats and oils °Extra-virgin olive oil. Avocado oil. Grapeseed oil. °Beverages °Water. Red wine. Herbal tea. °Sweets and desserts °Greek yogurt with honey. Baked apples. Poached pears. Trail mix. °Seasonings and condiments °Basil. Cilantro. Coriander. Cumin. Mint. Parsley. Sage. Rosemary. Tarragon. Garlic. Oregano. Thyme. Pepper. Balsamic vinegar. Tahini. Hummus. Tomato sauce. Olives. Mushrooms. °The items listed above may not be a complete list of foods and beverages you can eat. Contact a dietitian for more information. °What foods should I limit? °This is a list of foods that should be eaten rarely or only on special occasions. °Fruits °Fruit canned in syrup. °Vegetables °Deep-fried potatoes (french fries). °Grains °Prepackaged pasta or rice dishes. Prepackaged cereal with added sugar. Prepackaged snacks with added sugar. °Meats and other proteins °Beef. Pork. Lamb. Poultry with skin. Hot dogs. Bacon. °Dairy °Ice cream. Sour cream. Whole milk. °Fats and oils °Butter. Canola oil. Vegetable oil. Beef fat (tallow). Lard. °Beverages °Juice. Sugar-sweetened soft drinks. Beer. Liquor and spirits. °Sweets and desserts °Cookies. Cakes. Pies. Candy. °Seasonings and condiments °Mayonnaise. Pre-made sauces and marinades. °The items listed above may not be a complete list of foods and beverages you should limit. Contact a dietitian for more information. °Summary °The Mediterranean diet includes both food and lifestyle choices. °Eat a variety of fresh fruits and vegetables, beans, nuts, seeds, and whole grains. °Limit the amount of red meat and sweets that you eat. °If recommended by your health care provider, drink red wine in moderation.  This means 1 glass a day for nonpregnant women and 2 glasses a day for men. A glass of wine equals 5 oz (150 mL). °This information is not intended to replace advice given to you by your health care provider. Make sure you discuss any questions you have with your health care provider. °Document Revised: 03/31/2019 Document Reviewed: 01/26/2019 °Elsevier Patient Education © 2022 [employer]vier Inc. ° °

## 2021-05-16 NOTE — Assessment & Plan Note (Signed)
Overall she looks well on exam today.  We will order screening mammogram. ?

## 2021-05-16 NOTE — Assessment & Plan Note (Signed)
We discussed for now we will treat with lifestyle and recommend she increase her exercise as tolerated and focus on healthy diet.  Recommend Mediterranean diet and have provided her with education materials regarding this diet today. ?

## 2021-05-16 NOTE — Assessment & Plan Note (Signed)
Chronic.  We will recheck serum vitamin D level as well as metabolic panel to see if levels have increased with her increase in vitamin D3 supplementation.  Further recommendations may be made based upon these results. ?

## 2021-05-19 LAB — PATHOLOGIST SMEAR REVIEW

## 2021-05-20 ENCOUNTER — Other Ambulatory Visit: Payer: Self-pay

## 2021-05-20 ENCOUNTER — Inpatient Hospital Stay: Payer: 59 | Attending: Genetic Counselor | Admitting: Genetic Counselor

## 2021-05-20 ENCOUNTER — Inpatient Hospital Stay: Payer: 59

## 2021-05-20 DIAGNOSIS — Z800 Family history of malignant neoplasm of digestive organs: Secondary | ICD-10-CM

## 2021-05-20 DIAGNOSIS — Z8042 Family history of malignant neoplasm of prostate: Secondary | ICD-10-CM | POA: Diagnosis not present

## 2021-05-20 LAB — GENETIC SCREENING ORDER

## 2021-05-21 ENCOUNTER — Ambulatory Visit
Admission: RE | Admit: 2021-05-21 | Discharge: 2021-05-21 | Disposition: A | Discharge status: Home / Self Care | Payer: 59 | Source: Ambulatory Visit | Attending: Internal Medicine | Admitting: Internal Medicine

## 2021-05-21 ENCOUNTER — Encounter: Payer: Self-pay | Admitting: Genetic Counselor

## 2021-05-21 DIAGNOSIS — R1032 Left lower quadrant pain: Secondary | ICD-10-CM

## 2021-05-21 DIAGNOSIS — Z8042 Family history of malignant neoplasm of prostate: Secondary | ICD-10-CM | POA: Insufficient documentation

## 2021-05-21 DIAGNOSIS — Z800 Family history of malignant neoplasm of digestive organs: Secondary | ICD-10-CM | POA: Insufficient documentation

## 2021-05-21 NOTE — Progress Notes (Signed)
REFERRING PROVIDER: ?Ladene Artist, MD ?[facility] ? ?PRIMARY PROVIDER:  ?Ailene Ards, NP ? ?PRIMARY REASON FOR VISIT:  ?1. FHx: pancreatic cancer   ?2. Family history of prostate cancer   ?3. Family history of colon cancer   ? ?HISTORY OF PRESENT ILLNESS:   ?Brandi Stevens, a 66 y.o. female, was seen for a [place] cancer genetics consultation at the request of Dr. Fuller Plan due to a family history of cancer.  Brandi Stevens presents to clinic today to discuss the possibility of a hereditary predisposition to cancer, to discuss genetic testing, and to further clarify her future cancer risks, as well as potential cancer risks for family members.  ? ?Brandi Stevens is a 66 y.o. female with no personal history of cancer.   ? ?CANCER HISTORY:  ?Oncology History  ? No history exists.  ? ? ?RISK FACTORS:  ?Menarche was at age 49.  ?First live birth at age 38.  ?OCP use for approximately  >20  years.  ?Ovaries intact: yes.  ?Uterus intact: no.  ?Menopausal status: postmenopausal.  ?HRT use: 0 years. ?Colonoscopy: yes, 3 total;  13 tubular adenomas to date . ?Mammogram within the last year: no. ?Number of breast biopsies: 0. ?Any excessive radiation exposure in the past: no ? ?Past Medical History:  ?Diagnosis Date  ? Arthritis   ? Bilateral shoulders.  ? Bradycardia   ? Exercise stress test normal 2008  ? Diverticulitis 2012  ? Emphysema   ? Last PFTs 1980s, not on current inhaler thearpy  ? Fibroadenoma of breast 01/2012  ? R breast  ? Graves disease 1980s  ? RAI in 2008, not on any thyroid replacement hormone  ? Osteoarthritis of shoulder   ? Bilateral arthritis/bursitis  ? Rotator cuff tear arthropathy   ? R cuff  ? Seasonal allergies   ? SVD (spontaneous vaginal delivery)   ? x 3  ? Tubular adenoma of colon 01/2015  ? ? ?Past Surgical History:  ?Procedure Laterality Date  ? ABDOMINAL HYSTERECTOMY  1989  ? BREAST BIOPSY  12/22/2011  ? Procedure: BREAST BIOPSY;  Surgeon: Ralene Ok, MD;  Location: [facility];   Service: General;  Laterality: Right;  Excision right breast mass  ? BREAST EXCISIONAL BIOPSY    ? CHOLECYSTECTOMY  1989  ? COLONOSCOPY  01/2015  ? hx polyps-[facility]  ? COLONOSCOPY  2019  ? MS-MAC-prep good (goly)-TA- repeat 3 yrs  ? EYE SURGERY Left   ? MULTIPLE TOOTH EXTRACTIONS    ? POLYPECTOMY  2019  ? TA  ? ROTATOR CUFF REPAIR    ? R shoulder [facility]2 Dr. Kayleen Memos [facility]  ? ROTATOR CUFF REPAIR Right   ? SHOULDER ARTHROSCOPY W/ SUPERIOR LABRAL ANTERIOR POSTERIOR LESION REPAIR Right   ? bursitis  ? TUBAL LIGATION    ? ? ?Social History  ? ?Socioeconomic History  ? Marital status: Divorced  ?  Spouse name: Not on file  ? Number of children: 3  ? Years of education: Not on file  ? Highest education level: Not on file  ?Occupational History  ? Occupation: CNA  ?  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 Smoking status: Former  ?  Packs/day: 1.00  ?  Years: 20.00  ?  Pack years: 20.00  ?  Types: Cigarettes  ?  Start date: 11/08/2011  ? Smokeless tobacco: Never  ?Vaping Use  ? Vaping Use: Never used  ?Substance and Sexual Activity  ? Alcohol use: No  ?  Alcohol/week: 0.0 standard drinks  ? Drug use: No  ? Sexual activity: Not Currently  ?  Birth control/protection: Post-menopausal  ?Other Topics Concern  ? Not on file  ?Social History Narrative  ? Live in [place], lives alone. Sons nearby.   ? ?Social Determinants of Health  ? ?Financial Resource Strain: Not on file  ?Food Insecurity: Not on file  ?Transportation Needs: Not on file  ?Physical Activity: Not on file  ?Stress: Not on file  ?Social Connections: Not on file  ?  ? ?FAMILY HISTORY:  ?We obtained a detailed, 4-generation family history.  Significant diagnoses are listed below: ?Family History  ?Problem Relation Age of Onset  ? Colon polyps Mother 69  ? Pancreatic cancer Mother 11  ?     pancreatic  ? Hearing loss Father   ? Stroke Father 64  ?     ischemic stroke  ? Prostate cancer Brother 41  ?     with mets  ? Gastric cancer Maternal Aunt   ?      gastric  ? Colon polyps Maternal Grandfather 67  ? Colon cancer Maternal Grandfather 35  ? Rectal cancer Neg Hx   ? Stomach cancer Neg Hx   ? Esophageal cancer Neg Hx   ? ? ? ?Brandi Stevens's brother was diagnosed with metastatic prostate cancer at age 29. Her mother was diagnosed with pancreatic cancer at age 38, she died at age 31. Her maternal aunt was diagnosed with gastric cancer at an unknown age. Her maternal grandfather was diagnosed with colon cancer at age 41, he died in his 47s. Her paternal great aunt was diagnosed with bladder cancer, she is deceased. Brandi Stevens is unaware of previous family history of genetic testing for hereditary cancer risks. There is no reported Ashkenazi Jewish ancestry.  ? ?GENETIC COUNSELING ASSESSMENT: Ms. Popko is a 66 y.o. female with a family history of cancer which is somewhat suggestive of a hereditary predisposition to cancer. We, therefore, discussed and recommended the following at today's visit.  ? ?DISCUSSION: We discussed that 5 - 10% of cancer is hereditary, with most cases of hereditary prostate and pancreatic cancer associated with BRCA1/2.  There are other genes that can be associated with hereditary prostate and pancreatic cancer syndromes.  We discussed that testing is beneficial for several reasons, including knowing about other cancer risks, identifying potential screening and risk-reduction options that may be appropriate, and to understanding if other family members could be at risk for cancer and allowing them to undergo genetic testing. ? ?We reviewed the characteristics, features and inheritance patterns of hereditary cancer syndromes. We also discussed genetic testing, including the appropriate family members to test, the process of testing, insurance coverage and turn-around-time for results. We discussed the implications of a negative, positive, carrier and/or variant of uncertain significant result. We discussed that negative results would be  uninformative given that Ms. Transue does not have a personal history of cancer. We recommended Ms. Holleran pursue genetic testing for a panel that contains genes associated with prostate and pancreatic cancer. ? ?Ms. Casas was offered a common hereditary cancer panel (47 genes) and an expanded pan-cancer panel (77 genes). Ms. Rochefort was informed of the benefits and limitations of each panel, including that expanded pan-cancer panels contain several genes that do not have clear management guidelines at this point in time.  We also discussed that as the number of genes included on a panel increases, the chances of variants of uncertain significance increases.  After considering the benefits and limitations of each gene panel, Ms. Renbarger elected to have [facility] CancerNext-Expanded Panel+RNA. ? ?The CancerNext-Expanded gene panel offered by [facility] and includes sequencing, rearrangement, and RNA analysis for the following 77 genes: AIP, ALK, APC, ATM, AXIN2, BAP1, BARD1, BLM, BMPR1A, BRCA1, BRCA2, BRIP1, CDC73, CDH1, CDK4, CDKN1B, CDKN2A, CHEK2, CTNNA1, DICER1, FANCC, FH, FLCN, GALNT12, KIF1B, LZTR1, MAX, MEN1, MET, MLH1, MSH2, MSH3, MSH6, MUTYH, NBN, NF1, NF2, NTHL1, PALB2, PHOX2B, PMS2, POT1, PRKAR1A, PTCH1, PTEN, RAD51C, RAD51D, RB1, RECQL, RET, SDHA, SDHAF2, SDHB, SDHC, SDHD, SMAD4, SMARCA4, SMARCB1, SMARCE1, STK11, SUFU, TMEM127, TP53, TSC1, TSC2, VHL and XRCC2 (sequencing and deletion/duplication); EGFR, EGLN1, HOXB13, KIT, MITF, PDGFRA, POLD1, and POLE (sequencing only); EPCAM and GREM1 (deletion/duplication only).   ? ?Based on Ms. Driscoll's family history of cancer, she meets medical criteria for genetic testing. Despite that she meets criteria, she may still have an out of pocket cost. We discussed that if her out of pocket cost for testing is over $100, the laboratory will call and confirm whether she wants to proceed with testing.  If the out of pocket cost of testing is less than $100 she will be billed by  the genetic testing laboratory.  ? ?We discussed that some people do not want to undergo genetic testing due to fear of genetic discrimination.  A federal law called the Genetic Information Non-Discrimination Act (GINA) of

## 2021-06-06 ENCOUNTER — Encounter: Payer: Self-pay | Admitting: Genetic Counselor

## 2021-06-06 ENCOUNTER — Telehealth: Payer: Self-pay | Admitting: Genetic Counselor

## 2021-06-06 DIAGNOSIS — Z1379 Encounter for other screening for genetic and chromosomal anomalies: Secondary | ICD-10-CM | POA: Insufficient documentation

## 2021-06-06 NOTE — Telephone Encounter (Signed)
I contacted Ms. Schwartzman to discuss her genetic testing results. No pathogenic variants were identified in the 77 genes analyzed. 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 Detailed clinic note to follow. ? ?The test report has been scanned into EPIC and is located under the Molecular Pathology section of the Results Review tab.  A portion of the result report is included below for reference.  ? ?Lucille Passy, MS, [place] ?Genetic Counselor ?[email or web address] ?(P) [phone] ? ? ?

## 2021-06-09 ENCOUNTER — Ambulatory Visit: Payer: Self-pay | Admitting: Genetic Counselor

## 2021-06-09 DIAGNOSIS — Z1379 Encounter for other screening for genetic and chromosomal anomalies: Secondary | ICD-10-CM

## 2021-06-09 NOTE — Progress Notes (Signed)
HPI:   ?Brandi Stevens was previously seen in the [facility] clinic due to a family history of cancer and concerns regarding a hereditary predisposition to cancer. Please refer to our prior cancer genetics clinic note for more information regarding our discussion, assessment and recommendations, at the time. Brandi Stevens recent genetic test results were disclosed to her, as were recommendations warranted by these results. These results and recommendations are discussed in more detail below. ? ?CANCER HISTORY:  ?Oncology History  ? No history exists.  ? ? ?FAMILY HISTORY:  ?We obtained a detailed, 4-generation family history.  Significant diagnoses are listed below: ?Family History  ?Problem Relation Age of Onset  ? Colon polyps Mother 8  ? Pancreatic cancer Mother 62  ?     pancreatic  ? Hearing loss Father   ? Stroke Father 23  ?     ischemic stroke  ? Prostate cancer Brother 53  ?     with mets  ? Gastric cancer Maternal Aunt   ?     gastric  ? Colon polyps Maternal Grandfather 40  ? Colon cancer Maternal Grandfather 56  ? Rectal cancer Neg Hx   ? Stomach cancer Neg Hx   ? Esophageal cancer Neg Hx   ? ?    ?  ?  ? ?Brandi Stevens's brother was diagnosed with metastatic prostate cancer at age 64. Her mother was diagnosed with pancreatic cancer at age 78, she died at age 34. Her maternal aunt was diagnosed with gastric cancer at an unknown age. Her maternal grandfather was diagnosed with colon cancer at age 67, he died in his 41s. Her paternal great aunt was diagnosed with bladder cancer, she is deceased. Brandi Stevens is unaware of previous family history of genetic testing for hereditary cancer risks. There is no reported Ashkenazi Jewish ancestry.  ? ?GENETIC TEST RESULTS:  ?The [facility] CancerNext-Expanded Panel found no pathogenic mutations.  ? ?The CancerNext-Expanded gene panel offered by [facility] and includes sequencing, rearrangement, and RNA analysis for the following 77 genes: AIP, ALK, APC, ATM,  AXIN2, BAP1, BARD1, BLM, BMPR1A, BRCA1, BRCA2, BRIP1, CDC73, CDH1, CDK4, CDKN1B, CDKN2A, CHEK2, CTNNA1, DICER1, FANCC, FH, FLCN, GALNT12, KIF1B, LZTR1, MAX, MEN1, MET, MLH1, MSH2, MSH3, MSH6, MUTYH, NBN, NF1, NF2, NTHL1, PALB2, PHOX2B, PMS2, POT1, PRKAR1A, PTCH1, PTEN, RAD51C, RAD51D, RB1, RECQL, RET, SDHA, SDHAF2, SDHB, SDHC, SDHD, SMAD4, SMARCA4, SMARCB1, SMARCE1, STK11, SUFU, TMEM127, TP53, TSC1, TSC2, VHL and XRCC2 (sequencing and deletion/duplication); EGFR, EGLN1, HOXB13, KIT, MITF, PDGFRA, POLD1, and POLE (sequencing only); EPCAM and GREM1 (deletion/duplication only).   ? ?The test report has been scanned into EPIC and is located under the Molecular Pathology section of the Results Review tab.  A portion of the result report is included below for reference. Genetic testing reported out on 06/06/2021.  ? ? ? ? ? 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  At this time, it is unknown if this variant is associated with an increased risk for cancer or if it is benign, but most uncertain variants are reclassified to benign. It should not be used to make medical management decisions. With time, we suspect the laboratory will determine the significance of this variant, if any. If the laboratory reclassifies this variant, we will attempt to contact Brandi Stevens to discuss it further.  ? ?Even though a pathogenic variant was not identified, possible explanations for the cancer in the family may include: ?There may be  no hereditary risk for cancer in the family. The cancers in her family may be due to other genetic or environmental factors. ?There may be a gene mutation in one of these genes that current testing methods cannot detect, but that chance is small. ?There could be another gene that has not yet been discovered, or that we have not yet tested, that is responsible for the cancer diagnoses in the family.  ?It is also possible there is a hereditary cause  for the cancer in the family that Brandi Stevens did not inherit. ? ?Therefore, it is important to remain in touch with cancer genetics in the future so that we can continue to offer Brandi Stevens the most up to date genetic testing.  ? ?ADDITIONAL GENETIC TESTING:  ?We discussed with Brandi Stevens that her genetic testing was fairly extensive.  If there are genes identified to increase cancer risk that can be analyzed in the future, we would be happy to discuss and coordinate this testing at that time.   ? ?CANCER SCREENING RECOMMENDATIONS:  ?Brandi Stevens's test result is considered negative (normal).  This means that we have not identified a hereditary cause for her family history of cancer at this time.  ? ?An individual's cancer risk and medical management are not determined by genetic test results alone. Overall cancer risk assessment incorporates additional factors, including personal medical history, family history, and any available genetic information that may result in a personalized plan for cancer prevention and surveillance. Therefore, it is recommended she continue to follow the cancer management and screening guidelines provided by her primary healthcare provider. ? ?RECOMMENDATIONS FOR FAMILY MEMBERS:   ?Since she did not inherit a mutation in a cancer predisposition gene included on this panel, her children could not have inherited a mutation from her in one of these genes. ?Other members of the family may still carry a pathogenic variant in one of these genes that Brandi Stevens did not inherit. Based on the family history, we recommend her brother have genetic counseling and testing.  ?We do not recommend familial testing for the BRIP1 variant of uncertain significance (VUS). ? ?FOLLOW-UP:  ?Cancer genetics is a rapidly advancing field and it is possible that new genetic tests will be appropriate for her and/or her family members in the future. We encouraged her to remain in contact with cancer genetics on an annual  basis so we can update her personal and family histories and let her know of advances in cancer genetics that may benefit this family.  ? ?Our contact number was provided. Brandi Stevens questions were answered to her satisfaction, and she knows she is welcome to call us at anytime with additional questions or concerns.  ? ?Lucille Passy, MS, [place] ?Genetic Counselor ?[email or web address] ?(P) [phone] ? ? ?

## 2021-06-12 ENCOUNTER — Ambulatory Visit
Admission: RE | Admit: 2021-06-12 | Discharge: 2021-06-12 | Disposition: A | Discharge status: Home / Self Care | Payer: PRIVATE HEALTH INSURANCE | Source: Ambulatory Visit | Attending: Nurse Practitioner | Admitting: Nurse Practitioner

## 2021-06-12 DIAGNOSIS — Z1231 Encounter for screening mammogram for malignant neoplasm of breast: Secondary | ICD-10-CM

## 2021-06-18 ENCOUNTER — Encounter: Payer: Self-pay | Admitting: Genetic Counselor

## 2021-11-20 ENCOUNTER — Ambulatory Visit (INDEPENDENT_AMBULATORY_CARE_PROVIDER_SITE_OTHER): Payer: PPO | Admitting: Nurse Practitioner

## 2021-11-20 VITALS — BP 110/64 | HR 89 | Temp 97.6°F | Ht 65.0 in | Wt 137.5 lb

## 2021-11-20 DIAGNOSIS — R7303 Prediabetes: Secondary | ICD-10-CM

## 2021-11-20 DIAGNOSIS — D709 Neutropenia, unspecified: Secondary | ICD-10-CM | POA: Diagnosis not present

## 2021-11-20 DIAGNOSIS — E559 Vitamin D deficiency, unspecified: Secondary | ICD-10-CM | POA: Diagnosis not present

## 2021-11-20 DIAGNOSIS — Z23 Encounter for immunization: Secondary | ICD-10-CM

## 2021-11-20 DIAGNOSIS — G629 Polyneuropathy, unspecified: Secondary | ICD-10-CM | POA: Diagnosis not present

## 2021-11-20 DIAGNOSIS — Z122 Encounter for screening for malignant neoplasm of respiratory organs: Secondary | ICD-10-CM | POA: Diagnosis not present

## 2021-11-20 DIAGNOSIS — E0500 Thyrotoxicosis with diffuse goiter without thyrotoxic crisis or storm: Secondary | ICD-10-CM

## 2021-11-20 LAB — BASIC METABOLIC PANEL
BUN: 7 mg/dL (ref 6–23)
CO2: 28 mEq/L (ref 19–32)
Calcium: 9.9 mg/dL (ref 8.4–10.5)
Chloride: 106 mEq/L (ref 96–112)
Creatinine, Ser: 0.81 mg/dL (ref 0.40–1.20)
GFR: 76.29 mL/min (ref >60.00)
Glucose, Bld: 96 mg/dL (ref 70–99)
Potassium: 4.7 mEq/L (ref 3.5–5.1)
Sodium: 141 mEq/L (ref 135–145)

## 2021-11-20 LAB — T4, FREE: Free T4: 0.71 ng/dL (ref 0.60–1.60)

## 2021-11-20 LAB — CBC
HCT: 42.4 % (ref 36.0–46.0)
Hemoglobin: 14 g/dL (ref 12.0–15.0)
MCHC: 32.9 g/dL (ref 30.0–36.0)
MCV: 91.1 fl (ref 78.0–100.0)
Platelets: 311 10*3/uL (ref 150.0–400.0)
RBC: 4.65 Mil/uL (ref 3.87–5.11)
RDW: 13.8 % (ref 11.5–15.5)
WBC: 4.2 10*3/uL (ref 4.0–10.5)

## 2021-11-20 LAB — VITAMIN D 25 HYDROXY (VIT D DEFICIENCY, FRACTURES): VITD: 32.75 ng/mL (ref 30.00–100.00)

## 2021-11-20 LAB — T3, FREE: T3, Free: 3.5 pg/mL (ref 2.3–4.2)

## 2021-11-20 LAB — HEMOGLOBIN A1C: Hgb A1c MFr Bld: 6 % (ref 4.6–6.5)

## 2021-11-20 LAB — TSH: TSH: 5.01 u[IU]/mL (ref 0.35–5.50)

## 2021-11-20 MED ORDER — GABAPENTIN 100 MG PO CAPS: 100.0000 mg | ORAL_CAPSULE | Freq: Every day | ORAL | 2 refills | Status: AC

## 2021-11-20 NOTE — Patient Instructions (Signed)
Consider shingles vaccine, tdap vaccine, covid booster, and RSV vaccine at your pharmacy

## 2021-11-20 NOTE — Assessment & Plan Note (Addendum)
We will check serum vitamin D as well as calcium level, further recommendations may be made based upon these results.

## 2021-11-20 NOTE — Assessment & Plan Note (Signed)
We will check CBC, if she still has neutropenia will order referral to hematologist.

## 2021-11-20 NOTE — Assessment & Plan Note (Signed)
We will check thyroid panel today for further evaluation, further recommendations may be made based upon these results.  Will refer to ophthalmologist per patient request we will put in notes of referral to send to ophthalmologist who specializes in thyroid eye disease.  Patient is aware she may need to travel outside of [place] for evaluation, she is agreeable to this as well.

## 2021-11-20 NOTE — Assessment & Plan Note (Signed)
We will administer flu and Prevnar 20 today.  Patient encouraged to consider getting Tdap, shingles, COVID-19, and RSV vaccinations.  She is encouraged to discuss this with her pharmacy versus coming back to our office for Tdap and shingles vaccine if insurance will cover these being administered in a doctor's office.  She reports her understanding.  Patient provided with vaccine information sheet for flu and Prevnar 20 today.

## 2021-11-20 NOTE — Assessment & Plan Note (Addendum)
Symptoms consistent with neuropathy, etiology unclear.  We will trial gabapentin 100 mg by mouth at bedtime.  Patient will follow-up in 4 to 8 weeks to see how she is tolerating medication.  May consider increasing dose, will refer to vascular surgeon for evaluation of vascular status as well.

## 2021-11-20 NOTE — Assessment & Plan Note (Signed)
We will check A1c today, further recommendations may be made based upon the results.

## 2021-11-20 NOTE — Assessment & Plan Note (Signed)
Pack-year 50+, quit smoking 4 years ago.  She is 66 years old.  She would qualify for lung cancer screening via CT scan of the chest.  Will order this test today, further recommendations may be made based upon the results.

## 2021-11-20 NOTE — Progress Notes (Signed)
Established Patient Office Visit  Subjective   Patient ID: Brandi Stevens, female    DOB: 1955-08-24  Age: 66 y.o. MRN: [id number]  Chief Complaint  Patient presents with   Follow-up    Wants a pulmonary scan   Prediabetes    Prediabetes: Last A1c 6.0, patient due to have this checked again today.  Graves' disease: Has completed treatment with radioactive iodine in 2008.  Not currently on thyroid placement therapy.  Last TSH around 6, however T3 and T4 within normal limits.  She does report that she feels that she is sweating a little more frequently as well as having more frequent hair loss.  She has dry eye and feels she needs to be evaluated by ophthalmologist that specializes in thyroid eye disease and would like referral today.  Neutropenia: Isolated finding on last 2 CBCs, seen as low as 3.2 and was 3.8 last time checked.  Due to have this rechecked today.  Patient would like to see hematologist if white blood cell counts remain low.  Vitamin D deficiency: Has increase vitamin D3 intake to 5000 IUs a few times a week.  Due to have serum checked today.  Last vitamin D level was 28.66  Neuropathy: Reports pins and needle feelings in her bilateral feet.  Severity waxes and wanes but sometimes so severe she cannot wear socks.  She would like vascular status evaluated.  Health maintenance: Due for multiple vaccinations would like to discuss this further today.  She also reports that she quit smoking 4 years ago but reports that she is smoked for 20 to 25 years, and reports on average 2.5 ppd (pack-year of 50+).  She has a cousin that recently got diagnosed with lung cancer she would like to discuss lung cancer screening today.    Review of Systems  Constitutional:  Positive for diaphoresis (upper lip sweating). Negative for malaise/fatigue and weight loss.  HENT:  Positive for congestion.        (+) hair loss  Eyes:  Negative for blurred vision and double vision.  Respiratory:   Positive for cough (has chronic cough). Negative for shortness of breath.   Cardiovascular:  Negative for chest pain and palpitations.  Neurological:  Positive for sensory change (chronic neuropathy in feet) and headaches (mild, intermittent ("not very often")).      Objective:     BP 110/64 (BP Location: Left Arm, Patient Position: Sitting, Cuff Size: Normal)   Pulse 89   Temp 97.6 F (36.4 C) (Oral)   Ht '5\' 5"'$  (1.651 m)   Wt 137 lb 8 oz (62.4 kg)   SpO2 99%   BMI 22.88 kg/m    Physical Exam Vitals reviewed.  Constitutional:      General: She is not in acute distress.    Appearance: Normal appearance.  HENT:     Head: Normocephalic and atraumatic.  Neck:     Vascular: No carotid bruit.  Cardiovascular:     Rate and Rhythm: Normal rate and regular rhythm.     Pulses: Normal pulses.     Heart sounds: Normal heart sounds.  Pulmonary:     Effort: Pulmonary effort is normal.     Breath sounds: Normal breath sounds.  Skin:    General: Skin is warm and dry.  Neurological:     General: No focal deficit present.     Mental Status: She is alert and oriented to person, place, and time.  Psychiatric:  Mood and Affect: Mood normal.        Behavior: Behavior normal.        Judgment: Judgment normal.      No results found for any visits on 11/20/21.    The 10-year ASCVD risk score (Arnett DK, et al., 2019) is: 5.1%    Assessment & Plan:   Problem List Items Addressed This Visit       Endocrine   Graves disease    We will check thyroid panel today for further evaluation, further recommendations may be made based upon these results.  Will refer to ophthalmologist per patient request we will put in notes of referral to send to ophthalmologist who specializes in thyroid eye disease.  Patient is aware she may need to travel outside of [place] for evaluation, she is agreeable to this as well.      Relevant Orders   TSH   T3, free   T4, free   Ambulatory  referral to Ophthalmology     Nervous and Auditory   Neuropathy    Symptoms consistent with neuropathy, etiology unclear.  We will trial gabapentin 100 mg by mouth at bedtime.  Patient will follow-up in 4 to 8 weeks to see how she is tolerating medication.  May consider increasing dose, will refer to vascular surgeon for evaluation of vascular status as well.      Relevant Medications   gabapentin (NEURONTIN) 100 MG capsule   Other Relevant Orders   Ambulatory referral to Vascular Surgery     Other   Vitamin D deficiency    We will check serum vitamin D as well as calcium level, further recommendations may be made based upon these results.      Relevant Orders   VITAMIN D 25 Hydroxy (Vit-D Deficiency, Fractures)   Basic metabolic panel   Neutropenia (HCC)    We will check CBC, if she still has neutropenia will order referral to hematologist.      Relevant Orders   CBC   Prediabetes - Primary    We will check A1c today, further recommendations may be made based upon the results.      Relevant Orders   Hemoglobin A1c   Ambulatory referral to Vascular Surgery   Encounter for screening for lung cancer    Pack-year 50+, quit smoking 4 years ago.  She is 66 years old.  She would qualify for lung cancer screening via CT scan of the chest.  Will order this test today, further recommendations may be made based upon the results.      Relevant Orders   CT CHEST LUNG CA SCREEN LOW DOSE W/O CM   Need for vaccination    We will administer flu and Prevnar 20 today.  Patient encouraged to consider getting Tdap, shingles, COVID-19, and RSV vaccinations.  She is encouraged to discuss this with her pharmacy versus coming back to our office for Tdap and shingles vaccine if insurance will cover these being administered in a doctor's office.  She reports her understanding.  Patient provided with vaccine information sheet for flu and Prevnar 20 today.      Relevant Orders   Flu Vaccine QUAD High  Dose(Fluad) (Completed)   Pneumococcal conjugate vaccine 20-valent (Prevnar 20) (Completed)    Return in about 4 weeks (around 12/18/2021) for F/U with Youssouf Shipley.    Ailene Ards, NP

## 2021-12-01 ENCOUNTER — Other Ambulatory Visit: Payer: Self-pay | Admitting: *Deleted

## 2021-12-01 DIAGNOSIS — M79605 Pain in left leg: Secondary | ICD-10-CM

## 2021-12-11 ENCOUNTER — Encounter: Payer: Self-pay | Admitting: Vascular Surgery

## 2021-12-11 ENCOUNTER — Ambulatory Visit (HOSPITAL_COMMUNITY)
Admission: RE | Admit: 2021-12-11 | Discharge: 2021-12-11 | Disposition: A | Discharge status: Home / Self Care | Payer: PPO | Source: Ambulatory Visit | Attending: Vascular Surgery | Admitting: Vascular Surgery

## 2021-12-11 ENCOUNTER — Ambulatory Visit (INDEPENDENT_AMBULATORY_CARE_PROVIDER_SITE_OTHER): Payer: PPO | Admitting: Vascular Surgery

## 2021-12-11 VITALS — BP 133/82 | HR 53 | Temp 98.1°F | Resp 20 | Ht 65.0 in | Wt 138.0 lb

## 2021-12-11 DIAGNOSIS — M79605 Pain in left leg: Secondary | ICD-10-CM | POA: Insufficient documentation

## 2021-12-11 DIAGNOSIS — G609 Hereditary and idiopathic neuropathy, unspecified: Secondary | ICD-10-CM

## 2021-12-11 DIAGNOSIS — M79604 Pain in right leg: Secondary | ICD-10-CM | POA: Insufficient documentation

## 2021-12-11 NOTE — Progress Notes (Signed)
ASSESSMENT & PLAN   NEUROPATHY: I think her symptoms in her lower extremities are secondary to neuropathy.  She has palpable pedal pulses, normal Doppler signals, and normal ABIs and toe pressures.  Likewise I do not think she has any significant evidence of venous disease.  If her symptoms progress she could be considered for Neurontin.  PROMINENT AORTIC PULSE:  Her infrarenal aorta seems slightly enlarged to me on exam.  She has no history of aneurysmal disease in her family.  She does have a history of tobacco use.  She had not fasted and had some concerns about her insurance covering the study so we were unable to get an aortic duplex today.  However she would like to discuss this with her nurse practitioner and I would recommend that she gets a screening study for abdominal aortic aneurysm.  REASON FOR CONSULT:    Neuropathy.  The consult is requested by Jeralyn Ruths, NP.  HPI:   Brandi Stevens is a 66 y.o. female who has been having some numbness in her feet.  He is felt that she likely has neuropathy but she was sent for vascular evaluation to be sure she had no evidence of underlying peripheral arterial disease.  I do not get any history of claudication, rest pain, or nonhealing ulcers.  Her only risk factor for peripheral arterial disease is tobacco use.  She quit in 2013 and then restarted and then she quit again in 2020.  She denies any history of diabetes, hypertension, hypercholesterolemia, family history of premature cardiovascular disease.  She has no family history of aneurysmal disease.  She does get cramps in her feet at night.  Her symptoms in her feet are described as tingling and paresthesias.  She also states that her feet get cold.  She tells me she does have a history of Raynaud's.  Past Medical History:  Diagnosis Date   Arthritis    Bilateral shoulders.   Bradycardia    Exercise stress test normal 2008   Diverticulitis 2012   Emphysema    Last PFTs 1980s, not on  current inhaler thearpy   Fibroadenoma of breast 01/2012   R breast   Graves disease 1980s   RAI in 2008, not on any thyroid replacement hormone   Osteoarthritis of shoulder    Bilateral arthritis/bursitis   Rotator cuff tear arthropathy    R cuff   Seasonal allergies    SVD (spontaneous vaginal delivery)    x 3   Tubular adenoma of colon 01/2015    Family History  Problem Relation Age of Onset   Colon polyps Mother 62   Pancreatic cancer Mother 40       pancreatic   Hearing loss Father    Stroke Father 39       ischemic stroke   Prostate cancer Brother 53       with mets   Gastric cancer Maternal Aunt        gastric   Colon polyps Maternal Grandfather 65   Colon cancer Maternal Grandfather 65   Rectal cancer Neg Hx    Stomach cancer Neg Hx    Esophageal cancer Neg Hx     SOCIAL HISTORY: Social History   Tobacco Use   Smoking status: Former    Packs/day: 1.00    Years: 20.00    Total pack years: 20.00    Types: Cigarettes    Start date: 11/08/2011   Smokeless tobacco: Never  Substance Use Topics  Alcohol use: No    Alcohol/week: 0.0 standard drinks of alcohol    Allergies  Allergen Reactions   Boniva [Ibandronic Acid] Diarrhea and Nausea And Vomiting   Latex Anaphylaxis and Rash    Latex gloves/ skin itch    Sulfa Antibiotics Shortness Of Breath, Itching and Anaphylaxis    Also, wheezing    Current Outpatient Medications  Medication Sig Dispense Refill   acetaminophen (TYLENOL) 650 MG CR tablet Take 650 mg by mouth every 8 (eight) hours as needed for pain.     cetirizine (ZYRTEC) 10 MG chewable tablet Chew 10 mg by mouth daily as needed.     cholecalciferol (VITAMIN D) 1000 UNITS tablet Take 5,000 Units by mouth 3 (three) times a week.     diclofenac sodium (VOLTAREN) 1 % GEL Apply 2 g topically daily as needed. For shoulder pain per patient 6 Tube 1   fluticasone (FLONASE) 50 MCG/ACT nasal spray Place 1 spray into both nostrils daily. 16 g 5    gabapentin (NEURONTIN) 100 MG capsule Take 1 capsule (100 mg total) by mouth at bedtime. 30 capsule 2   linaclotide (LINZESS) 145 MCG CAPS capsule Take 1 capsule (145 mcg total) by mouth daily as needed. 30 capsule 2   Multiple Vitamins-Minerals (CENTRUM SILVER PO) Take 1 tablet by mouth daily.     olopatadine (PATANOL) 0.1 % ophthalmic solution Place 1 drop into both eyes 2 (two) times daily. 5 mL 5   polyethylene glycol powder (GLYCOLAX/MIRALAX) 17 GM/SCOOP powder Take 17 g by mouth 2 (two) times daily as needed. 225 g 5   No current facility-administered medications for this visit.    REVIEW OF SYSTEMS:  '[X]'$  denotes positive finding, '[ ]'$  denotes negative finding Cardiac  Comments:  Chest pain or chest pressure:    Shortness of breath upon exertion:    Short of breath when lying flat:    Irregular heart rhythm:        Vascular    Pain in calf, thigh, or hip brought on by ambulation:    Pain in feet at night that wakes you up from your sleep:  x   Blood clot in your veins:    Leg swelling:         Pulmonary    Oxygen at home:    Productive cough:     Wheezing:         Neurologic    Sudden weakness in arms or legs:     Sudden numbness in arms or legs:     Sudden onset of difficulty speaking or slurred speech:    Temporary loss of vision in one eye:     Problems with dizziness:         Gastrointestinal    Blood in stool:     Vomited blood:         Genitourinary    Burning when urinating:     Blood in urine:        Psychiatric    Major depression:         Hematologic    Bleeding problems:    Problems with blood clotting too easily:        Skin    Rashes or ulcers:        Constitutional    Fever or chills:    -  PHYSICAL EXAM:   Vitals:   12/11/21 0834  BP: 133/82  Pulse: (!) 53  Resp: 20  Temp: 98.1 F (36.7 C)  SpO2: 98%  Weight: 138 lb (62.6 kg)  Height: '5\' 5"'$  (1.651 m)   Body mass index is 22.96 kg/m. GENERAL: The patient is a well-nourished  female, in no acute distress. The vital signs are documented above. CARDIAC: There is a regular rate and rhythm.  VASCULAR: I do not detect carotid bruits. She has palpable femoral, popliteal, and dorsalis pedis pulses bilaterally. She has no significant lower extremity swelling. She has some small telangiectasias bilaterally. PULMONARY: There is good air exchange bilaterally without wheezing or rales. ABDOMEN: Soft and non-tender with normal pitched bowel sounds.  Her aorta seems slightly enlarged to me. MUSCULOSKELETAL: There are no major deformities. NEUROLOGIC: No focal weakness or paresthesias are detected. SKIN: There are no ulcers or rashes noted. PSYCHIATRIC: The patient has a normal affect.  DATA:    ARTERIAL DOPPLER STUDY: I have independently interpreted her arterial Doppler study today.  On the right side there is a biphasic posterior tibial and dorsalis pedis signal.  ABIs 100%.  Toe pressure is 93 mmHg. On the left side there is a biphasic posterior tibial and dorsalis pedis signal.  ABI is 99%.  Toe pressure is 92 mmHg.   Deitra Mayo [facility]

## 2021-12-15 ENCOUNTER — Ambulatory Visit
Admission: RE | Admit: 2021-12-15 | Discharge: 2021-12-15 | Disposition: A | Discharge status: Home / Self Care | Payer: PPO | Source: Ambulatory Visit | Attending: Nurse Practitioner | Admitting: Nurse Practitioner

## 2021-12-15 DIAGNOSIS — Z87891 Personal history of nicotine dependence: Secondary | ICD-10-CM | POA: Diagnosis not present

## 2021-12-15 DIAGNOSIS — Z122 Encounter for screening for malignant neoplasm of respiratory organs: Secondary | ICD-10-CM

## 2021-12-30 DIAGNOSIS — E0500 Thyrotoxicosis with diffuse goiter without thyrotoxic crisis or storm: Secondary | ICD-10-CM | POA: Diagnosis not present

## 2021-12-30 DIAGNOSIS — H524 Presbyopia: Secondary | ICD-10-CM | POA: Diagnosis not present

## 2021-12-30 DIAGNOSIS — H2513 Age-related nuclear cataract, bilateral: Secondary | ICD-10-CM | POA: Diagnosis not present

## 2021-12-30 DIAGNOSIS — H35371 Puckering of macula, right eye: Secondary | ICD-10-CM | POA: Diagnosis not present

## 2022-01-01 ENCOUNTER — Ambulatory Visit (INDEPENDENT_AMBULATORY_CARE_PROVIDER_SITE_OTHER): Payer: PPO | Admitting: Nurse Practitioner

## 2022-01-01 ENCOUNTER — Encounter: Payer: Self-pay | Admitting: Nurse Practitioner

## 2022-01-01 VITALS — BP 132/68 | HR 68 | Temp 98.2°F | Ht 65.0 in | Wt 136.2 lb

## 2022-01-01 DIAGNOSIS — E0500 Thyrotoxicosis with diffuse goiter without thyrotoxic crisis or storm: Secondary | ICD-10-CM | POA: Diagnosis not present

## 2022-01-01 DIAGNOSIS — J309 Allergic rhinitis, unspecified: Secondary | ICD-10-CM

## 2022-01-01 DIAGNOSIS — Z8249 Family history of ischemic heart disease and other diseases of the circulatory system: Secondary | ICD-10-CM

## 2022-01-01 DIAGNOSIS — Z136 Encounter for screening for cardiovascular disorders: Secondary | ICD-10-CM | POA: Insufficient documentation

## 2022-01-01 DIAGNOSIS — H1013 Acute atopic conjunctivitis, bilateral: Secondary | ICD-10-CM

## 2022-01-01 DIAGNOSIS — R7303 Prediabetes: Secondary | ICD-10-CM | POA: Diagnosis not present

## 2022-01-01 DIAGNOSIS — Z87891 Personal history of nicotine dependence: Secondary | ICD-10-CM | POA: Diagnosis not present

## 2022-01-01 DIAGNOSIS — E559 Vitamin D deficiency, unspecified: Secondary | ICD-10-CM

## 2022-01-01 NOTE — Assessment & Plan Note (Signed)
Chronic, thyroid panel within normal limits.  Continue to monitor every 6 months.

## 2022-01-01 NOTE — Assessment & Plan Note (Signed)
Chronic, serum vitamin D3 level now within normal limits.  Patient to continue taking 5000 IUs of vitamin D3 3 times a week.

## 2022-01-01 NOTE — Assessment & Plan Note (Signed)
Based on her history of smoking and family history of aneurysms I do think it is reasonable for her to undergo screening of abdominal aortic aneurysm.  Ultrasound ordered, further recommendations may be made based upon these results.

## 2022-01-01 NOTE — Progress Notes (Signed)
Established Patient Office Visit  Subjective   Patient ID: Brandi Stevens, female    DOB: 07/14/1955  Age: 66 y.o. MRN: [id number]  Chief Complaint  Patient presents with   Prediabetes   Prediabetes: A1c remains stable at 6.0. Vitamin D deficiency: She continues on 5000 IUs of vitamin D3 3 times a week, last office visit serum vitamin D3 was checked and it is now 32.75 (improvement from 19) Dry/allergic rhinitis: Continues on olopatadine eyedrops, Flonase, and Zyrtec, now seeing TED specialist related to her history of Graves' disease. Graves' disease: Has completed iodine treatment back in 2008, not currently on thyroid supplementation.  Last thyroid panel within normal limits. Former smoker: Patient has a 50+ pack year, quit smoking about 4 years ago.  Completed lung cancer screening with CT scan.  No signs of cancer noted, due in 1 year.  She does have prominent pulse noted in her abdomen, no bruit noted.  Does have family history of 2 cousins who had brain aneurysms. Leukopenia: Had mild leukopenia noted in the past.  Last blood work showed normal blood cell counts.     Review of Systems  Constitutional:  Negative for fever and malaise/fatigue.  Respiratory:  Negative for shortness of breath.   Cardiovascular:  Negative for chest pain.      Objective:     BP 132/68   Pulse 68   Temp 98.2 F (36.8 C) (Oral)   Ht '5\' 5"'$  (1.651 m)   Wt 136 lb 4 oz (61.8 kg)   SpO2 99%   BMI 22.67 kg/m    Physical Exam Vitals reviewed.  Constitutional:      General: She is not in acute distress.    Appearance: Normal appearance.  HENT:     Head: Normocephalic and atraumatic.  Neck:     Vascular: No carotid bruit.  Cardiovascular:     Rate and Rhythm: Normal rate and regular rhythm.     Pulses: Normal pulses.     Heart sounds: Normal heart sounds.  Pulmonary:     Effort: Pulmonary effort is normal.     Breath sounds: Normal breath sounds.  Abdominal:    Skin:    General:  Skin is warm and dry.  Neurological:     General: No focal deficit present.     Mental Status: She is alert and oriented to person, place, and time.  Psychiatric:        Mood and Affect: Mood normal.        Behavior: Behavior normal.        Judgment: Judgment normal.      No results found for any visits on 01/01/22.    The 10-year ASCVD risk score (Arnett DK, et al., 2019) is: 7.8%    Assessment & Plan:   Problem List Items Addressed This Visit       Respiratory   Allergic conjunctivitis and rhinitis    Chronic, patient continue Zyrtec, Flonase nasal spray, and olopatadine eyedrops.  Patient to continue following up with TED specialist every 6 months.        Endocrine   Graves disease    Chronic, thyroid panel within normal limits.  Continue to monitor every 6 months.        Other   Vitamin D deficiency    Chronic, serum vitamin D3 level now within normal limits.  Patient to continue taking 5000 IUs of vitamin D3 3 times a week.      Prediabetes  Chronic, A1c stable.  Patient to continue living healthy lifestyle aimed at maintaining A1c in the prediabetic range or lower.      Encounter for abdominal aortic aneurysm (AAA) screening - Primary    Based on her history of smoking and family history of aneurysms I do think it is reasonable for her to undergo screening of abdominal aortic aneurysm.  Ultrasound ordered, further recommendations may be made based upon these results.      Relevant Orders   [place] [employer]A MEDICARE SCREENING   Other Visit Diagnoses     Family history of brain aneurysm       Relevant Orders   [place] MEDICARE SCREENING   Former smoker       Relevant Orders   [place]A MEDICARE SCREENING       Return in about 6 months (around 07/03/2022) for f/u with Judson Roch.    Ailene Ards, NP

## 2022-01-01 NOTE — Assessment & Plan Note (Signed)
Chronic, patient continue Zyrtec, Flonase nasal spray, and olopatadine eyedrops.  Patient to continue following up with TED specialist every 6 months.

## 2022-01-01 NOTE — Assessment & Plan Note (Signed)
Chronic, A1c stable.  Patient to continue living healthy lifestyle aimed at maintaining A1c in the prediabetic range or lower.

## 2022-01-16 ENCOUNTER — Other Ambulatory Visit: Payer: Self-pay | Admitting: Nurse Practitioner

## 2022-01-16 ENCOUNTER — Ambulatory Visit
Admission: RE | Admit: 2022-01-16 | Discharge: 2022-01-16 | Disposition: A | Discharge status: Home / Self Care | Payer: PPO | Source: Ambulatory Visit | Attending: Nurse Practitioner | Admitting: Nurse Practitioner

## 2022-01-16 ENCOUNTER — Telehealth (INDEPENDENT_AMBULATORY_CARE_PROVIDER_SITE_OTHER): Payer: PPO | Admitting: Nurse Practitioner

## 2022-01-16 DIAGNOSIS — Z87891 Personal history of nicotine dependence: Secondary | ICD-10-CM

## 2022-01-16 DIAGNOSIS — Z136 Encounter for screening for cardiovascular disorders: Secondary | ICD-10-CM

## 2022-01-16 DIAGNOSIS — R0989 Other specified symptoms and signs involving the circulatory and respiratory systems: Secondary | ICD-10-CM

## 2022-01-16 DIAGNOSIS — E559 Vitamin D deficiency, unspecified: Secondary | ICD-10-CM

## 2022-01-16 DIAGNOSIS — Z8249 Family history of ischemic heart disease and other diseases of the circulatory system: Secondary | ICD-10-CM

## 2022-01-16 DIAGNOSIS — H1013 Acute atopic conjunctivitis, bilateral: Secondary | ICD-10-CM

## 2022-01-16 DIAGNOSIS — R051 Acute cough: Secondary | ICD-10-CM

## 2022-01-16 DIAGNOSIS — R7303 Prediabetes: Secondary | ICD-10-CM

## 2022-01-16 DIAGNOSIS — E0500 Thyrotoxicosis with diffuse goiter without thyrotoxic crisis or storm: Secondary | ICD-10-CM

## 2022-01-16 DIAGNOSIS — I77811 Abdominal aortic ectasia: Secondary | ICD-10-CM | POA: Diagnosis not present

## 2022-01-16 MED ORDER — ALBUTEROL SULFATE HFA 108 (90 BASE) MCG/ACT IN AERS: 2.0000 | INHALATION_SPRAY | Freq: Four times a day (QID) | RESPIRATORY_TRACT | 0 refills | Status: DC | PRN

## 2022-01-16 MED ORDER — AMOXICILLIN-POT CLAVULANATE 875-125 MG PO TABS: 1.0000 | ORAL_TABLET | Freq: Two times a day (BID) | ORAL | 0 refills | Status: DC

## 2022-01-16 NOTE — Addendum Note (Signed)
Addended by: Ailene Ards on: 01/16/2022 08:52 AM   Modules accepted: Orders

## 2022-01-16 NOTE — Assessment & Plan Note (Signed)
Acute, has been present for almost 2 4 weeks.  We will treat with course of Augmentin, patient continues Mucinex over-the-counter, and albuterol inhaler as needed for wheezing or shortness of breath.  Patient encouraged to call office if symptoms persist or worsen.  She reports understanding.

## 2022-01-16 NOTE — Progress Notes (Signed)
   Established Patient Office Visit  An audio-only tele-health visit was completed today for this patient. I connected with  Brandi Stevens on 01/16/22 utilizing audio-only technology and verified that I am speaking with the correct person using two identifiers. The patient was located at their work, and I was located at the office of Sales promotion account executive [facility] at [facility] during the encounter. I discussed the limitations of evaluation and management by telemedicine. The patient expressed understanding and agreed to proceed.     Subjective   Patient ID: Brandi Stevens, female    DOB: 1955-04-20  Age: 66 y.o. MRN: [id number]  Chief Complaint  Patient presents with   Cough    Symptom that 12 days ago.  Patient is experiencing cough and sputum production.  She was experiencing chills initially when symptoms first started but they have now resolved.  She does get short of breath only with coughing fits.  Is taking Mucinex with decongestant over-the-counter for symptom management.  Does feel that she is wheezing at times.  Has tested for COVID-19 infection at home x2 both of which have been negative.    Review of Systems  Constitutional:  Negative for chills (early in course; resolved now) and fever.  Respiratory:  Positive for cough and sputum production. Negative for shortness of breath.   Cardiovascular:  Negative for chest pain.  Gastrointestinal:  Negative for diarrhea, nausea and vomiting.      Objective:     There were no vitals taken for this visit. BP Readings from Last 3 Encounters:  01/01/22 132/68  12/11/21 133/82  11/20/21 110/64   Wt Readings from Last 3 Encounters:  01/01/22 136 lb 4 oz (61.8 kg)  12/11/21 138 lb (62.6 kg)  11/20/21 137 lb 8 oz (62.4 kg)      Physical Exam Comprehensive physical exam not completed today as office visit was conducted remotely.  Patient sounds well over the phone she does cough multiple times during visit today..  Patient was alert  and oriented, and appeared to have appropriate judgment.   No results found for any visits on 01/16/22.    The 10-year ASCVD risk score (Arnett DK, et al., 2019) is: 7.8%    Assessment & Plan:   Problem List Items Addressed This Visit       Other   Acute cough - Primary    Acute, has been present for almost 2 4 weeks.  We will treat with course of Augmentin, patient continues Mucinex over-the-counter, and albuterol inhaler as needed for wheezing or shortness of breath.  Patient encouraged to call office if symptoms persist or worsen.  She reports understanding.      Relevant Medications   amoxicillin-clavulanate (AUGMENTIN) 875-125 MG tablet   albuterol (VENTOLIN HFA) 108 (90 Base) MCG/ACT inhaler    Return if symptoms worsen or fail to improve.    Ailene Ards, NP total time spent with telephone was 5 minutes and 30 seconds.

## 2022-04-29 ENCOUNTER — Telehealth: Payer: Self-pay

## 2022-04-29 NOTE — Telephone Encounter (Signed)
Contacted [name] to schedule their annual wellness visit. Appointment made for 05/13/22.  Norton Blizzard, [place] ([facility])  [place] [facility] [phone]

## 2022-05-13 ENCOUNTER — Ambulatory Visit (INDEPENDENT_AMBULATORY_CARE_PROVIDER_SITE_OTHER): Payer: PPO

## 2022-05-13 VITALS — Ht 65.0 in | Wt 132.0 lb

## 2022-05-13 DIAGNOSIS — Z0000 Encounter for general adult medical examination without abnormal findings: Secondary | ICD-10-CM

## 2022-05-13 NOTE — Patient Instructions (Addendum)
Brandi Stevens , Thank you for taking time to come for your Medicare Wellness Visit. I appreciate your ongoing commitment to your health goals. Please review the following plan we discussed and let me know if I can assist you in the future.   These are the goals we discussed:  Goals      My goal for 2024 is to stay physicall and mentally healthy, focused and enjoy life.        This is a list of the screening recommended for you and due dates:  Health Maintenance  Topic Date Due   COVID-19 Vaccine (6 - 2023-24 season) 02/19/2022   DTaP/Tdap/Td vaccine (2 - Td or Tdap) 03/09/2022   Screening for Lung Cancer  12/16/2022   Medicare Annual Wellness Visit  05/13/2023   Mammogram  06/13/2023   Colon Cancer Screening  02/21/2024   Pneumonia Vaccine  Completed   Flu Shot  Completed   DEXA scan (bone density measurement)  Completed   Hepatitis C Screening: USPSTF Recommendation to screen - Ages 64-79 yo.  Completed   HIV Screening  Completed   HPV Vaccine  Aged Out   Zoster (Shingles) Vaccine  Discontinued    Advanced directives: No  Conditions/risks identified: Yes  Next appointment: Follow up in one year for your annual wellness visit.   Preventive Care 10 Years and Older, Female Preventive care refers to lifestyle choices and visits with your health care provider that can promote health and wellness. What does preventive care include? A yearly physical exam. This is also called an annual well check. Dental exams once or twice a year. Routine eye exams. Ask your health care provider how often you should have your eyes checked. Personal lifestyle choices, including: Daily care of your teeth and gums. Regular physical activity. Eating a healthy diet. Avoiding tobacco and drug use. Limiting alcohol use. Practicing safe sex. Taking low-dose aspirin every day. Taking vitamin and mineral supplements as recommended by your health care provider. What happens during an annual well  check? The services and screenings done by your health care provider during your annual well check will depend on your age, overall health, lifestyle risk factors, and family history of disease. Counseling  Your health care provider may ask you questions about your: Alcohol use. Tobacco use. Drug use. Emotional well-being. Home and relationship well-being. Sexual activity. Eating habits. History of falls. Memory and ability to understand (cognition). Work and work Statistician. Reproductive health. Screening  You may have the following tests or measurements: Height, weight, and BMI. Blood pressure. Lipid and cholesterol levels. These may be checked every 5 years, or more frequently if you are over 56 years old. Skin check. Lung cancer screening. You may have this screening every year starting at age 88 if you have a 30-pack-year history of smoking and currently smoke or have quit within the past 15 years. Fecal occult blood test (FOBT) of the stool. You may have this test every year starting at age 73. Flexible sigmoidoscopy or colonoscopy. You may have a sigmoidoscopy every 5 years or a colonoscopy every 10 years starting at age 32. Hepatitis C blood test. Hepatitis B blood test. Sexually transmitted disease (STD) testing. Diabetes screening. This is done by checking your blood sugar (glucose) after you have not eaten for a while (fasting). You may have this done every 1-3 years. Bone density scan. This is done to screen for osteoporosis. You may have this done starting at age 51. Mammogram. This may be done every  1-2 years. Talk to your health care provider about how often you should have regular mammograms. Talk with your health care provider about your test results, treatment options, and if necessary, the need for more tests. Vaccines  Your health care provider may recommend certain vaccines, such as: Influenza vaccine. This is recommended every year. Tetanus, diphtheria, and  acellular pertussis (Tdap, Td) vaccine. You may need a Td booster every 10 years. Zoster vaccine. You may need this after age 40. Pneumococcal 13-valent conjugate (PCV13) vaccine. One dose is recommended after age 3. Pneumococcal polysaccharide (PPSV23) vaccine. One dose is recommended after age 14. Talk to your health care provider about which screenings and vaccines you need and how often you need them. This information is not intended to replace advice given to you by your health care provider. Make sure you discuss any questions you have with your health care provider. Document Released: 03/22/2015 Document Revised: 11/13/2015 Document Reviewed: 12/25/2014 Elsevier Interactive Patient Education  2017 [place] Prevention in the Home Falls can cause injuries. They can happen to people of all ages. There are many things you can do to make your home safe and to help prevent falls. What can I do on the outside of my home? Regularly fix the edges of walkways and driveways and fix any cracks. Remove anything that might make you trip as you walk through a door, such as a raised step or threshold. Trim any bushes or trees on the path to your home. Use bright outdoor lighting. Clear any walking paths of anything that might make someone trip, such as rocks or tools. Regularly check to see if handrails are loose or broken. Make sure that both sides of any steps have handrails. Any raised decks and porches should have guardrails on the edges. Have any leaves, snow, or ice cleared regularly. Use sand or salt on walking paths during winter. Clean up any spills in your garage right away. This includes oil or grease spills. What can I do in the bathroom? Use night lights. Install grab bars by the toilet and in the tub and shower. Do not use towel bars as grab bars. Use non-skid mats or decals in the tub or shower. If you need to sit down in the shower, use a plastic, non-slip stool. Keep  the floor dry. Clean up any water that spills on the floor as soon as it happens. Remove soap buildup in the tub or shower regularly. Attach bath mats securely with double-sided non-slip rug tape. Do not have throw rugs and other things on the floor that can make you trip. What can I do in the bedroom? Use night lights. Make sure that you have a light by your bed that is easy to reach. Do not use any sheets or blankets that are too big for your bed. They should not hang down onto the floor. Have a firm chair that has side arms. You can use this for support while you get dressed. Do not have throw rugs and other things on the floor that can make you trip. What can I do in the kitchen? Clean up any spills right away. Avoid walking on wet floors. Keep items that you use a lot in easy-to-reach places. If you need to reach something above you, use a strong step stool that has a grab bar. Keep electrical cords out of the way. Do not use floor polish or wax that makes floors slippery. If you must use wax, use non-skid  floor wax. Do not have throw rugs and other things on the floor that can make you trip. What can I do with my stairs? Do not leave any items on the stairs. Make sure that there are handrails on both sides of the stairs and use them. Fix handrails that are broken or loose. Make sure that handrails are as long as the stairways. Check any carpeting to make sure that it is firmly attached to the stairs. Fix any carpet that is loose or worn. Avoid having throw rugs at the top or bottom of the stairs. If you do have throw rugs, attach them to the floor with carpet tape. Make sure that you have a light switch at the top of the stairs and the bottom of the stairs. If you do not have them, ask someone to add them for you. What else can I do to help prevent falls? Wear shoes that: Do not have high heels. Have rubber bottoms. Are comfortable and fit you well. Are closed at the toe. Do not  wear sandals. If you use a stepladder: Make sure that it is fully opened. Do not climb a closed stepladder. Make sure that both sides of the stepladder are locked into place. Ask someone to hold it for you, if possible. Clearly mark and make sure that you can see: Any grab bars or handrails. First and last steps. Where the edge of each step is. Use tools that help you move around (mobility aids) if they are needed. These include: Canes. Walkers. Scooters. Crutches. Turn on the lights when you go into a dark area. Replace any light bulbs as soon as they burn out. Set up your furniture so you have a clear path. Avoid moving your furniture around. If any of your floors are uneven, fix them. If there are any pets around you, be aware of where they are. Review your medicines with your doctor. Some medicines can make you feel dizzy. This can increase your chance of falling. Ask your doctor what other things that you can do to help prevent falls. This information is not intended to replace advice given to you by your health care provider. Make sure you discuss any questions you have with your health care provider. Document Released: 12/20/2008 Document Revised: 08/01/2015 Document Reviewed: 03/30/2014 Elsevier Interactive Patient Education  2017 [employer].

## 2022-05-13 NOTE — Progress Notes (Signed)
I connected with  [name] on 05/13/22 by a audio enabled telemedicine application and verified that I am speaking with the correct person using two identifiers.  Patient Location: Home  Provider Location: Office/Clinic  I discussed the limitations of evaluation and management by telemedicine. The patient expressed understanding and agreed to proceed.  Subjective:   Brandi Stevens is a 67 y.o. female who presents for an Initial Medicare Annual Wellness Visit.  Review of Systems     Cardiac Risk Factors include: advanced age (>16mn, >>4women);family history of premature cardiovascular disease     Objective:    Today's Vitals   05/13/22 1402  Weight: 132 lb (59.9 kg)  Height: '5\' 5"'$  (1.651 m)  PainSc: 0-No pain   Body mass index is 21.97 kg/m.     05/13/2022    2:06 PM 11/01/2013    4:05 PM 10/12/2013    3:53 PM 12/16/2011    8:27 AM  Advanced Directives  Does Patient Have a Medical Advance Directive? No No Patient does not have advance directive;Patient would not like information   Does patient want to make changes to medical advance directive?  Yes - information given    Would patient like information on creating a medical advance directive? No - Patient declined     Pre-existing out of facility DNR order (yellow form or pink MOST form)    No    Current Medications (verified) Outpatient Encounter Medications as of 05/13/2022  Medication Sig   acetaminophen (TYLENOL) 650 MG CR tablet Take 650 mg by mouth every 8 (eight) hours as needed for pain.   cetirizine (ZYRTEC) 10 MG chewable tablet Chew 10 mg by mouth daily as needed.   cholecalciferol (VITAMIN D) 1000 UNITS tablet Take 5,000 Units by mouth 3 (three) times a week.   diclofenac sodium (VOLTAREN) 1 % GEL Apply 2 g topically daily as needed. For shoulder pain per patient   fluticasone (FLONASE) 50 MCG/ACT nasal spray Place 1 spray into both nostrils daily.   gabapentin (NEURONTIN) 100 MG capsule Take 1 capsule  (100 mg total) by mouth at bedtime.   linaclotide (LINZESS) 145 MCG CAPS capsule Take 1 capsule (145 mcg total) by mouth daily as needed.   Multiple Vitamins-Minerals (CENTRUM SILVER PO) Take 1 tablet by mouth daily.   olopatadine (PATANOL) 0.1 % ophthalmic solution Place 1 drop into both eyes 2 (two) times daily.   polyethylene glycol powder (GLYCOLAX/MIRALAX) 17 GM/SCOOP powder Take 17 g by mouth 2 (two) times daily as needed.   albuterol (VENTOLIN HFA) 108 (90 Base) MCG/ACT inhaler Inhale 2 puffs into the lungs every 6 (six) hours as needed for wheezing or shortness of breath. (Patient not taking: Reported on 05/13/2022)   amoxicillin-clavulanate (AUGMENTIN) 875-125 MG tablet Take 1 tablet by mouth 2 (two) times daily. (Patient not taking: Reported on 05/13/2022)   No facility-administered encounter medications on file as of 05/13/2022.    Allergies (verified) Boniva [ibandronic acid], Latex, and Sulfa antibiotics   History: Past Medical History:  Diagnosis Date   Arthritis    Bilateral shoulders.   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   R breast   Graves disease 1980s   RAI in 2008, not on any thyroid replacement hormone   Neutropenia (HClements 05/16/2021   Osteoarthritis of shoulder    Bilateral arthritis/bursitis  Rotator cuff tear arthropathy    R cuff   Seasonal allergies    SVD (spontaneous vaginal delivery)    x 3   Tubular adenoma of colon 01/2015   Past Surgical History:  Procedure Laterality Date   ABDOMINAL HYSTERECTOMY  1989   BREAST BIOPSY  12/22/2011   Procedure: BREAST BIOPSY;  Surgeon: Ralene Ok, MD;  Location: [facility];  Service: General;  Laterality: Right;  Excision right breast mass   BREAST EXCISIONAL BIOPSY     CHOLECYSTECTOMY  1989   COLONOSCOPY  01/2015   hx polyps-[facility]   COLONOSCOPY  2019   MS-MAC-prep good (goly)-TA-  repeat 3 yrs   EYE SURGERY Left    MULTIPLE TOOTH EXTRACTIONS     POLYPECTOMY  2019   TA   ROTATOR CUFF REPAIR     R shoulder x2 Dr. Kayleen Memos at [place] Right    SHOULDER ARTHROSCOPY W/ SUPERIOR LABRAL ANTERIOR POSTERIOR LESION REPAIR Right    bursitis   TUBAL LIGATION     Family History  Problem Relation Age of Onset   Colon polyps Mother 90   Pancreatic cancer Mother 89       pancreatic   Hearing loss Father    Stroke Father 70       ischemic stroke   Prostate cancer Brother 42       with mets   Gastric cancer Maternal Aunt        gastric   Colon polyps Maternal Grandfather 62   Colon cancer Maternal Grandfather 65   Rectal cancer Neg Hx    Stomach cancer Neg Hx    Esophageal cancer Neg Hx    Social History   Socioeconomic History   Marital status: Divorced    Spouse name: Not on file   Number of children: 3   Years of education: Not on file   Highest education level: Not on file  Occupational History   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 Sons nearby.    Social Determinants of Health   Financial Resource Strain: Low Risk  (05/13/2022)   Overall Financial Resource Strain (CARDIA)    Difficulty of Paying Living Expenses: Not hard at all  Food Insecurity: No Food Insecurity (05/13/2022)   Hunger Vital Sign    Worried About Running Out of Food in the Last Year: Never true    Ran Out of Food in the Last Year: Never true   Transportation Needs: No Transportation Needs (05/13/2022)   PRAPARE - Hydrologist (Medical): No    Lack of Transportation (Non-Medical): No  Physical Activity: Sufficiently Active (05/13/2022)   Exercise Vital Sign    Days of Exercise per Week: 7 days    Minutes of Exercise per Session: 60 min  Stress: No Stress Concern Present (05/13/2022)  [employer] - Occupational Stress Questionnaire    Feeling of Stress : Not at all  Social Connections: Moderately Integrated (05/13/2022)   Social Connection and Isolation Panel [NHANES]    Frequency of Communication with Friends and Family: More than three times a week    Frequency of Social Gatherings with Friends and Family: More than three times a week    Attends Religious Services: More than 4 times per year    Active Member of [employer] or Organizations: Yes    Attends Music therapist: More than 4 times per year    Marital Status: Divorced    Tobacco Counseling Counseling given: Not Answered   Clinical Intake:  Pre-visit preparation completed: Yes  Pain : No/denies pain Pain Score: 0-No pain     BMI - recorded: 21.97 Nutritional Status: BMI of 19-24  Normal Nutritional Risks: None Diabetes: No  How often do you need to have someone help you when you read instructions, pamphlets, or other written materials from your doctor or pharmacy?: 1 - Never What is the last grade level you completed in school?: HSG; 2 [place]  Diabetic? No  Interpreter Needed?: No  Information entered by :: Lisette Abu, LPN.   Activities of Daily Living    05/13/2022    2:11 PM  In your present state of health, do you have any difficulty performing the following activities:  Hearing? 0  Vision? 0  Difficulty concentrating or making decisions? 0  Walking or climbing stairs? 0  Dressing or bathing? 0  Doing errands, shopping? 0  Preparing Food and eating ? N  Using the  Toilet? N  In the past six months, have you accidently leaked urine? N  Do you have problems with loss of bowel control? N  Managing your Medications? N  Managing your Finances? N  Housekeeping or managing your Housekeeping? N    Patient Care Team: Ailene Ards, NP as PCP - General (Nurse Practitioner) Rutherford Guys, MD as Consulting Physician (Ophthalmology) Feliz Beam, MD as Referring Physician (Ophthalmology)  Indicate any recent Medical Services you may have received from other than [facility] providers in the past year (date may be approximate).     Assessment:   This is a routine wellness examination for Nakaya.  Hearing/Vision screen Hearing Screening - Comments:: Denies hearing difficulties   Vision Screening - Comments:: Wears rx glasses - up to date with routine eye exams with Rutherford Guys, MD.    Dietary issues and exercise activities discussed: Current Exercise Habits: Home exercise routine, Type of exercise: walking, Time (Minutes): 60, Frequency (Times/Week): 7, Weekly Exercise (Minutes/Week): 420, Intensity: Moderate, Exercise limited by: respiratory conditions(s)   Goals Addressed             This Visit's Progress    My goal for 2024 is to stay physicall and mentally healthy, focused and enjoy life.        Depression Screen    05/13/2022    2:17 PM 01/01/2022    8:06 AM 11/20/2021    8:14 AM 05/16/2021    8:03 AM 04/10/2021    8:05 AM 09/05/2019    9:19 AM 07/12/2019    9:56 AM  PHQ 2/9 Scores  PHQ - 2 Score 0 0 0 0 0 0 0  PHQ- 9 Score 0 0 0        Fall Risk    05/13/2022    2:09 PM 01/01/2022    8:06 AM  11/20/2021    8:14 AM 05/16/2021    8:03 AM 09/05/2019    9:18 AM  Fall Risk   Falls in the past year? 0 0 0 0 0  Number falls in past yr: 0 0 0 0 0  Injury with Fall? 0 0 0 0 0  Risk for fall due to : No Fall Risks No Fall Risks No Fall Risks    Follow up Falls prevention discussed Falls evaluation completed Falls evaluation completed      FALL  RISK PREVENTION PERTAINING TO THE HOME:  Any stairs in or around the home? No  If so, are there any without handrails? No  Home free of loose throw rugs in walkways, pet beds, electrical cords, etc? Yes  Adequate lighting in your home to reduce risk of falls? Yes   ASSISTIVE DEVICES UTILIZED TO PREVENT FALLS:  Life alert? No  Use of a cane, walker or w/c? No  Grab bars in the bathroom? No  Shower chair or bench in shower? Yes  Elevated toilet seat or a handicapped toilet? No   TIMED UP AND GO:  Was the test performed? No . Telephonic Visit  Cognitive Function:        05/13/2022    2:19 PM  6CIT Screen  What Year? 0 points  What month? 0 points  What time? 0 points  Count back from 20 0 points  Months in reverse 0 points  Repeat phrase 0 points  Total Score 0 points    Immunizations Immunization History  Administered Date(s) Administered   Fluad Quad(high Dose 65+) 11/20/2021   Influenza,inj,Quad PF,6+ Mos 11/06/2014, 03/19/2017, 12/16/2017, 11/09/2018   Influenza-Unspecified 11/27/2020, 12/20/2021   Moderna Covid-19 Vaccine Bivalent Booster 66yr & up 12/19/2020, 12/25/2021   PFIZER(Purple Top)SARS-COV-2 Vaccination 05/02/2019, 05/23/2019, 12/24/2019   PNEUMOCOCCAL CONJUGATE-20 11/20/2021   Rsv, Bivalent, Protein Subunit Rsvpref,pf (Evans Lance 12/03/2021   Tdap 03/09/2012    TDAP status: Due, Education has been provided regarding the importance of this vaccine. Advised may receive this vaccine at local pharmacy or Health Dept. Aware to provide a copy of the vaccination record if obtained from local pharmacy or Health Dept. Verbalized acceptance and understanding.  Flu Vaccine status: Up to date  Pneumococcal vaccine status: Up to date  Covid-19 vaccine status: Completed vaccines  Qualifies for Shingles Vaccine? Yes   Zostavax completed No   Shingrix Completed?: No.    Education has been provided regarding the importance of this vaccine. Patient has been advised  to call insurance company to determine out of pocket expense if they have not yet received this vaccine. Advised may also receive vaccine at local pharmacy or Health Dept. Verbalized acceptance and understanding.  Screening Tests Health Maintenance  Topic Date Due   COVID-19 Vaccine (6 - 2023-24 season) 02/19/2022   DTaP/Tdap/Td (2 - Td or Tdap) 03/09/2022   Lung Cancer Screening  12/16/2022   Medicare Annual Wellness (AWV)  05/13/2023   MAMMOGRAM  06/13/2023   COLONOSCOPY (Pts 45-438yrInsurance coverage will need to be confirmed)  02/21/2024   Pneumonia Vaccine 66Years old  Completed   INFLUENZA VACCINE  Completed   DEXA SCAN  Completed   Hepatitis C Screening  Completed   HIV Screening  Completed   HPV VACCINES  Aged Out   Zoster Vaccines- Shingrix  Discontinued    Health Maintenance  Health Maintenance Due  Topic Date Due   COVID-19 Vaccine (6 - 2023-24 season) 02/19/2022   DTaP/Tdap/Td (2 - Td or  Tdap) 03/09/2022    Colorectal cancer screening: Type of screening: Colonoscopy. Completed 02/20/2021. Repeat every 3 years  Mammogram status: Completed 06/12/2021. Repeat every year  Bone Density status: Completed 10/31/2019. Results reflect: Bone density results: OSTEOPOROSIS. Repeat every 2-3 years.  Lung Cancer Screening: (Low Dose CT Chest recommended if Age 42-80 years, 30 pack-year currently smoking OR have quit w/in 15years.) does qualify.   Lung Cancer Screening Referral: last done 12/15/2021; due every year  Additional Screening:  Hepatitis C Screening: does qualify; Completed 11/06/2014  Vision Screening: Recommended annual ophthalmology exams for early detection of glaucoma and other disorders of the eye. Is the patient up to date with their annual eye exam?  Yes  Who is the provider or what is the name of the office in which the patient attends annual eye exams? Dwana Melena, MD. If pt is not established with a provider, would they like to be referred to a provider  to establish care? No .   Dental Screening: Recommended annual dental exams for proper oral hygiene  Community Resource Referral / Chronic Care Management: CRR required this visit?  No   CCM required this visit?  No      Plan:     I have personally reviewed and noted the following in the patient's chart:   Medical and social history Use of alcohol, tobacco or illicit drugs  Current medications and supplements including opioid prescriptions. Patient is not currently taking opioid prescriptions. Functional ability and status Nutritional status Physical activity Advanced directives List of other physicians Hospitalizations, surgeries, and ER visits in previous 12 months Vitals Screenings to include cognitive, depression, and falls Referrals and appointments  In addition, I have reviewed and discussed with patient certain preventive protocols, quality metrics, and best practice recommendations. A written personalized care plan for preventive services as well as general preventive health recommendations were provided to patient.     Sheral Flow, LPN   [id number]   Nurse Notes:  Normal cognitive status assessed by direct observation by this Nurse Health Advisor. No abnormalities found.

## 2022-06-19 ENCOUNTER — Other Ambulatory Visit: Payer: Self-pay | Admitting: Nurse Practitioner

## 2022-06-19 DIAGNOSIS — Z1231 Encounter for screening mammogram for malignant neoplasm of breast: Secondary | ICD-10-CM

## 2022-06-26 DIAGNOSIS — Z882 Allergy status to sulfonamides status: Secondary | ICD-10-CM | POA: Diagnosis not present

## 2022-06-26 DIAGNOSIS — Z809 Family history of malignant neoplasm, unspecified: Secondary | ICD-10-CM | POA: Diagnosis not present

## 2022-06-26 DIAGNOSIS — G8929 Other chronic pain: Secondary | ICD-10-CM | POA: Diagnosis not present

## 2022-06-26 DIAGNOSIS — Z833 Family history of diabetes mellitus: Secondary | ICD-10-CM | POA: Diagnosis not present

## 2022-06-26 DIAGNOSIS — Z91040 Latex allergy status: Secondary | ICD-10-CM | POA: Diagnosis not present

## 2022-07-01 ENCOUNTER — Ambulatory Visit
Admission: RE | Admit: 2022-07-01 | Discharge: 2022-07-01 | Disposition: A | Discharge status: Home / Self Care | Payer: Medicare HMO | Source: Ambulatory Visit | Attending: Nurse Practitioner | Admitting: Nurse Practitioner

## 2022-07-01 DIAGNOSIS — Z1231 Encounter for screening mammogram for malignant neoplasm of breast: Secondary | ICD-10-CM | POA: Diagnosis not present

## 2022-07-03 ENCOUNTER — Ambulatory Visit (INDEPENDENT_AMBULATORY_CARE_PROVIDER_SITE_OTHER): Payer: Medicare HMO | Admitting: Nurse Practitioner

## 2022-07-03 VITALS — BP 104/62 | HR 51 | Temp 98.4°F | Ht 65.0 in | Wt 135.1 lb

## 2022-07-03 DIAGNOSIS — R928 Other abnormal and inconclusive findings on diagnostic imaging of breast: Secondary | ICD-10-CM | POA: Diagnosis not present

## 2022-07-03 DIAGNOSIS — R7303 Prediabetes: Secondary | ICD-10-CM | POA: Diagnosis not present

## 2022-07-03 DIAGNOSIS — H1010 Acute atopic conjunctivitis, unspecified eye: Secondary | ICD-10-CM

## 2022-07-03 DIAGNOSIS — E0500 Thyrotoxicosis with diffuse goiter without thyrotoxic crisis or storm: Secondary | ICD-10-CM

## 2022-07-03 DIAGNOSIS — J309 Allergic rhinitis, unspecified: Secondary | ICD-10-CM | POA: Diagnosis not present

## 2022-07-03 LAB — CBC
HCT: 40.6 % (ref 36.0–46.0)
Hemoglobin: 13.7 g/dL (ref 12.0–15.0)
MCHC: 33.7 g/dL (ref 30.0–36.0)
MCV: 90.8 fl (ref 78.0–100.0)
Platelets: 313 10*3/uL (ref 150.0–400.0)
RBC: 4.47 Mil/uL (ref 3.87–5.11)
RDW: 13.7 % (ref 11.5–15.5)
WBC: 5.4 10*3/uL (ref 4.0–10.5)

## 2022-07-03 LAB — COMPREHENSIVE METABOLIC PANEL
ALT: 7 U/L (ref 0–35)
AST: 13 U/L (ref 0–37)
Albumin: 4.1 g/dL (ref 3.5–5.2)
Alkaline Phosphatase: 79 U/L (ref 39–117)
BUN: 9 mg/dL (ref 6–23)
CO2: 27 mEq/L (ref 19–32)
Calcium: 9.2 mg/dL (ref 8.4–10.5)
Chloride: 105 mEq/L (ref 96–112)
Creatinine, Ser: 0.76 mg/dL (ref 0.40–1.20)
GFR: 81.99 mL/min (ref >60.00)
Glucose, Bld: 95 mg/dL (ref 70–99)
Potassium: 4.3 mEq/L (ref 3.5–5.1)
Sodium: 139 mEq/L (ref 135–145)
Total Bilirubin: 0.3 mg/dL (ref 0.2–1.2)
Total Protein: 7 g/dL (ref 6.0–8.3)

## 2022-07-03 LAB — T4, FREE: Free T4: 0.83 ng/dL (ref 0.60–1.60)

## 2022-07-03 LAB — TSH: TSH: 4.51 u[IU]/mL (ref 0.35–5.50)

## 2022-07-03 LAB — HEMOGLOBIN A1C: Hgb A1c MFr Bld: 6.1 % (ref 4.6–6.5)

## 2022-07-03 MED ORDER — OLOPATADINE HCL 0.2 % OP SOLN: 1.0000 [drp] | Freq: Every morning | OPHTHALMIC | 2 refills | Status: AC

## 2022-07-03 NOTE — Assessment & Plan Note (Signed)
Chronic Acutely worsened due to season change Per shared decision making patient would like to continue Zyrtec, no change of Flonase nasal spray.  Will increase olopatadine dose from 0.1% solution to 0.2% solution, patient told to instill 1 drop per eye every morning.  Patient told to call office if symptoms persist or do not improve. Discussed possibly changing from Zyrtec to Xyzal 2.5 mg at night.  Patient declined this and would prefer to stay on Zyrtec for now.

## 2022-07-03 NOTE — Assessment & Plan Note (Signed)
Chronic Acutely worsened due to season change Per shared decision making patient would like to continue Zyrtec, no change of Flonase nasal spray.  Will increase olopatadine dose from 0.1% solution to 0.2% solution, patient told to instill 1 drop per eye every morning.  Patient told to call office if symptoms persist or do not improve.

## 2022-07-03 NOTE — Assessment & Plan Note (Signed)
Chronic Check A1c today, further recommendations may be made based upon the results

## 2022-07-03 NOTE — Assessment & Plan Note (Signed)
Noted on screening mammogram yesterday Discussed results with patient, she is agreeable to proceed with diagnostic mammogram and ultrasound.  These have been ordered today.

## 2022-07-03 NOTE — Progress Notes (Signed)
Established Patient Office Visit  Subjective   Patient ID: Brandi Stevens, female    DOB: 27-Sep-1955  Age: 67 y.o. MRN: [id number]  Chief Complaint  Patient presents with   Medical Management of Chronic Issues    Follow up, no new concern, wants to talk about allergy wanting something compatible with Patanol     Allergic Rhinitis/Conjunctivitis: Reports her allergic conjunctivitis is much worse recently due to the season change.  Still on Zyrtec 10 mg a day, over-the-counter olopatadine eyedrops, and Flonase nasal spray.  Would like to discuss different treatment options  Prediabetes: Last A1c 6.0 in 11/2021  History of Graves' disease: Not currently on thyroid replacement, completed RAI in 2008.  Routinely checking thyroid panel every 6 months, due for this to be completed today  Abnormal mammogram: Mammogram completed yesterday with noted asymmetry in bilateral breasts.  Recommendation is for patient undergo diagnostic mammogram and ultrasound.    Review of Systems  Respiratory:  Negative for shortness of breath.   Cardiovascular:  Negative for chest pain.  Neurological:  Negative for dizziness and loss of consciousness.      Objective:     BP 104/62   Pulse (!) 51   Temp 98.4 F (36.9 C) (Temporal)   Ht 5\' 5"  (1.651 m)   Wt 135 lb 2 oz (61.3 kg)   SpO2 98%   BMI 22.49 kg/m  BP Readings from Last 3 Encounters:  07/03/22 104/62  01/01/22 132/68  12/11/21 133/82   Wt Readings from Last 3 Encounters:  07/03/22 135 lb 2 oz (61.3 kg)  05/13/22 132 lb (59.9 kg)  01/01/22 136 lb 4 oz (61.8 kg)         07/03/2022    8:13 AM 05/13/2022    2:17 PM 01/01/2022    8:06 AM  PHQ9 SCORE ONLY  PHQ-9 Total Score 0 0 0     Physical Exam Vitals reviewed.  Constitutional:      General: She is not in acute distress.    Appearance: Normal appearance.  HENT:     Head: Normocephalic and atraumatic.  Neck:     Vascular: No carotid bruit.  Cardiovascular:     Rate and  Rhythm: Normal rate and regular rhythm.     Pulses: Normal pulses.     Heart sounds: Normal heart sounds.  Pulmonary:     Effort: Pulmonary effort is normal.     Breath sounds: Normal breath sounds.  Skin:    General: Skin is warm and dry.  Neurological:     General: No focal deficit present.     Mental Status: She is alert and oriented to person, place, and time.  Psychiatric:        Mood and Affect: Mood normal.        Behavior: Behavior normal.        Judgment: Judgment normal.      No results found for any visits on 07/03/22.    The 10-year ASCVD risk score (Arnett DK, et al., 2019) is: 4.5%    Assessment & Plan:   Problem List Items Addressed This Visit       Respiratory   Allergic rhinitis    Chronic Acutely worsened due to season change Per shared decision making patient would like to continue Zyrtec, no change of Flonase nasal spray.  Will increase olopatadine dose from 0.1% solution to 0.2% solution, patient told to instill 1 drop per eye every morning.  Patient told to call office  if symptoms persist or do not improve. Discussed possibly changing from Zyrtec to Xyzal 2.5 mg at night.  Patient declined this and would prefer to stay on Zyrtec for now.        Endocrine   Graves disease    Chronic Check thyroid panel today, further recommendations may be made based on his results      Relevant Orders   TSH   T4, free   Comprehensive metabolic panel   CBC     Other   Prediabetes    Chronic Check A1c today, further recommendations may be made based upon the results      Relevant Orders   Hemoglobin A1c   Comprehensive metabolic panel   CBC   Abnormal mammogram - Primary    Noted on screening mammogram yesterday Discussed results with patient, she is agreeable to proceed with diagnostic mammogram and ultrasound.  These have been ordered today.      Relevant Orders   MM 3D DIAGNOSTIC MAMMOGRAM BILATERAL BREAST   US BREAST COMPLETE UNI LEFT INC  AXILLA   US BREAST COMPLETE UNI RIGHT INC AXILLA   Allergic conjunctivitis    Chronic Acutely worsened due to season change Per shared decision making patient would like to continue Zyrtec, no change of Flonase nasal spray.  Will increase olopatadine dose from 0.1% solution to 0.2% solution, patient told to instill 1 drop per eye every morning.  Patient told to call office if symptoms persist or do not improve.      Relevant Medications   Olopatadine HCl 0.2 % SOLN    Return in about 6 months (around 01/02/2023) for F/U with Landry Lookingbill.    Elenore Paddy, NP

## 2022-07-03 NOTE — Assessment & Plan Note (Signed)
Chronic Check thyroid panel today, further recommendations may be made based on his results

## 2022-07-24 ENCOUNTER — Ambulatory Visit: Payer: PPO

## 2022-07-24 ENCOUNTER — Ambulatory Visit
Admission: RE | Admit: 2022-07-24 | Discharge: 2022-07-24 | Disposition: A | Discharge status: Home / Self Care | Payer: Medicare HMO | Source: Ambulatory Visit | Attending: Nurse Practitioner | Admitting: Nurse Practitioner

## 2022-07-24 DIAGNOSIS — R92343 Mammographic extreme density, bilateral breasts: Secondary | ICD-10-CM | POA: Diagnosis not present

## 2022-07-24 DIAGNOSIS — R928 Other abnormal and inconclusive findings on diagnostic imaging of breast: Secondary | ICD-10-CM

## 2022-07-24 DIAGNOSIS — R922 Inconclusive mammogram: Secondary | ICD-10-CM | POA: Diagnosis not present

## 2022-08-11 DIAGNOSIS — H52203 Unspecified astigmatism, bilateral: Secondary | ICD-10-CM | POA: Diagnosis not present

## 2022-08-11 DIAGNOSIS — H524 Presbyopia: Secondary | ICD-10-CM | POA: Diagnosis not present

## 2022-08-11 DIAGNOSIS — H25813 Combined forms of age-related cataract, bilateral: Secondary | ICD-10-CM | POA: Diagnosis not present

## 2022-08-14 ENCOUNTER — Encounter: Payer: Self-pay | Admitting: Genetic Counselor

## 2022-08-14 NOTE — Progress Notes (Signed)
UPDATE: BRIP1 [id number] VUS has been amended to Likely Benign. Amended report date is 02/24/2022.

## 2022-09-21 DIAGNOSIS — M79672 Pain in left foot: Secondary | ICD-10-CM | POA: Diagnosis not present

## 2022-09-21 DIAGNOSIS — S92912A Unspecified fracture of left toe(s), initial encounter for closed fracture: Secondary | ICD-10-CM | POA: Diagnosis not present

## 2022-09-30 ENCOUNTER — Encounter: Payer: Self-pay | Admitting: Genetic Counselor

## 2022-10-07 DIAGNOSIS — M79672 Pain in left foot: Secondary | ICD-10-CM | POA: Diagnosis not present

## 2022-10-07 DIAGNOSIS — S92515D Nondisplaced fracture of proximal phalanx of left lesser toe(s), subsequent encounter for fracture with routine healing: Secondary | ICD-10-CM | POA: Diagnosis not present

## 2022-10-08 DIAGNOSIS — H02832 Dermatochalasis of right lower eyelid: Secondary | ICD-10-CM | POA: Diagnosis not present

## 2022-10-08 DIAGNOSIS — H02835 Dermatochalasis of left lower eyelid: Secondary | ICD-10-CM | POA: Diagnosis not present

## 2022-10-08 DIAGNOSIS — Q181 Preauricular sinus and cyst: Secondary | ICD-10-CM | POA: Insufficient documentation

## 2022-10-08 DIAGNOSIS — H02834 Dermatochalasis of left upper eyelid: Secondary | ICD-10-CM | POA: Diagnosis not present

## 2022-10-08 DIAGNOSIS — H6123 Impacted cerumen, bilateral: Secondary | ICD-10-CM | POA: Diagnosis not present

## 2022-10-08 DIAGNOSIS — H02831 Dermatochalasis of right upper eyelid: Secondary | ICD-10-CM | POA: Diagnosis not present

## 2022-10-08 HISTORY — DX: Preauricular sinus and cyst: Q18.1

## 2022-11-05 DIAGNOSIS — S92515D Nondisplaced fracture of proximal phalanx of left lesser toe(s), subsequent encounter for fracture with routine healing: Secondary | ICD-10-CM | POA: Diagnosis not present

## 2022-11-05 DIAGNOSIS — M79672 Pain in left foot: Secondary | ICD-10-CM | POA: Diagnosis not present

## 2022-12-03 ENCOUNTER — Telehealth: Payer: Self-pay | Admitting: Nurse Practitioner

## 2022-12-03 NOTE — Telephone Encounter (Signed)
Patient called and said she has a boil by her vaginal area. She said it has been there for 3 days. Patient would like to know if PCP would advise anything. She would like a call back at [phone].

## 2022-12-04 NOTE — Telephone Encounter (Signed)
Called pt and let her be aware that she needs to been seen for, or she can put a warm compress to help with it since we do not have any availability this week, pt stated she was fine as the boil has already burst and gone down. Pt stated she will call back if any issue arise.

## 2022-12-09 ENCOUNTER — Ambulatory Visit (INDEPENDENT_AMBULATORY_CARE_PROVIDER_SITE_OTHER): Payer: Medicare HMO | Admitting: Family Medicine

## 2022-12-09 ENCOUNTER — Encounter: Payer: Self-pay | Admitting: Family Medicine

## 2022-12-09 VITALS — BP 108/74 | HR 52 | Temp 97.6°F | Ht 65.0 in | Wt 133.0 lb

## 2022-12-09 DIAGNOSIS — N764 Abscess of vulva: Secondary | ICD-10-CM | POA: Diagnosis not present

## 2022-12-09 MED ORDER — AMOXICILLIN-POT CLAVULANATE 875-125 MG PO TABS: 1.0000 | ORAL_TABLET | Freq: Two times a day (BID) | ORAL | 0 refills | Status: DC

## 2022-12-09 MED ORDER — MUPIROCIN 2 % EX OINT: 1.0000 | TOPICAL_OINTMENT | Freq: Two times a day (BID) | CUTANEOUS | 0 refills | Status: DC

## 2022-12-09 NOTE — Progress Notes (Signed)
Subjective:     Patient ID: Brandi Stevens, female    DOB: September 22, 1955, 67 y.o.   MRN: [id number]  Chief Complaint  Patient presents with   Mass    Boil on lip of vagina first noticed last Sunday. Monday applied heat and it burst and now has multiple openings but not as big. Just wants it checked out    HPI  Discussed the use of AI scribe software for clinical note transcription with the patient, who gave verbal consent to proceed.  History of Present Illness          Here with c/o abscess in genital area since last week. States the area started draining after warm compresses. Using mupirocin ointment.  No fever, chills, N/V  Health Maintenance Due  Topic Date Due   DTaP/Tdap/Td (2 - Td or Tdap) 03/09/2022   INFLUENZA VACCINE  10/08/2022   Lung Cancer Screening  12/16/2022    Past Medical History:  Diagnosis Date   Arthritis    Bilateral shoulders.   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   R breast   Graves disease 1980s   RAI in 2008, not on any thyroid replacement hormone   Neutropenia (HCC) 05/16/2021   Osteoarthritis of shoulder    Bilateral arthritis/bursitis   Rotator cuff tear arthropathy    R cuff   Seasonal allergies    SVD (spontaneous vaginal delivery)    x 3   Tubular adenoma of colon 01/2015    Past Surgical History:  Procedure Laterality Date   ABDOMINAL HYSTERECTOMY  1989   BREAST BIOPSY  12/22/2011   Procedure: BREAST BIOPSY;  Surgeon: Axel Filler, MD;  Location: [facility] OR;  Service: General;  Laterality: Right;  Excision right breast mass   BREAST EXCISIONAL BIOPSY     CHOLECYSTECTOMY  1989   COLONOSCOPY  01/2015   hx polyps-[facility]   COLONOSCOPY  2019   MS-MAC-prep good (goly)-TA- repeat 3 yrs   EYE SURGERY Left    MULTIPLE TOOTH EXTRACTIONS     POLYPECTOMY  2019   TA   ROTATOR CUFF REPAIR     R shoulder  x2 Dr. Noralyn Pick at [facility]   ROTATOR CUFF REPAIR Right    SHOULDER ARTHROSCOPY W/ SUPERIOR LABRAL ANTERIOR POSTERIOR LESION REPAIR Right    bursitis   TUBAL LIGATION      Family History  Problem Relation Age of Onset   Colon polyps Mother 61   Pancreatic cancer Mother 80       pancreatic   Hearing loss Father    Stroke Father 33       ischemic stroke   Prostate cancer Brother 40       with mets   Gastric cancer Maternal Aunt        gastric   Colon polyps Maternal Grandfather 36   Colon cancer Maternal Grandfather 65   Rectal cancer Neg Hx    Stomach cancer Neg Hx    Esophageal cancer Neg Hx     Social History   Socioeconomic History   Marital status: Divorced    Spouse name: Not on file   Number of children: 3   Years of education: Not on file   Highest education level: Not on file  Occupational History   Occupation: CNA    Comment: [employer]  Tobacco Use   Smoking status: Former    Current packs/day: 1.00    Average packs/day: 1 pack/day for 20.0 years (20.0 ttl pk-yrs)    Types: Cigarettes    Start date: 11/08/2011   Smokeless tobacco: Never  Vaping Use   Vaping status: Never Used  Substance and Sexual Activity   Alcohol use: No    Alcohol/week: 0.0 standard drinks of alcohol   Drug use: No   Sexual activity: Not Currently    Birth control/protection: Post-menopausal  Other Topics Concern   Not on file  Social History Narrative   Live in [place], lives alone. Sons nearby.    Social Determinants of Health   Financial Resource Strain: Low Risk  (05/13/2022)   Overall Financial Resource Strain (CARDIA)    Difficulty of Paying Living Expenses: Not hard at all  Food Insecurity: No Food Insecurity (05/13/2022)   Hunger Vital Sign    Worried About Running Out of Food in the Last Year: Never true    Ran Out of Food in the Last Year: Never true  Transportation Needs: No Transportation Needs (05/13/2022)   PRAPARE - Scientist, research (physical sciences) (Medical): No    Lack of Transportation (Non-Medical): No  Physical Activity: Sufficiently Active (05/13/2022)   Exercise Vital Sign    Days of Exercise per Week: 7 days    Minutes of Exercise per Session: 60 min  Stress: No Stress Concern Present (05/13/2022)   [employer] of Occupational Health - Occupational Stress Questionnaire    Feeling of Stress : Not at all  Social Connections: Moderately Integrated (05/13/2022)   Social Connection and Isolation Panel [NHANES]    Frequency of Communication with Friends and Family: More than three times a week    Frequency of Social Gatherings with Friends and Family: More than three times a week    Attends Religious Services: More than 4 times per year    Active Member of [employer] or Organizations: Yes    Attends Engineer, structural: More than 4 times per year    Marital Status: Divorced  Intimate Partner Violence: Not At Risk (05/13/2022)   Humiliation, Afraid, Rape, and Kick questionnaire    Fear of Current or Ex-Partner: No    Emotionally Abused: No    Physically Abused: No    Sexually Abused: No    Outpatient Medications Prior to Visit  Medication Sig Dispense Refill   acetaminophen (TYLENOL) 650 MG CR tablet Take 650 mg by mouth every 8 (eight) hours as needed for pain.     albuterol (VENTOLIN HFA) 108 (90 Base) MCG/ACT inhaler Inhale 2 puffs into the lungs every 6 (six) hours as needed for wheezing or shortness of breath. 8 g 0   cetirizine (ZYRTEC) 10 MG chewable tablet Chew 10 mg by mouth daily as needed.     cholecalciferol (VITAMIN D) 1000 UNITS tablet Take 5,000 Units by mouth 3 (three) times a week.     diclofenac sodium (VOLTAREN) 1 % GEL Apply 2 g topically daily as needed. For shoulder pain per patient 6 Tube 1   fluticasone (FLONASE) 50 MCG/ACT nasal spray Place 1 spray into both nostrils daily. 16 g 5   gabapentin (NEURONTIN) 100 MG capsule Take 1 capsule (100 mg total) by mouth at bedtime. 30 capsule 2    linaclotide (LINZESS) 145 MCG CAPS capsule Take 1 capsule (145 mcg total) by mouth daily as needed. 30 capsule 2   Multiple Vitamins-Minerals (CENTRUM  SILVER PO) Take 1 tablet by mouth daily.     Olopatadine HCl 0.2 % SOLN Apply 1 drop to eye in the morning. 2.5 mL 2   polyethylene glycol powder (GLYCOLAX/MIRALAX) 17 GM/SCOOP powder Take 17 g by mouth 2 (two) times daily as needed. 225 g 5   No facility-administered medications prior to visit.    Allergies  Allergen Reactions   Boniva [Ibandronic Acid] Diarrhea and Nausea And Vomiting   Latex Anaphylaxis and Rash    Latex gloves/ skin itch    Sulfa Antibiotics Shortness Of Breath, Itching and Anaphylaxis    Also, wheezing    Review of Systems  Constitutional:  Negative for chills and fever.  Respiratory:  Negative for shortness of breath.   Cardiovascular:  Negative for chest pain and palpitations.  Gastrointestinal:  Negative for abdominal pain, diarrhea, nausea and vomiting.  Genitourinary:  Negative for dysuria, frequency and urgency.  Skin:  Positive for rash.  Neurological:  Negative for dizziness.       Objective:    Physical Exam Exam conducted with a chaperone present.  Constitutional:      General: She is not in acute distress.    Appearance: She is not ill-appearing.  HENT:     Right Ear: External ear normal.     Left Ear: External ear normal.  Eyes:     Extraocular Movements: Extraocular movements intact.     Conjunctiva/sclera: Conjunctivae normal.  Cardiovascular:     Rate and Rhythm: Normal rate.  Pulmonary:     Effort: Pulmonary effort is normal.  Genitourinary:      Comments: Slightly firm, raised, red area with purulent drainage, no fluctuance  Musculoskeletal:     Cervical back: Normal range of motion and neck supple.  Skin:    General: Skin is warm and dry.  Neurological:     General: No focal deficit present.     Mental Status: She is alert and oriented to person, place, and time.   Psychiatric:        Mood and Affect: Mood normal.        Behavior: Behavior normal.        Thought Content: Thought content normal.      BP 108/74 (BP Location: Left Arm, Patient Position: Sitting, Cuff Size: Normal)   Pulse (!) 52   Temp 97.6 F (36.4 C) (Temporal)   Ht 5\' 5"  (1.651 m)   Wt 133 lb (60.3 kg)   SpO2 100%   BMI 22.13 kg/m  Wt Readings from Last 3 Encounters:  12/09/22 133 lb (60.3 kg)  07/03/22 135 lb 2 oz (61.3 kg)  05/13/22 132 lb (59.9 kg)       Assessment & Plan:   Problem List Items Addressed This Visit   None Visit Diagnoses     Vulvar abscess    -  Primary   Relevant Medications   mupirocin ointment (BACTROBAN) 2 %   amoxicillin-clavulanate (AUGMENTIN) 875-125 MG tablet   Other Relevant Orders   Ambulatory referral to Obstetrics / Gynecology      Denies history of MRSA or diabetes. Currently no red flag symptoms.  No distress. Augmentin prescribed.  Mupirocin also prescribed.  Continue warm compresses.  She will let us know if the area is worsening.  Referral placed to Dr. Edward Jolly as requested.  I am having Susann Givens. Bowell start on mupirocin ointment and amoxicillin-clavulanate. I am also having her maintain her cholecalciferol, Multiple Vitamins-Minerals (CENTRUM SILVER PO), acetaminophen, cetirizine, diclofenac sodium,  polyethylene glycol powder, fluticasone, linaclotide, gabapentin, albuterol, and Olopatadine HCl.  Meds ordered this encounter  Medications   mupirocin ointment (BACTROBAN) 2 %    Sig: Apply 1 Application topically 2 (two) times daily.    Dispense:  22 g    Refill:  0    Order Specific Question:   Supervising Provider    Answer:   Hillard Danker A [[id number]]   amoxicillin-clavulanate (AUGMENTIN) 875-125 MG tablet    Sig: Take 1 tablet by mouth 2 (two) times daily.    Dispense:  20 tablet    Refill:  0    Order Specific Question:   Supervising Provider    Answer:   Hillard Danker A [[id number]]

## 2022-12-09 NOTE — Patient Instructions (Signed)
Take the oral antibiotic as prescribed with food.  Use the topical mupirocin ointment.  Continue using heat/warm compresses.  Call and schedule with Dr. Edward Jolly.   If you develop fever, chills, worsening pain or any other concerning symptoms, let us know.

## 2022-12-17 ENCOUNTER — Other Ambulatory Visit: Payer: Self-pay | Admitting: Nurse Practitioner

## 2022-12-17 ENCOUNTER — Encounter: Payer: Self-pay | Admitting: Nurse Practitioner

## 2022-12-17 DIAGNOSIS — J438 Other emphysema: Secondary | ICD-10-CM

## 2022-12-23 ENCOUNTER — Telehealth: Payer: Self-pay | Admitting: Nurse Practitioner

## 2022-12-23 NOTE — Telephone Encounter (Signed)
Pt reports it is not until next month

## 2022-12-23 NOTE — Telephone Encounter (Signed)
Pt called wanting to know do she need to do another round of antibiotics or come back in the boil has went down considerably but still has 2 little heads on it. Please update pt of what she need to do.

## 2022-12-23 NOTE — Telephone Encounter (Signed)
Called and made f/u appt for friday

## 2022-12-23 NOTE — Telephone Encounter (Signed)
Please advise 

## 2022-12-25 ENCOUNTER — Ambulatory Visit (INDEPENDENT_AMBULATORY_CARE_PROVIDER_SITE_OTHER): Payer: Medicare HMO | Admitting: Family Medicine

## 2022-12-25 ENCOUNTER — Encounter: Payer: Self-pay | Admitting: Family Medicine

## 2022-12-25 VITALS — BP 114/70 | HR 60 | Temp 97.8°F | Ht 65.0 in | Wt 135.0 lb

## 2022-12-25 DIAGNOSIS — N764 Abscess of vulva: Secondary | ICD-10-CM

## 2022-12-25 DIAGNOSIS — Z09 Encounter for follow-up examination after completed treatment for conditions other than malignant neoplasm: Secondary | ICD-10-CM

## 2022-12-25 NOTE — Patient Instructions (Signed)
Keep doing the warm compresses. You can use the mupirocin ointment for a few more days.  Let us know if you have any new or worsening symptoms.

## 2022-12-25 NOTE — Progress Notes (Signed)
Subjective:     Patient ID: Brandi Stevens, female    DOB: Jul 02, 1955, 67 y.o.   MRN: [id number]  Chief Complaint  Patient presents with   Follow-up    F/u on vulvar abscess, states there are 2 white heads still present, swelling is down though. Still applying heat and ointment    HPI   History of Present Illness         Here for follow up exam after completing oral Augmentin.   Reports symptoms have mostly resolved.   Appt with gynecologist but not until January.    Health Maintenance Due  Topic Date Due   DTaP/Tdap/Td (2 - Td or Tdap) 03/09/2022   Lung Cancer Screening  12/16/2022    Past Medical History:  Diagnosis Date   Arthritis    Bilateral shoulders.   Bradycardia    Exercise stress test normal 2008   Cyst of ear canal 10/08/2022   Diverticulitis 2012   Emphysema    Last PFTs 1980s, not on current inhaler thearpy   Fibroadenoma of breast 01/2012   R breast   Graves disease 1980s   RAI in 2008, not on any thyroid replacement hormone   Neutropenia (HCC) 05/16/2021   Osteoarthritis of shoulder    Bilateral arthritis/bursitis   Rotator cuff tear arthropathy    R cuff   Seasonal allergies    SVD (spontaneous vaginal delivery)    x 3   Tubular adenoma of colon 01/2015    Past Surgical History:  Procedure Laterality Date   ABDOMINAL HYSTERECTOMY  1989   BREAST BIOPSY  12/22/2011   Procedure: BREAST BIOPSY;  Surgeon: Axel Filler, MD;  Location: [facility] OR;  Service: General;  Laterality: Right;  Excision right breast mass   BREAST EXCISIONAL BIOPSY     CHOLECYSTECTOMY  1989   COLONOSCOPY  01/2015   hx polyps-[facility]   COLONOSCOPY  2019   MS-MAC-prep good (goly)-TA- repeat 3 yrs   EYE SURGERY Left    MULTIPLE TOOTH EXTRACTIONS     POLYPECTOMY  2019   TA   ROTATOR CUFF REPAIR     R shoulder x2 Dr. Noralyn Pick at [facility]   ROTATOR CUFF REPAIR Right    SHOULDER ARTHROSCOPY W/ SUPERIOR LABRAL ANTERIOR POSTERIOR LESION REPAIR Right    bursitis    TUBAL LIGATION      Family History  Problem Relation Age of Onset   Colon polyps Mother 20   Pancreatic cancer Mother 58       pancreatic   Hearing loss Father    Stroke Father 73       ischemic stroke   Prostate cancer Brother 52       with mets   Gastric cancer Maternal Aunt        gastric   Colon polyps Maternal Grandfather 46   Colon cancer Maternal Grandfather 65   Rectal cancer Neg Hx    Stomach cancer Neg Hx    Esophageal cancer Neg Hx     Social History   Socioeconomic History   Marital status: Divorced    Spouse name: Not on file   Number of children: 3   Years of education: Not on file   Highest education level: Not on file  Occupational History   Occupation: CNA    Comment: [employer]  Tobacco Use   Smoking status: Former    Current packs/day: 1.00    Average packs/day: 1 pack/day for 20.0 years (20.0 ttl  pk-yrs)    Types: Cigarettes    Start date: 11/08/2011   Smokeless tobacco: Never  Vaping Use   Vaping status: Never Used  Substance and Sexual Activity   Alcohol use: No    Alcohol/week: 0.0 standard drinks of alcohol   Drug use: No   Sexual activity: Not Currently    Birth control/protection: Post-menopausal  Other Topics Concern   Not on file  Social History Narrative   Live in [place], lives alone. Sons nearby.    Social Determinants of Health   Financial Resource Strain: Low Risk  (05/13/2022)   Overall Financial Resource Strain (CARDIA)    Difficulty of Paying Living Expenses: Not hard at all  Food Insecurity: No Food Insecurity (05/13/2022)   Hunger Vital Sign    Worried About Running Out of Food in the Last Year: Never true    Ran Out of Food in the Last Year: Never true  Transportation Needs: No Transportation Needs (05/13/2022)   PRAPARE - Administrator, Civil Service (Medical): No    Lack of Transportation (Non-Medical): No  Physical Activity: Sufficiently Active (05/13/2022)   Exercise Vital Sign    Days of  Exercise per Week: 7 days    Minutes of Exercise per Session: 60 min  Stress: No Stress Concern Present (05/13/2022)   [employer] of Occupational Health - Occupational Stress Questionnaire    Feeling of Stress : Not at all  Social Connections: Moderately Integrated (05/13/2022)   Social Connection and Isolation Panel [NHANES]    Frequency of Communication with Friends and Family: More than three times a week    Frequency of Social Gatherings with Friends and Family: More than three times a week    Attends Religious Services: More than 4 times per year    Active Member of [employer] or Organizations: Yes    Attends Engineer, structural: More than 4 times per year    Marital Status: Divorced  Intimate Partner Violence: Not At Risk (05/13/2022)   Humiliation, Afraid, Rape, and Kick questionnaire    Fear of Current or Ex-Partner: No    Emotionally Abused: No    Physically Abused: No    Sexually Abused: No    Outpatient Medications Prior to Visit  Medication Sig Dispense Refill   acetaminophen (TYLENOL) 650 MG CR tablet Take 650 mg by mouth every 8 (eight) hours as needed for pain.     albuterol (VENTOLIN HFA) 108 (90 Base) MCG/ACT inhaler Inhale 2 puffs into the lungs every 6 (six) hours as needed for wheezing or shortness of breath. 8 g 0   cetirizine (ZYRTEC) 10 MG chewable tablet Chew 10 mg by mouth daily as needed.     cholecalciferol (VITAMIN D) 1000 UNITS tablet Take 5,000 Units by mouth 3 (three) times a week.     diclofenac sodium (VOLTAREN) 1 % GEL Apply 2 g topically daily as needed. For shoulder pain per patient 6 Tube 1   fluticasone (FLONASE) 50 MCG/ACT nasal spray Place 1 spray into both nostrils daily. 16 g 5   gabapentin (NEURONTIN) 100 MG capsule Take 1 capsule (100 mg total) by mouth at bedtime. 30 capsule 2   linaclotide (LINZESS) 145 MCG CAPS capsule Take 1 capsule (145 mcg total) by mouth daily as needed. 30 capsule 2   Multiple Vitamins-Minerals (CENTRUM SILVER  PO) Take 1 tablet by mouth daily.     mupirocin ointment (BACTROBAN) 2 % Apply 1 Application topically 2 (two) times daily. 22 g  0   Olopatadine HCl 0.2 % SOLN Apply 1 drop to eye in the morning. 2.5 mL 2   polyethylene glycol powder (GLYCOLAX/MIRALAX) 17 GM/SCOOP powder Take 17 g by mouth 2 (two) times daily as needed. 225 g 5   amoxicillin-clavulanate (AUGMENTIN) 875-125 MG tablet Take 1 tablet by mouth 2 (two) times daily. 20 tablet 0   No facility-administered medications prior to visit.    Allergies  Allergen Reactions   Boniva [Ibandronic Acid] Diarrhea and Nausea And Vomiting   Latex Anaphylaxis and Rash    Latex gloves/ skin itch    Sulfa Antibiotics Shortness Of Breath, Itching and Anaphylaxis    Also, wheezing    Review of Systems  Constitutional:  Negative for fever.  Respiratory:  Negative for shortness of breath.   Cardiovascular:  Negative for chest pain, palpitations and leg swelling.  Gastrointestinal:  Negative for abdominal pain, constipation, diarrhea, nausea and vomiting.  Genitourinary:  Negative for dysuria, frequency and urgency.  Neurological:  Negative for dizziness, focal weakness and seizures.       Objective:    Physical Exam Exam conducted with a chaperone present.  Constitutional:      General: She is not in acute distress.    Appearance: She is not ill-appearing.  Eyes:     Extraocular Movements: Extraocular movements intact.     Conjunctiva/sclera: Conjunctivae normal.  Cardiovascular:     Rate and Rhythm: Normal rate.  Pulmonary:     Effort: Pulmonary effort is normal.  Genitourinary:      Comments: 2 small areas of superficial firmness without erythema, fluctuance or drainage. Healing.  Musculoskeletal:     Cervical back: Normal range of motion and neck supple.  Skin:    General: Skin is warm and dry.  Neurological:     General: No focal deficit present.     Mental Status: She is alert and oriented to person, place, and time.   Psychiatric:        Mood and Affect: Mood normal.        Behavior: Behavior normal.        Thought Content: Thought content normal.      BP 114/70 (BP Location: Left Arm, Patient Position: Sitting, Cuff Size: Large)   Pulse 60   Temp 97.8 F (36.6 C) (Temporal)   Ht 5\' 5"  (1.651 m)   Wt 135 lb (61.2 kg)   SpO2 98%   BMI 22.47 kg/m  Wt Readings from Last 3 Encounters:  12/25/22 135 lb (61.2 kg)  12/09/22 133 lb (60.3 kg)  07/03/22 135 lb 2 oz (61.3 kg)       Assessment & Plan:   Problem List Items Addressed This Visit   None Visit Diagnoses     Vulvar abscess    -  Primary   Follow-up exam          Healing. Significant improvement since course of oral Augmentin. Continue warm compresses. Follow up as schedule with PCP.   I have discontinued Kerriana Kaina. Rieth's amoxicillin-clavulanate. I am also having her maintain her cholecalciferol, Multiple Vitamins-Minerals (CENTRUM SILVER PO), acetaminophen, cetirizine, diclofenac sodium, polyethylene glycol powder, fluticasone, linaclotide, gabapentin, albuterol, Olopatadine HCl, and mupirocin ointment.  No orders of the defined types were placed in this encounter.

## 2023-01-15 ENCOUNTER — Ambulatory Visit (INDEPENDENT_AMBULATORY_CARE_PROVIDER_SITE_OTHER): Payer: Medicare HMO | Admitting: Nurse Practitioner

## 2023-01-15 VITALS — BP 130/82 | HR 54 | Temp 98.3°F | Ht 65.0 in | Wt 132.4 lb

## 2023-01-15 DIAGNOSIS — E0500 Thyrotoxicosis with diffuse goiter without thyrotoxic crisis or storm: Secondary | ICD-10-CM

## 2023-01-15 DIAGNOSIS — R7303 Prediabetes: Secondary | ICD-10-CM

## 2023-01-15 DIAGNOSIS — E559 Vitamin D deficiency, unspecified: Secondary | ICD-10-CM

## 2023-01-15 DIAGNOSIS — Z87891 Personal history of nicotine dependence: Secondary | ICD-10-CM

## 2023-01-15 DIAGNOSIS — J309 Allergic rhinitis, unspecified: Secondary | ICD-10-CM | POA: Diagnosis not present

## 2023-01-15 MED ORDER — LEVOCETIRIZINE DIHYDROCHLORIDE 5 MG PO TABS: 2.5000 mg | ORAL_TABLET | Freq: Every evening | ORAL | 1 refills | Status: AC

## 2023-01-15 NOTE — Assessment & Plan Note (Signed)
Chronic, stable Patient encouraged to continue focusing on lifestyle modification and dietary changes that she has already done. Plan on checking A1c at next office visit

## 2023-01-15 NOTE — Assessment & Plan Note (Signed)
Chronic Still not well-controlled Per shared decision making patient will try Xyzal 2.5 mg every evening by mouth as needed.  She reports that sometimes she has to work nights so we have decided that she will take Zyrtec as this is less sedating for her on the nights that she has to work.  Additionally she will continue Flonase nasal spray and eyedrops.

## 2023-01-15 NOTE — Assessment & Plan Note (Signed)
Chronic, stable based on last labs Plan on checking thyroid panel at next office visit

## 2023-01-15 NOTE — Assessment & Plan Note (Signed)
Patient is due for lung cancer screening Referral made today

## 2023-01-15 NOTE — Assessment & Plan Note (Signed)
Chronic, stable Plan check vitamin D level at next office visit Continue vitamin D supplement in the meantime

## 2023-01-15 NOTE — Progress Notes (Signed)
Established Patient Office Visit  Subjective   Patient ID: Brandi Stevens, female    DOB: 06-Aug-1955  Age: 67 y.o. MRN: [id number]  Chief Complaint  Patient presents with   Allergic Rhinitis     Allergic Rhinitis: Continues to have seasonal allergies and allergy symptoms.  Is currently taking Zyrtec daily, Flonase nasal spray, and olopatadine eyedrops.  Tolerating medication well but reports symptoms are not fully controlled. Prediabetes: Last A1c 6.1.  She has made significant dietary changes specifically cutting out sodas and reducing intake of starches.  She also reports that she is exercising more including lifting weights. Graves' disease: Last thyroid panel within normal limits Vitamin D deficiency: Continues on vitamin D supplementation 3 times a week.  Last serum level was 32.75    Review of Systems  Respiratory:  Negative for shortness of breath.   Cardiovascular:  Negative for chest pain.      Objective:     BP 130/82   Pulse (!) 54   Temp 98.3 F (36.8 C) (Temporal)   Ht 5\' 5"  (1.651 m)   Wt 132 lb 6 oz (60 kg)   SpO2 93%   BMI 22.03 kg/m    Physical Exam Vitals reviewed.  Constitutional:      General: She is not in acute distress.    Appearance: Normal appearance.  HENT:     Head: Normocephalic and atraumatic.  Neck:     Vascular: No carotid bruit.  Cardiovascular:     Rate and Rhythm: Normal rate and regular rhythm.     Pulses: Normal pulses.     Heart sounds: Normal heart sounds.  Pulmonary:     Effort: Pulmonary effort is normal.     Breath sounds: Normal breath sounds.  Skin:    General: Skin is warm and dry.  Neurological:     General: No focal deficit present.     Mental Status: She is alert and oriented to person, place, and time.  Psychiatric:        Mood and Affect: Mood normal.        Behavior: Behavior normal.        Judgment: Judgment normal.      No results found for any visits on 01/15/23.    The 10-year ASCVD risk  score (Arnett DK, et al., 2019) is: 8.2%    Assessment & Plan:   Problem List Items Addressed This Visit       Respiratory   Allergic rhinitis - Primary    Chronic Still not well-controlled Per shared decision making patient will try Xyzal 2.5 mg every evening by mouth as needed.  She reports that sometimes she has to work nights so we have decided that she will take Zyrtec as this is less sedating for her on the nights that she has to work.  Additionally she will continue Flonase nasal spray and eyedrops.      Relevant Medications   levocetirizine (XYZAL) 5 MG tablet     Endocrine   Graves disease    Chronic, stable based on last labs Plan on checking thyroid panel at next office visit      Relevant Orders   CBC   Comprehensive metabolic panel   Hemoglobin A1c   Lipid panel   TSH   T3, free   T4, free     Other   Vitamin D deficiency    Chronic, stable Plan check vitamin D level at next office visit Continue vitamin D supplement in  the meantime      Relevant Orders   CBC   Comprehensive metabolic panel   Hemoglobin A1c   Lipid panel   TSH   T3, free   T4, free   VITAMIN D 25 Hydroxy (Vit-D Deficiency, Fractures)   Prediabetes    Chronic, stable Patient encouraged to continue focusing on lifestyle modification and dietary changes that she has already done. Plan on checking A1c at next office visit      Relevant Orders   CBC   Comprehensive metabolic panel   Hemoglobin A1c   Lipid panel   TSH   T3, free   T4, free   Former smoker    Patient is due for lung cancer screening Referral made today      Relevant Orders   Ambulatory Referral Lung Cancer Screening [facility]    Return in about 6 months (around 07/15/2023) for F/U with Maloni Musleh.    Elenore Paddy, NP

## 2023-01-19 ENCOUNTER — Other Ambulatory Visit: Payer: Self-pay

## 2023-01-19 DIAGNOSIS — Z87891 Personal history of nicotine dependence: Secondary | ICD-10-CM

## 2023-01-19 DIAGNOSIS — Z122 Encounter for screening for malignant neoplasm of respiratory organs: Secondary | ICD-10-CM

## 2023-01-21 ENCOUNTER — Ambulatory Visit (INDEPENDENT_AMBULATORY_CARE_PROVIDER_SITE_OTHER): Payer: Medicare HMO | Admitting: Physician Assistant

## 2023-01-21 ENCOUNTER — Encounter: Payer: Self-pay | Admitting: Physician Assistant

## 2023-01-21 DIAGNOSIS — Z87891 Personal history of nicotine dependence: Secondary | ICD-10-CM | POA: Diagnosis not present

## 2023-01-21 NOTE — Progress Notes (Signed)
Virtual Visit via Telephone Note  I connected with Brandi Stevens on 01/21/23 at 10:29 AM  by telephone and verified that I am speaking with the correct person using two identifiers.  Location: Patient: home Provider: working virtually from home   I discussed the limitations, risks, security and privacy concerns of performing an evaluation and management service by telephone and the availability of in person appointments. I also discussed with the patient that there may be a patient responsible charge related to this service. The patient expressed understanding and agreed to proceed.       Shared Decision Making Visit Lung Cancer Screening Program [phone])   Eligibility: Age 67 Pack Years Smoking History Calculation 38 (# packs/per year x # years smoked) Recent History of coughing up blood  no Unexplained weight loss? No ( >Than 15 pounds within the last 6 months ) Prior History Lung / other cancer No (Diagnosis within the last 5 years already requiring surveillance chest CT Scans). Smoking Status Former Smoker Former Smokers: Years since quit: 3 years  Quit Date: 2021  Visit Components: Discussion included one or more decision making aids? Yes Discussion included risk/benefits of screening. Yes Discussion included potential follow up diagnostic testing for abnormal scans. Yes Discussion included meaning and risk of over diagnosis. Yes Discussion included meaning and risk of False Positives. Yes Discussion included meaning of total radiation exposure. Yes  Counseling Included: Importance of adherence to annual lung cancer LDCT screening. Yes Impact of comorbidities on ability to participate in the program. Yes Ability and willingness to under diagnostic treatment. Yes  Smoking Cessation Counseling: Former Smokers:  Discussed the importance of maintaining cigarette abstinence. Yes Diagnosis Code: Personal History of Nicotine Dependence. U04.540 Information about  tobacco cessation classes and interventions provided to patient. Yes Written Order for Lung Cancer Screening with LDCT placed in Epic. Yes (CT Chest Lung Cancer Screening Low Dose W/O CM) [id number] Z12.2-Screening of respiratory organs Z87.891-Personal history of nicotine dependence    I spent 25 minutes of face to face time/virtual visit time  with the patient discussing the risks and benefits of lung cancer screening. We took the time to pause at intervals to allow for questions to be asked and answered to ensure understanding. We discussed that they had taken the single most powerful action possible to decrease their risk of developing lung cancer when they quit smoking. I counseled them to remain smoke free, and to contact the office if they ever had the desire to smoke again so that we can provide resources and tools to help support the effort to remain smoke free. We discussed the time and location of the scan, and they  will receive a call or letter with the results within  24-72 hours of receiving them. They have the office contact information in the event they have questions.   They verbalized understanding of all of the above and had no further questions.    I explained to the patient that there has been a high incidence of coronary artery disease noted on these exams. I explained that this is a non-gated exam therefore degree or severity cannot be determined. This patient is on statin therapy. I have asked the patient to follow-up with their PCP regarding any incidental finding of coronary artery disease and management with diet or medication as they feel is clinically indicated. The patient verbalized understanding of the above and had no further questions.      Brandi Gasman Ashara Lounsbury, PA-C

## 2023-01-21 NOTE — Patient Instructions (Signed)
 Thank you for participating in the  Lung Cancer Screening Program. It was our pleasure to meet you today. We will call you with the results of your scan within the next few days. Your scan will be assigned a Lung RADS category score by the physicians reading the scans.  This Lung RADS score determines follow up scanning.  See below for description of categories, and follow up screening recommendations. We will be in touch to schedule your follow up screening annually or based on recommendations of our providers. We will fax a copy of your scan results to your Primary Care Physician, or the physician who referred you to the program, to ensure they have the results. Please call the office if you have any questions or concerns regarding your scanning experience or results.  Our office number is [phone]. Please speak with Abigail Miyamoto, RN., Karlton Lemon RN, or Pietro Cassis RN. They are  our Lung Cancer Screening RN.'s If They are unavailable when you call, Please leave a message on the voice mail. We will return your call at our earliest convenience.This voice mail is monitored several times a day.  Remember, if your scan is normal, we will scan you annually as long as you continue to meet the criteria for the program. (Age 66-80, Current smoker or smoker who has quit within the last 15 years). If you are a smoker, remember, quitting is the single most powerful action that you can take to decrease your risk of lung cancer and other pulmonary, breathing related problems. We know quitting is hard, and we are here to help.  Please let us know if there is anything we can do to help you meet your goal of quitting. If you are a former smoker, Counselling psychologist. We are proud of you! Remain smoke free! Remember you can refer friends or family members through the number above.  We will screen them to make sure they meet criteria for the program. Thank you for helping [place] take better care of you  by participating in Lung Screening.   Lung RADS Categories:  Lung RADS 1: no nodules or definitely non-concerning nodules.  Recommendation is for a repeat annual scan in 12 months.  Lung RADS 2:  nodules that are non-concerning in appearance and behavior with a very low likelihood of becoming an active cancer. Recommendation is for a repeat annual scan in 12 months.  Lung RADS 3: nodules that are probably non-concerning , includes nodules with a low likelihood of becoming an active cancer.  Recommendation is for a 40-month repeat screening scan. Often noted after an upper respiratory illness. We will be in touch to make sure you have no questions, and to schedule your 66-month scan.  Lung RADS 4 A: nodules with concerning findings, recommendation is most often for a follow up scan in 3 months or additional testing based on our provider's assessment of the scan. We will be in touch to make sure you have no questions and to schedule the recommended 3 month follow up scan.  Lung RADS 4 B:  indicates findings that are concerning. We will be in touch with you to schedule additional diagnostic testing based on our provider's  assessment of the scan.  You can receive free nicotine replacement therapy ( patches, gum or mints) by calling 1-800-QUIT NOW. Please call so we can get you on the path to becoming  a non-smoker. I know it is hard, but you can do this!  Other options for assistance in  smoking cessation ( As covered by your insurance benefits)  Hypnosis for smoking cessation  [employer] [phone]  Acupuncture for smoking cessation  [employer] [phone]

## 2023-01-27 ENCOUNTER — Ambulatory Visit
Admission: RE | Admit: 2023-01-27 | Discharge: 2023-01-27 | Disposition: A | Discharge status: Home / Self Care | Payer: Medicare HMO | Source: Ambulatory Visit | Attending: Nurse Practitioner | Admitting: Nurse Practitioner

## 2023-01-27 DIAGNOSIS — Z122 Encounter for screening for malignant neoplasm of respiratory organs: Secondary | ICD-10-CM

## 2023-01-27 DIAGNOSIS — Z87891 Personal history of nicotine dependence: Secondary | ICD-10-CM | POA: Diagnosis not present

## 2023-01-28 IMAGING — MG MM DIGITAL SCREENING BILAT W/ TOMO AND CAD
8 series · 9 of 24 positions shown · non-contrast
Comparison: Previous exam(s).

CLINICAL DATA: Screening.

EXAM:
DIGITAL SCREENING BILATERAL MAMMOGRAM WITH TOMOSYNTHESIS AND CAD
TECHNIQUE: Bilateral screening digital craniocaudal and mediolateral oblique
mammograms were obtained. Bilateral screening digital breast
tomosynthesis was performed. The images were evaluated with
computer-aided detection.

[L MLO synth-2D]
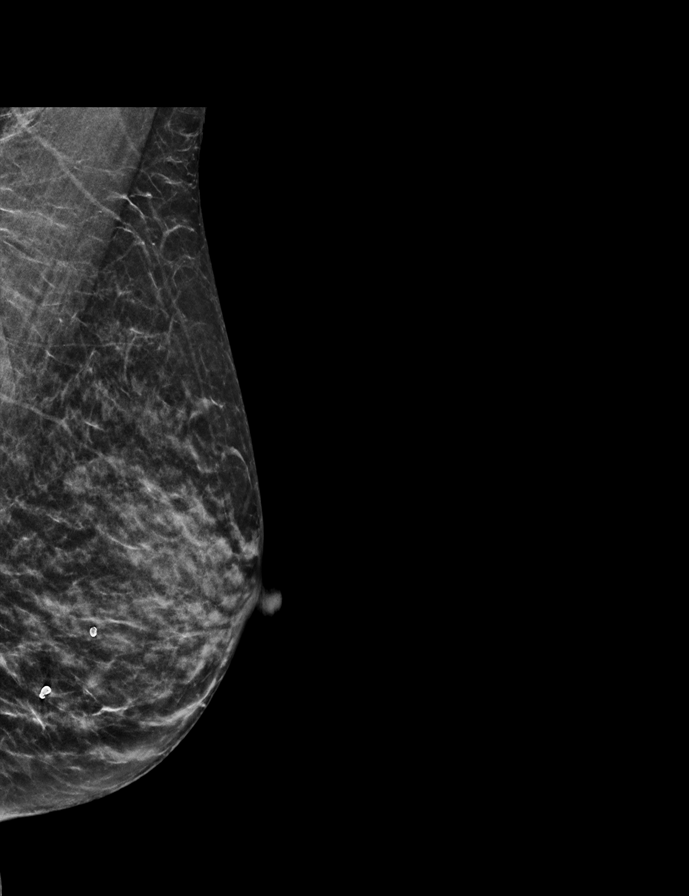

[R CC synth-2D]
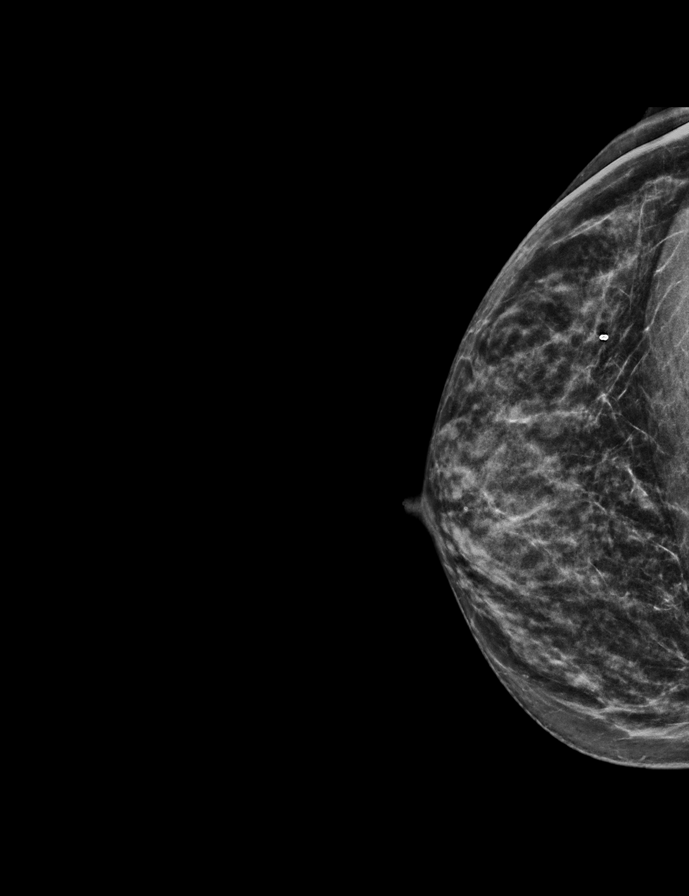

[L CC synth-2D]
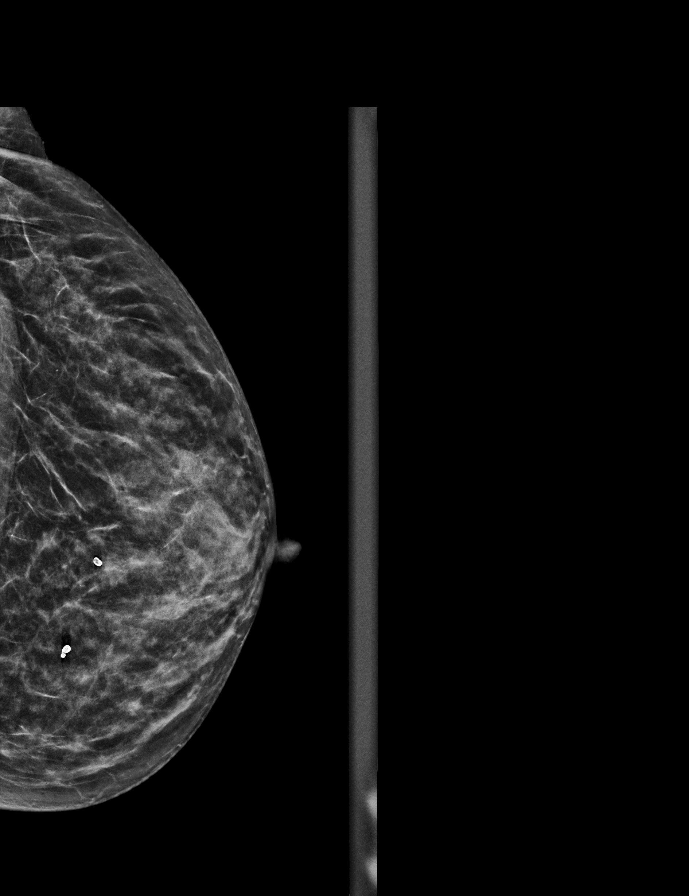

[R MLO synth-2D]
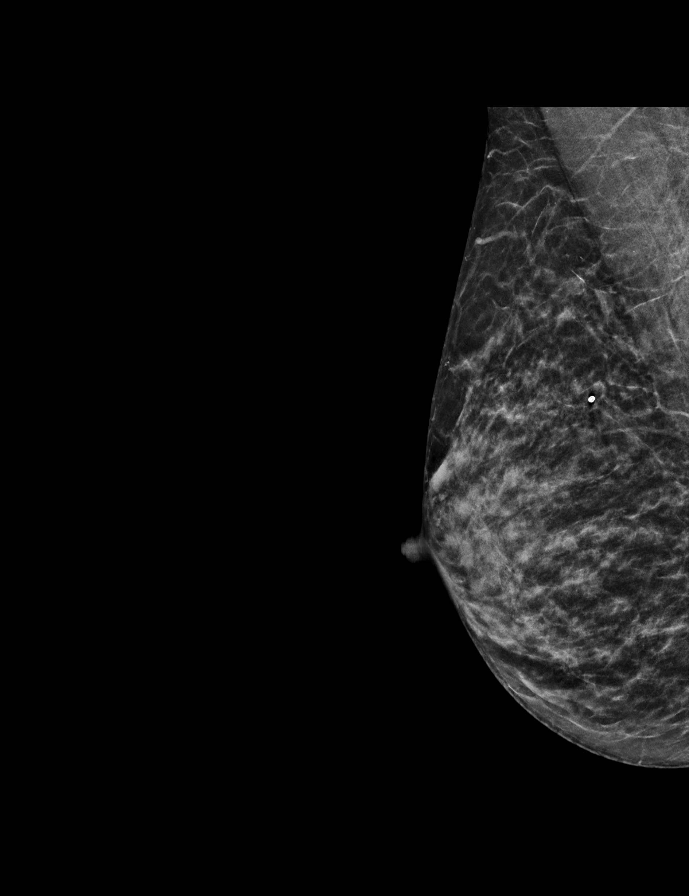

[L MLO tomo · 2 of 44 frames shown]
[frame 15/44]
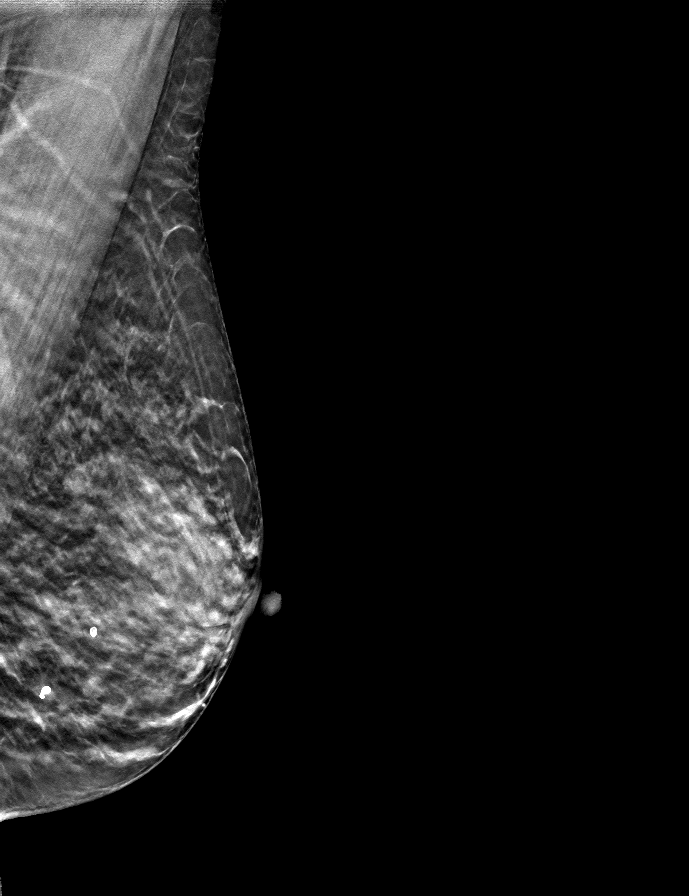
[frame 23/44]
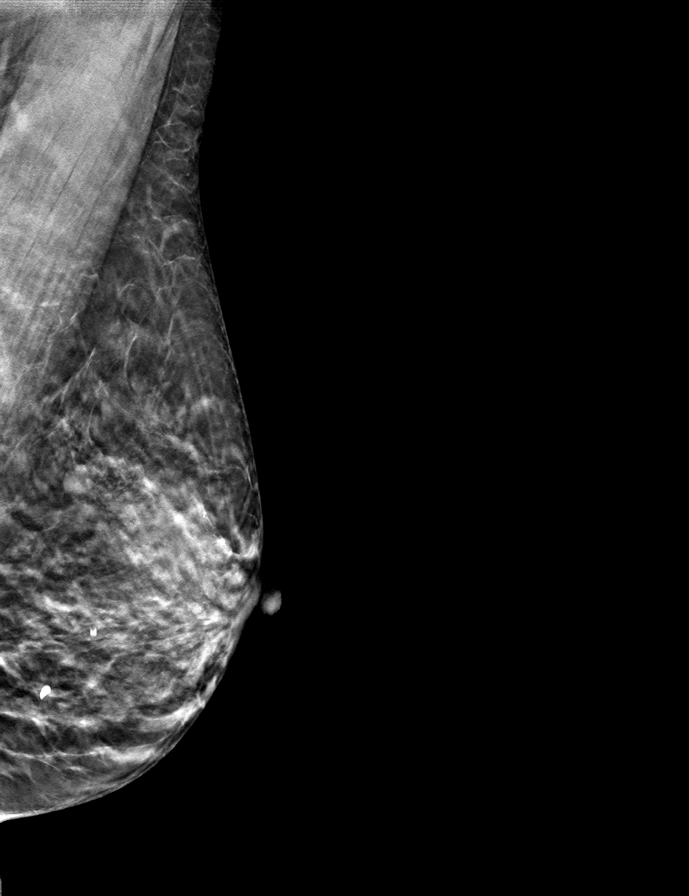

[L CC tomo · tomo slice 23/44.0]
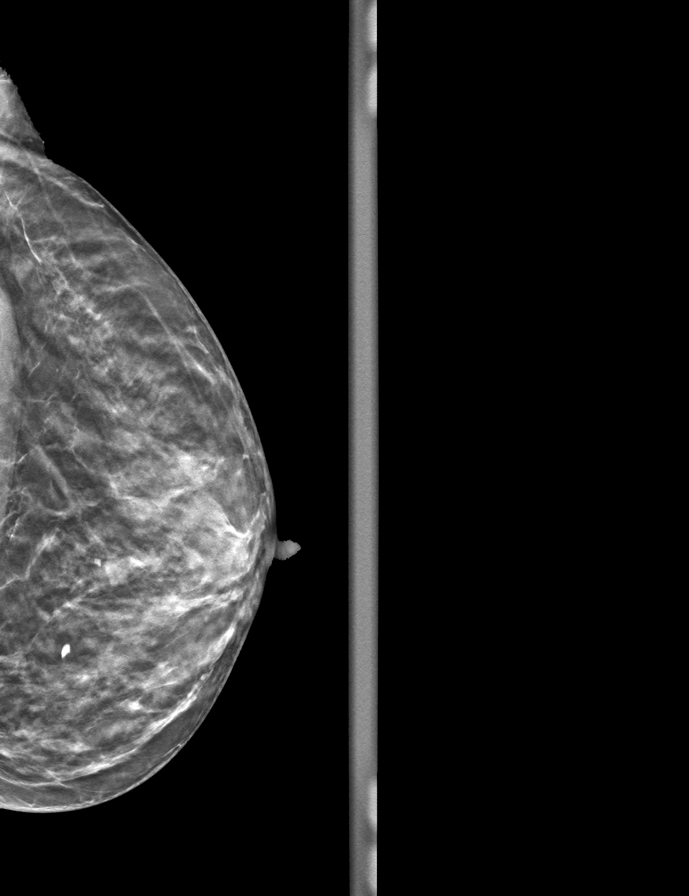

[R CC tomo · tomo slice 25/48.0]
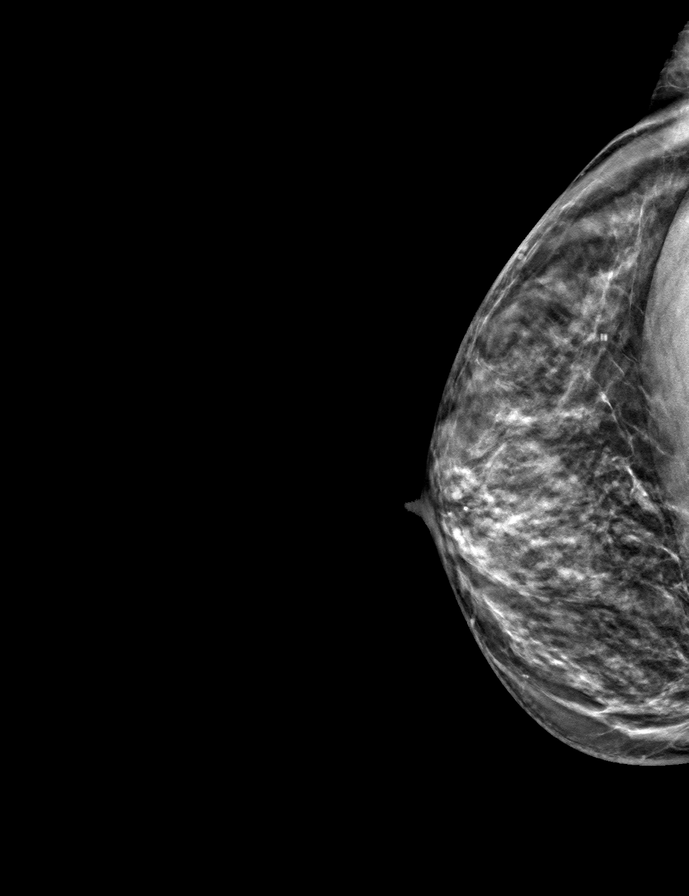

[R MLO tomo · tomo slice 22/43.0]
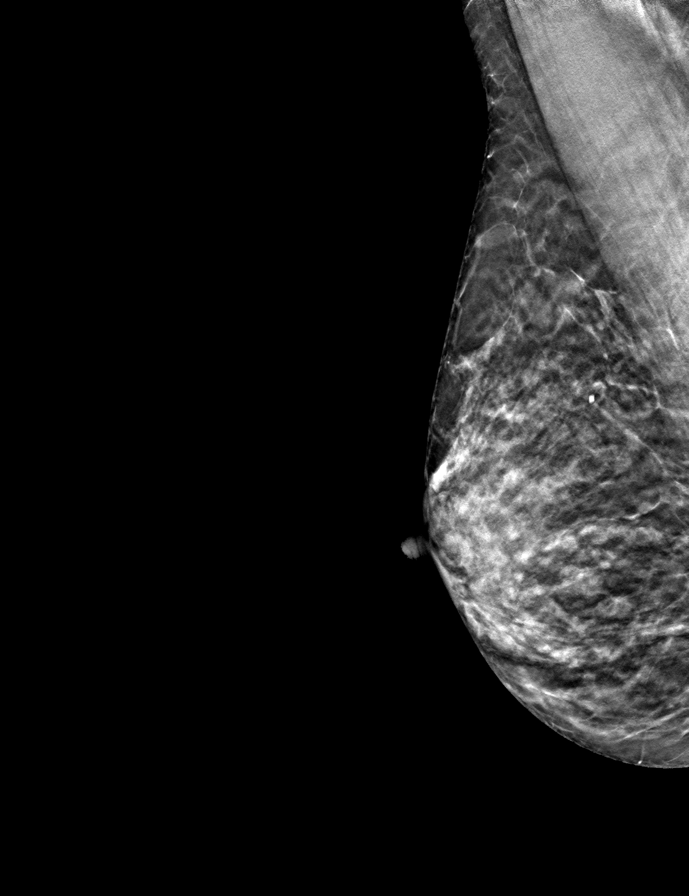

[9 of 24 positions shown; findings below may reference images not displayed]

ACR Breast Density Category c: The breast tissue is heterogeneously
dense, which may obscure small masses.
FINDINGS: There are no findings suspicious for malignancy.
IMPRESSION: No mammographic evidence of malignancy. A result letter of this
screening mammogram will be mailed directly to the patient.

RECOMMENDATION:
Screening mammogram in one year. (Code:Q3-W-BC3)

BI-RADS CATEGORY  1: Negative.

## 2023-02-02 ENCOUNTER — Other Ambulatory Visit: Payer: Self-pay | Admitting: Family Medicine

## 2023-02-02 DIAGNOSIS — N764 Abscess of vulva: Secondary | ICD-10-CM

## 2023-02-06 ENCOUNTER — Encounter: Payer: Self-pay | Admitting: Nurse Practitioner

## 2023-02-08 DIAGNOSIS — Q181 Preauricular sinus and cyst: Secondary | ICD-10-CM | POA: Diagnosis not present

## 2023-02-08 DIAGNOSIS — H02834 Dermatochalasis of left upper eyelid: Secondary | ICD-10-CM | POA: Diagnosis not present

## 2023-02-08 DIAGNOSIS — H02831 Dermatochalasis of right upper eyelid: Secondary | ICD-10-CM | POA: Diagnosis not present

## 2023-02-08 DIAGNOSIS — H02835 Dermatochalasis of left lower eyelid: Secondary | ICD-10-CM | POA: Diagnosis not present

## 2023-02-08 DIAGNOSIS — H02832 Dermatochalasis of right lower eyelid: Secondary | ICD-10-CM | POA: Diagnosis not present

## 2023-02-08 DIAGNOSIS — H6123 Impacted cerumen, bilateral: Secondary | ICD-10-CM | POA: Diagnosis not present

## 2023-02-18 ENCOUNTER — Other Ambulatory Visit: Payer: Self-pay

## 2023-02-18 DIAGNOSIS — Z87891 Personal history of nicotine dependence: Secondary | ICD-10-CM

## 2023-02-18 DIAGNOSIS — Z122 Encounter for screening for malignant neoplasm of respiratory organs: Secondary | ICD-10-CM

## 2023-03-05 NOTE — Progress Notes (Signed)
 GYNECOLOGY  VISIT   HPI: 67 y.o.   Divorced  African American female   [phone] with No LMP recorded. Patient is postmenopausal.   here for: ACUTE NEW GYN/vulvar abscess. Abscess is no longer there but still having pain and scar.  Abscess noted December 09, 2022.  Saw PCP and was treated with Mupirocin  ointment 2% and Augmentin  875/125 mg. Seen in follow up October 18. 2024 and was improved.   Had a large vulvar abscess about 20 years ago and had it drained and then had to pack it with medicated gauze to heal by secondary intention.   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   GYNECOLOGIC HISTORY: No LMP recorded. Patient is postmenopausal. Contraception:  PMP Menopausal hormone therapy:  n/a Last 2 paps:  6 years ago per pt History of abnormal Pap or positive HPV:  no Mammogram:  07/24/22 Breast Density Cat D, BI-RADS CAT 1 neg        OB History     Gravida  3   Para  3   Term  3   Preterm      AB      Living  3      SAB      IAB      Ectopic      Multiple      Live Births  3              Patient Active Problem List   Diagnosis Date Noted   Former smoker 01/15/2023   Abnormal mammogram 07/03/2022   Allergic conjunctivitis 07/03/2022   Allergic rhinitis 07/03/2022   Acute cough 01/16/2022   Encounter for screening for lung cancer 11/20/2021   Neuropathy 11/20/2021   Need for prophylactic vaccination with Streptococcus pneumoniae (Pneumococcus) and Influenza vaccines 11/20/2021   Need for vaccination 11/20/2021   Genetic testing 06/06/2021   Family history of prostate cancer 05/21/2021   Family history of colon cancer 05/21/2021   Allergic conjunctivitis of both eyes 05/16/2021   Encounter for general adult medical examination with abnormal findings 05/16/2021   Prediabetes 05/16/2021   Abdominal pain, LLQ 04/18/2021   FHx: pancreatic cancer 09/05/2019   COVID-19 virus detected 08/25/2018   Osteoporosis 05/27/2017   Vitamin D  deficiency 05/09/2017    Submandibular lymphadenopathy 10/12/2013   Routine health maintenance 04/12/2013   Sinus bradycardia 05/27/2012   Graves disease 05/27/2012   Allergic conjunctivitis and rhinitis 05/27/2012   Rotator cuff arthropathy 05/27/2012   Fibroadenoma of right breast 05/27/2012   Other emphysema (HCC) 05/27/2012   Special screening for malignant neoplasms, colon 05/27/2012    Past Medical History:  Diagnosis Date   Arthritis    Bilateral shoulders.   Bradycardia    Exercise stress test normal 2008   Cyst of ear canal 10/08/2022   Diverticulitis 2012   Emphysema    Last PFTs 1980s, not on current inhaler thearpy   Fibroadenoma of breast 01/2012   R breast   Graves disease 1980s   RAI in 2008, not on any thyroid  replacement hormone   Neutropenia (HCC) 05/16/2021   Osteoarthritis of shoulder    Bilateral arthritis/bursitis   Rotator cuff tear arthropathy    R cuff   Seasonal allergies    SVD (spontaneous vaginal delivery)    x 3   Tubular adenoma of colon 01/2015    Past Surgical History:  Procedure Laterality Date   ABDOMINAL HYSTERECTOMY  1989   BREAST BIOPSY  12/22/2011   Procedure: BREAST BIOPSY;  Surgeon:  Lynda Leos, MD;  Location: [facility] OR;  Service: General;  Laterality: Right;  Excision right breast mass   BREAST EXCISIONAL BIOPSY     CHOLECYSTECTOMY  1989   COLONOSCOPY  01/2015   hx polyps-[facility]   COLONOSCOPY  2019   MS-MAC-prep good (goly)-TA- repeat 3 yrs   EYE SURGERY Left    MULTIPLE TOOTH EXTRACTIONS     POLYPECTOMY  2019   TA   ROTATOR CUFF REPAIR     R shoulder x2 Dr. Dow [facility]   ROTATOR CUFF REPAIR Right    SHOULDER ARTHROSCOPY W/ SUPERIOR LABRAL ANTERIOR POSTERIOR LESION REPAIR Right    bursitis   TUBAL LIGATION      Current Outpatient Medications  Medication Sig Dispense Refill   acetaminophen  (TYLENOL ) 650 MG CR tablet Take 650 mg by mouth every 8 (eight) hours as needed for pain.     albuterol  (VENTOLIN  HFA) 108 (90 Base)  MCG/ACT inhaler Inhale 2 puffs into the lungs every 6 (six) hours as needed for wheezing or shortness of breath. 8 g 0   cetirizine (ZYRTEC) 10 MG chewable tablet Chew 10 mg by mouth daily as needed.     cholecalciferol (VITAMIN D ) 1000 UNITS tablet Take 5,000 Units by mouth 3 (three) times a week.     diclofenac  sodium (VOLTAREN ) 1 % GEL Apply 2 g topically daily as needed. For shoulder pain per patient 6 Tube 1   fluticasone  (FLONASE ) 50 MCG/ACT nasal spray Place 1 spray into both nostrils daily. 16 g 5   gabapentin  (NEURONTIN ) 100 MG capsule Take 1 capsule (100 mg total) by mouth at bedtime. 30 capsule 2   levocetirizine (XYZAL ) 5 MG tablet Take 0.5 tablets (2.5 mg total) by mouth every evening. 90 tablet 1   linaclotide  (LINZESS ) 145 MCG CAPS capsule Take 1 capsule (145 mcg total) by mouth daily as needed. 30 capsule 2   Multiple Vitamins-Minerals (CENTRUM SILVER PO) Take 1 tablet by mouth daily.     mupirocin  ointment (BACTROBAN ) 2 % Apply 1 Application topically 2 (two) times daily. 22 g 0   Olopatadine  HCl 0.2 % SOLN Apply 1 drop to eye in the morning. 2.5 mL 2   polyethylene glycol powder (GLYCOLAX /MIRALAX ) 17 GM/SCOOP powder Take 17 g by mouth 2 (two) times daily as needed. 225 g 5   No current facility-administered medications for this visit.     ALLERGIES: Boniva  [ibandronic acid], Latex, and Sulfa antibiotics  Family History  Problem Relation Age of Onset   Colon polyps Mother 66   Pancreatic cancer Mother 19       pancreatic   Hearing loss Father    Stroke Father 14       ischemic stroke   Prostate cancer Brother 38       with mets   Gastric cancer Maternal Aunt        gastric   Colon polyps Maternal Grandfather 5   Colon cancer Maternal Grandfather 54   Rectal cancer Neg Hx    Stomach cancer Neg Hx    Esophageal cancer Neg Hx     Social History   Socioeconomic History   Marital status: Divorced    Spouse name: Not on file   Number of children: 3   Years of  education: Not on file   Highest education level: Not on file  Occupational History   Occupation: CNA    Comment: [employer] in [employer]  Tobacco Use   Smoking status: Former  Current packs/day: 1.00    Average packs/day: 1 pack/day for 20.0 years (20.0 ttl pk-yrs)    Types: Cigarettes    Start date: 11/08/2011   Smokeless tobacco: Never  Vaping Use   Vaping status: Never Used  Substance and Sexual Activity   Alcohol use: No    Alcohol/week: 0.0 standard drinks of alcohol   Drug use: No   Sexual activity: Not Currently    Birth control/protection: Post-menopausal  Other Topics Concern   Not on file  Social History Narrative   Live in [place], lives alone. Sons nearby.    Social Drivers of Corporate Investment Banker Strain: Low Risk  (05/13/2022)   Overall Financial Resource Strain (CARDIA)    Difficulty of Paying Living Expenses: Not hard at all  Food Insecurity: No Food Insecurity (05/13/2022)   Hunger Vital Sign    Worried About Running Out of Food in the Last Year: Never true    Ran Out of Food in the Last Year: Never true  Transportation Needs: No Transportation Needs (05/13/2022)   PRAPARE - Administrator, Civil Service (Medical): No    Lack of Transportation (Non-Medical): No  Physical Activity: Sufficiently Active (05/13/2022)   Exercise Vital Sign    Days of Exercise per Week: 7 days    Minutes of Exercise per Session: 60 min  Stress: No Stress Concern Present (05/13/2022)   [employer] of Occupational Health - Occupational Stress Questionnaire    Feeling of Stress : Not at all  Social Connections: Moderately Integrated (05/13/2022)   Social Connection and Isolation Panel [NHANES]    Frequency of Communication with Friends and Family: More than three times a week    Frequency of Social Gatherings with Friends and Family: More than three times a week    Attends Religious Services: More than 4 times per year    Active Member of [employer] or Organizations: Yes     Attends Banker Meetings: More than 4 times per year    Marital Status: Divorced  Intimate Partner Violence: Not At Risk (05/13/2022)   Humiliation, Afraid, Rape, and Kick questionnaire    Fear of Current or Ex-Partner: No    Emotionally Abused: No    Physically Abused: No    Sexually Abused: No    Review of Systems  All other systems reviewed and are negative.   PHYSICAL EXAMINATION:   BP 122/84 (BP Location: Right Arm, Patient Position: Sitting, Cuff Size: Small)   Ht 5' 5.5 (1.664 m)   Wt 133 lb (60.3 kg)   BMI 21.80 kg/m     General appearance: alert, cooperative and appears stated age   Abdomen: soft, non-tender, no masses,  no organomegaly Extremities: extremities normal, atraumatic, no cyanosis or edema   No abnormal inguinal nodes palpated   Pelvic: External genitalia:  right labia majora with 7 mm sebaceous cyst.   Multiple tiny sebaceous cysts of the labia.  No erythema.  No fluctuance.               Urethra:  normal appearing urethra with no masses, tenderness or lesions              Bartholins and Skenes: normal                 Vagina: normal appearing vagina with normal color and discharge, no lesions              Cervix: absent  Bimanual Exam:  Uterus:  absent              Adnexa: no mass, fullness, tenderness         Chaperone was present for exam:  Damien FALCON, CMA  ASSESSMENT:  Hx abdominal hysterectomy for fibroids.  Ovaries remain.  Hx vulvar abscess. Sebaceous cysts of the vulva.  No sign of infection today.   PLAN:  We discussed sebaceous cysts and I gave patient reassurance that it does not need drainage, treatment, or removal.  Ok for routine hygiene.  Return for recurrent abscess.  Return for routine breast and pelvic exam in about 5 months.  Mammogram due in May, 2025.

## 2023-03-10 DIAGNOSIS — B009 Herpesviral infection, unspecified: Secondary | ICD-10-CM

## 2023-03-10 HISTORY — DX: Herpesviral infection, unspecified: B00.9

## 2023-03-18 ENCOUNTER — Ambulatory Visit (INDEPENDENT_AMBULATORY_CARE_PROVIDER_SITE_OTHER): Payer: Medicare HMO | Admitting: Obstetrics and Gynecology

## 2023-03-18 ENCOUNTER — Encounter: Payer: Self-pay | Admitting: Obstetrics and Gynecology

## 2023-03-18 VITALS — BP 122/84 | Ht 65.5 in | Wt 133.0 lb

## 2023-03-18 DIAGNOSIS — L723 Sebaceous cyst: Secondary | ICD-10-CM

## 2023-03-18 NOTE — Patient Instructions (Signed)
 Epidermoid Cyst  An epidermoid cyst, also known as epidermal cyst, is a sac made of skin tissue. The sac contains a substance called keratin. Keratin is a protein that is normally secreted through the hair follicles. When keratin becomes trapped in the top layer of skin (epidermis), it can form an epidermoid cyst. Epidermoid cysts can be found anywhere on your body. These cysts are usually harmless (benign), and they may not cause symptoms unless they become inflamed or infected. What are the causes? This condition may be caused by: A blocked hair follicle. A hair that curls and re-enters the skin instead of growing straight out of the skin (ingrown hair). A blocked pore. Irritated skin. An injury to the skin. Certain conditions that are passed along from parent to child (inherited). Human papillomavirus (HPV). This happens rarely when cysts occur on the bottom of the feet. Long-term (chronic) sun damage to the skin. What increases the risk? The following factors may make you more likely to develop an epidermoid cyst: Having acne. Being female. Having an injury to the skin. Being past puberty. Having certain rare genetic disorders. What are the signs or symptoms? The only symptom of this condition may be a small, painless lump underneath the skin. When an epidermal cyst ruptures, it may become inflamed. True infection in cysts is rare. Symptoms may include: Redness. Inflammation. Tenderness. Warmth. Keratin draining from the cyst. Keratin is grayish-white, bad-smelling substance. Pus draining from the cyst. How is this diagnosed? This condition is diagnosed with a physical exam. In some cases, you may have a sample of tissue (biopsy) taken from your cyst to be examined under a microscope or tested for bacteria. You may be referred to a health care provider who specializes in skin care (dermatologist). How is this treated? If a cyst becomes inflamed, treatment may include: Opening and  draining the cyst, done by a health care provider. After draining, minor surgery to remove the rest of the cyst may be done. Taking antibiotic medicine. Having injections of medicines (steroids) that help to reduce inflammation. Having surgery to remove the cyst. Surgery may be done if the cyst: Becomes large. Bothers you. Has a chance of turning into cancer. Do not try to open a cyst yourself. Follow these instructions at home: Medicines If you were prescribed an antibiotic medicine, take it it as told by your health care provider. Do not stop using the antibiotic even if you start to feel better. Take over-the-counter and prescription medicines only as told by your health care provider. General instructions Keep the area around your cyst clean and dry. Wear loose, dry clothing. Avoid touching your cyst. Check your cyst every day for signs of infection. Check for: Redness, swelling, or pain. Fluid or blood. Warmth. Pus or a bad smell. Keep all follow-up visits. This is important. How is this prevented? Wear clean, dry, clothing. Avoid wearing tight clothing. Keep your skin clean and dry. Take showers or baths every day. Contact a health care provider if: Your cyst develops symptoms of infection. Your condition is not improving or is getting worse. You develop a cyst that looks different from other cysts you have had. You have a fever. Get help right away if: Redness spreads from the cyst into the surrounding area. Summary An epidermoid cyst is a sac made of skin tissue. These cysts are usually harmless (benign), and they may not cause symptoms unless they become inflamed. If a cyst becomes inflamed, treatment may include surgery to open and drain the  cyst, or to remove it. Treatment may also include medicines by mouth or through an injection. Take over-the-counter and prescription medicines only as told by your health care provider. If you were prescribed an antibiotic medicine,  take it as told by your health care provider. Do not stop using the antibiotic even if you start to feel better. Contact a health care provider if your condition is not improving or is getting worse. Keep all follow-up visits as told by your health care provider. This is important. This information is not intended to replace advice given to you by your health care provider. Make sure you discuss any questions you have with your health care provider. Document Revised: 05/30/2019 Document Reviewed: 05/31/2019 Elsevier Patient Education  2024 [employer].

## 2023-05-17 ENCOUNTER — Ambulatory Visit (INDEPENDENT_AMBULATORY_CARE_PROVIDER_SITE_OTHER): Payer: PPO

## 2023-05-17 VITALS — Ht 65.0 in | Wt 133.0 lb

## 2023-05-17 DIAGNOSIS — Z1211 Encounter for screening for malignant neoplasm of colon: Secondary | ICD-10-CM | POA: Diagnosis not present

## 2023-05-17 DIAGNOSIS — D123 Benign neoplasm of transverse colon: Secondary | ICD-10-CM

## 2023-05-17 DIAGNOSIS — Z0000 Encounter for general adult medical examination without abnormal findings: Secondary | ICD-10-CM

## 2023-05-17 DIAGNOSIS — Z122 Encounter for screening for malignant neoplasm of respiratory organs: Secondary | ICD-10-CM

## 2023-05-17 DIAGNOSIS — Z87891 Personal history of nicotine dependence: Secondary | ICD-10-CM

## 2023-05-17 NOTE — Progress Notes (Addendum)
 Subjective:   Please attest and cosign this visit due to patients primary care provider not being in the office at the time the visit was completed.  (Pt of Brandi Paddy, NP)   [name] is a 68 y.o. who presents for a Medicare Wellness preventive visit.  Visit Complete: Virtual I connected with  Brandi Stevens on 05/17/23 by a audio enabled telemedicine application and verified that I am speaking with the correct person using two identifiers.  Patient Location: Home  Provider Location: Office/Clinic  I discussed the limitations of evaluation and management by telemedicine. The patient expressed understanding and agreed to proceed.  Vital Signs: Because this visit was a virtual/telehealth visit, some criteria may be missing or patient reported. Any vitals not documented were not able to be obtained and vitals that have been documented are patient reported.  VideoDeclined- This patient declined Librarian, academic. Therefore the visit was completed with audio only.  AWV Questionnaire: Yes: Patient Medicare AWV questionnaire was completed by the patient on 05/15/2023; I have confirmed that all information answered by patient is correct and no changes since this date.  Cardiac Risk Factors include: advanced age (>56men, >2 women);smoking/ tobacco exposure (Dx-Pre Diabetes)     Objective:    Today's Vitals   05/17/23 1252  Weight: 133 lb (60.3 kg)  Height: 5\' 5"  (1.651 m)   Body mass index is 22.13 kg/m.     05/17/2023   12:50 PM 05/13/2022    2:06 PM 11/01/2013    4:05 PM 10/12/2013    3:53 PM 12/16/2011    8:27 AM  Advanced Directives  Does Patient Have a Medical Advance Directive? No No No Patient does not have advance directive;Patient would not like information   Does patient want to make changes to medical advance directive?   Yes - information given    Would patient like information on creating a medical advance directive? Yes  (MAU/Ambulatory/Procedural Areas - Information given) No - Patient declined     Pre-existing out of facility DNR order (yellow form or pink MOST form)     No    Current Medications (verified) Outpatient Encounter Medications as of 05/17/2023  Medication Sig   acetaminophen (TYLENOL) 650 MG CR tablet Take 650 mg by mouth every 8 (eight) hours as needed for pain.   albuterol (VENTOLIN HFA) 108 (90 Base) MCG/ACT inhaler Inhale 2 puffs into the lungs every 6 (six) hours as needed for wheezing or shortness of breath.   cetirizine (ZYRTEC) 10 MG chewable tablet Chew 10 mg by mouth daily as needed.   cholecalciferol (VITAMIN D) 1000 UNITS tablet Take 5,000 Units by mouth 3 (three) times a week.   diclofenac sodium (VOLTAREN) 1 % GEL Apply 2 g topically daily as needed. For shoulder pain per patient   FLUAD 0.5 ML injection Inject 0.5 mLs into the muscle once.   fluticasone (FLONASE) 50 MCG/ACT nasal spray Place 1 spray into both nostrils daily.   gabapentin (NEURONTIN) 100 MG capsule Take 1 capsule (100 mg total) by mouth at bedtime.   levocetirizine (XYZAL) 5 MG tablet Take 0.5 tablets (2.5 mg total) by mouth every evening.   linaclotide (LINZESS) 145 MCG CAPS capsule Take 1 capsule (145 mcg total) by mouth daily as needed.   Multiple Vitamins-Minerals (CENTRUM SILVER PO) Take 1 tablet by mouth daily.   mupirocin ointment (BACTROBAN) 2 % Apply 1 Application topically 2 (two) times daily.   Olopatadine HCl 0.2 % SOLN Apply  1 drop to eye in the morning.   polyethylene glycol powder (GLYCOLAX/MIRALAX) 17 GM/SCOOP powder Take 17 g by mouth 2 (two) times daily as needed.   SHINGRIX injection Inject 0.5 mLs into the muscle once.   No facility-administered encounter medications on file as of 05/17/2023.    Allergies (verified) Boniva [ibandronate], Latex, and Sulfa antibiotics   History: Past Medical History:  Diagnosis Date   Arthritis    Bilateral shoulders.   Bradycardia    Exercise stress  test normal 2008   Cyst of ear canal 10/08/2022   Diverticulitis 2012   Emphysema    Last PFTs 1980s, not on current inhaler thearpy   Fibroadenoma of breast 01/2012   R breast   Graves disease 1980s   RAI in 2008, not on any thyroid replacement hormone   Neutropenia (HCC) 05/16/2021   Osteoarthritis of shoulder    Bilateral arthritis/bursitis   Rotator cuff tear arthropathy    R cuff   Seasonal allergies    SVD (spontaneous vaginal delivery)    x 3   Tubular adenoma of colon 01/2015   Past Surgical History:  Procedure Laterality Date   ABDOMINAL HYSTERECTOMY  1989   BREAST BIOPSY  12/22/2011   Procedure: BREAST BIOPSY;  Surgeon: Axel Filler, MD;  Location: [facility] OR;  Service: General;  Laterality: Right;  Excision right breast mass   BREAST EXCISIONAL BIOPSY     CHOLECYSTECTOMY  1989   COLONOSCOPY  01/2015   hx polyps-[facility]   COLONOSCOPY  2019   MS-MAC-prep good (goly)-TA- repeat 3 yrs   EYE SURGERY Left    MULTIPLE TOOTH EXTRACTIONS     POLYPECTOMY  2019   TA   ROTATOR CUFF REPAIR     R shoulder x2 Dr. Noralyn Pick at [facility]   ROTATOR CUFF REPAIR Right    SHOULDER ARTHROSCOPY W/ SUPERIOR LABRAL ANTERIOR POSTERIOR LESION REPAIR Right    bursitis   TUBAL LIGATION     Family History  Problem Relation Age of Onset   Colon polyps Mother 28   Pancreatic cancer Mother 20       pancreatic   Hearing loss Father    Stroke Father 51       ischemic stroke   Prostate cancer Brother 51       with mets   Gastric cancer Maternal Aunt        gastric   Colon polyps Maternal Grandfather 68   Colon cancer Maternal Grandfather 65   Rectal cancer Neg Hx    Stomach cancer Neg Hx    Esophageal cancer Neg Hx    Social History   Socioeconomic History   Marital status: Divorced    Spouse name: Not on file   Number of children: 3   Years of education: Not on file   Highest education level: Not on file  Occupational History   Occupation: CNA    Comment: [employer]  [employer]  Tobacco Use   Smoking status: Every Day    Current packs/day: 1.00    Average packs/day: 1 pack/day for 47.2 years (47.2 ttl pk-yrs)    Types: Cigarettes    Start date: 03/09/1976    Passive exposure: Current   Smokeless tobacco: Never   Tobacco comments:    Pt has not quit smoking - still smoking as of 05/17/23.    Vaping Use   Vaping status: Never Used  Substance and Sexual Activity   Alcohol use: No    Alcohol/week: 0.0  standard drinks of alcohol   Drug use: No   Sexual activity: Not Currently    Birth control/protection: Post-menopausal  Other Topics Concern   Not on file  Social History Narrative   Live in [place], lives alone. Sons nearby.    Social Drivers of Corporate investment banker Strain: Low Risk  (05/17/2023)   Overall Financial Resource Strain (CARDIA)    Difficulty of Paying Living Expenses: Not hard at all  Food Insecurity: No Food Insecurity (05/17/2023)   Hunger Vital Sign    Worried About Running Out of Food in the Last Year: Never true    Ran Out of Food in the Last Year: Never true  Transportation Needs: No Transportation Needs (05/17/2023)   PRAPARE - Administrator, Civil Service (Medical): No    Lack of Transportation (Non-Medical): No  Physical Activity: Sufficiently Active (05/17/2023)   Exercise Vital Sign    Days of Exercise per Week: 5 days    Minutes of Exercise per Session: 40 min  Stress: No Stress Concern Present (05/17/2023)   [employer] of Occupational Health - Occupational Stress Questionnaire    Feeling of Stress : Not at all  Social Connections: Moderately Integrated (05/17/2023)   Social Connection and Isolation Panel [NHANES]    Frequency of Communication with Friends and Family: More than three times a week    Frequency of Social Gatherings with Friends and Family: More than three times a week    Attends Religious Services: More than 4 times per year    Active Member of [employer] or Organizations: Yes     Attends Engineer, structural: More than 4 times per year    Marital Status: Divorced    Tobacco Counseling - Current Smoker Counseling given - Yes - Pt never quit smoking.  Ordered a Lung Cancer Screening test.    Clinical Intake:  Pre-visit preparation completed: Yes  Pain : No/denies pain     BMI - recorded: 22.13 Nutritional Status: BMI of 19-24  Normal Nutritional Risks: None Diabetes: No  How often do you need to have someone help you when you read instructions, pamphlets, or other written materials from your doctor or pharmacy?: 1 - Never  Interpreter Needed?: No  Information entered by :: Hassell Halim, CMA   Activities of Daily Living     05/17/2023    1:00 PM 05/15/2023    5:41 PM  In your present state of health, do you have any difficulty performing the following activities:  Hearing? 0 0  Vision? 0 0  Difficulty concentrating or making decisions? 0 0  Walking or climbing stairs? 0 0  Dressing or bathing? 0 0  Doing errands, shopping? 0 0  Preparing Food and eating ? N N  Using the Toilet? N N  In the past six months, have you accidently leaked urine? Y Y  Do you have problems with loss of bowel control? N N  Managing your Medications? N N  Managing your Finances? N N  Housekeeping or managing your Housekeeping? N N    Patient Care Team: Brandi Paddy, NP as PCP - General (Nurse Practitioner) Jethro Bolus, MD as Consulting Physician (Ophthalmology) Eber Jones, MD as Referring Physician (Ophthalmology)  Indicate any recent Medical Services you may have received from other than [facility] providers in the past year (date may be approximate).     Assessment:   This is a routine wellness examination for Dymphna.  Hearing/Vision screen Hearing Screening -  Comments:: Denies hearing difficulties   Vision Screening - Comments:: Wears rx glasses - up to date with routine eye exams with Dr Maryruth Bun   Goals Addressed               This Visit's  Progress     Patient Stated (pt-stated)        Patient stated that she wants to manage her blood sugar levels and stay active.       Depression Screen     05/17/2023    1:04 PM 12/09/2022   11:16 AM 07/03/2022    8:13 AM 05/13/2022    2:17 PM 01/01/2022    8:06 AM 11/20/2021    8:14 AM 05/16/2021    8:03 AM  PHQ 2/9 Scores  PHQ - 2 Score 0 0 0 0 0 0 0  PHQ- 9 Score 0  0 0 0 0     Fall Risk     05/17/2023    1:01 PM 05/15/2023    5:41 PM 12/09/2022   11:15 AM 07/03/2022    8:12 AM 05/13/2022    2:09 PM  Fall Risk   Falls in the past year? 0 0 0 0 0  Number falls in past yr: 0  0 0 0  Injury with Fall? 0  0 0 0  Risk for fall due to : No Fall Risks  No Fall Risks No Fall Risks No Fall Risks  Follow up Falls evaluation completed;Falls prevention discussed  Falls evaluation completed Falls evaluation completed Falls prevention discussed    MEDICARE RISK AT HOME:  Medicare Risk at Home Any stairs in or around the home?: Yes (outside) If so, are there any without handrails?: No Home free of loose throw rugs in walkways, pet beds, electrical cords, etc?: Yes Adequate lighting in your home to reduce risk of falls?: Yes Life alert?: No Use of a cane, walker or w/c?: No Grab bars in the bathroom?: No Shower chair or bench in shower?: No Elevated toilet seat or a handicapped toilet?: No  TIMED UP AND GO:  Was the test performed?  No  Cognitive Function: 6CIT completed        05/17/2023    1:01 PM 05/13/2022    2:19 PM  6CIT Screen  What Year? 0 points 0 points  What month? 0 points 0 points  What time? 0 points 0 points  Count back from 20 0 points 0 points  Months in reverse 0 points 0 points  Repeat phrase 0 points 0 points  Total Score 0 points 0 points    Immunizations Immunization History  Administered Date(s) Administered   Fluad Quad(high Dose 65+) 11/20/2021   Influenza,inj,Quad PF,6+ Mos 11/06/2014, 03/19/2017, 12/16/2017, 11/09/2018   Influenza-Unspecified  11/27/2020, 12/20/2021   Moderna Covid-19 Vaccine Bivalent Booster 44yrs & up 12/19/2020, 12/25/2021   PFIZER(Purple Top)SARS-COV-2 Vaccination 05/02/2019, 05/23/2019, 12/24/2019   PNEUMOCOCCAL CONJUGATE-20 11/20/2021   Rsv, Bivalent, Protein Subunit Rsvpref,pf Verdis Frederickson) 12/03/2021   Tdap 03/09/2012    Screening Tests Health Maintenance  Topic Date Due   DTaP/Tdap/Td (2 - Td or Tdap) 03/09/2022   COVID-19 Vaccine (6 - 2024-25 season) 11/08/2022   INFLUENZA VACCINE  06/07/2023 (Originally 10/08/2022)   Lung Cancer Screening  01/27/2024   Colonoscopy  02/21/2024   Medicare Annual Wellness (AWV)  05/16/2024   MAMMOGRAM  07/23/2024   Pneumonia Vaccine 26+ Years old  Completed   DEXA SCAN  Completed   Hepatitis C Screening  Completed   HPV VACCINES  Aged Out   Zoster Vaccines- Shingrix  Discontinued    Health Maintenance  Health Maintenance Due  Topic Date Due   DTaP/Tdap/Td (2 - Td or Tdap) 03/09/2022   COVID-19 Vaccine (6 - 2024-25 season) 11/08/2022   Health Maintenance Items Addressed:05/17/2023 Referral sent to GI for colonoscopy, Lung Cancer Screening ordered  on 05/17/2023  Additional Screening:  Vision Screening: Recommended annual ophthalmology exams for early detection of glaucoma and other disorders of the eye. Pt stated she has her routine eye exam with Dr Maryruth Bun yearly.  Dental Screening: Recommended annual dental exams for proper oral hygiene  Community Resource Referral / Chronic Care Management: CRR required this visit?  No   CCM required this visit?  No     Plan:     I have personally reviewed and noted the following in the patient's chart:   Medical and social history Use of alcohol, tobacco or illicit drugs  Current medications and supplements including opioid prescriptions. Patient is not currently taking opioid prescriptions. Functional ability and status Nutritional status Physical activity Advanced directives List of other  physicians Hospitalizations, surgeries, and ER visits in previous 12 months Vitals Screenings to include cognitive, depression, and falls Referrals and appointments  In addition, I have reviewed and discussed with patient certain preventive protocols, quality metrics, and best practice recommendations. A written personalized care plan for preventive services as well as general preventive health recommendations were provided to patient.     Darreld Mclean, CMA   05/17/2023   After Visit Summary: (MyChart) Due to this being a telephonic visit, the after visit summary with patients personalized plan was offered to patient via MyChart   Notes: Please refer to Routing Comments.

## 2023-05-17 NOTE — Patient Instructions (Addendum)
 Brandi Stevens , Thank you for taking time to come for your Medicare Wellness Visit. I appreciate your ongoing commitment to your health goals. Please review the following plan we discussed and let me know if I can assist you in the future.   Referrals/Orders/Follow-Ups/Clinician Recommendations: Aim for 30 minutes of exercise or brisk walking, 6-8 glasses of water, and 5 servings of fruits and vegetables each day. Referral for repeat Colonoscopy and Lung Cancer Screening test.  This is a list of the screening recommended for you and due dates:  Health Maintenance  Topic Date Due   DTaP/Tdap/Td vaccine (2 - Td or Tdap) 03/09/2022   COVID-19 Vaccine (6 - 2024-25 season) 11/08/2022   Flu Shot  06/07/2023*   Screening for Lung Cancer  01/27/2024   Colon Cancer Screening  02/21/2024   Medicare Annual Wellness Visit  05/16/2024   Mammogram  07/23/2024   Pneumonia Vaccine  Completed   DEXA scan (bone density measurement)  Completed   Hepatitis C Screening  Completed   HPV Vaccine  Aged Out   Zoster (Shingles) Vaccine  Discontinued  *Topic was postponed. The date shown is not the original due date.    Advanced directives: (Provided) Advance directive discussed with you today. I have provided a copy for you to complete at home and have notarized. Once this is complete, please bring a copy in to our office so we can scan it into your chart.   Next Medicare Annual Wellness Visit scheduled for next year: Yes - 05/2024

## 2023-05-18 ENCOUNTER — Other Ambulatory Visit: Payer: Self-pay | Admitting: Nurse Practitioner

## 2023-05-18 DIAGNOSIS — Z1231 Encounter for screening mammogram for malignant neoplasm of breast: Secondary | ICD-10-CM

## 2023-07-12 ENCOUNTER — Other Ambulatory Visit (INDEPENDENT_AMBULATORY_CARE_PROVIDER_SITE_OTHER)

## 2023-07-12 DIAGNOSIS — R7303 Prediabetes: Secondary | ICD-10-CM

## 2023-07-12 DIAGNOSIS — E0500 Thyrotoxicosis with diffuse goiter without thyrotoxic crisis or storm: Secondary | ICD-10-CM | POA: Diagnosis not present

## 2023-07-12 DIAGNOSIS — E559 Vitamin D deficiency, unspecified: Secondary | ICD-10-CM | POA: Diagnosis not present

## 2023-07-12 LAB — CBC
HCT: 42.9 % (ref 36.0–46.0)
Hemoglobin: 14 g/dL (ref 12.0–15.0)
MCHC: 32.5 g/dL (ref 30.0–36.0)
MCV: 92.4 fl (ref 78.0–100.0)
Platelets: 290 10*3/uL (ref 150.0–400.0)
RBC: 4.65 Mil/uL (ref 3.87–5.11)
RDW: 13.4 % (ref 11.5–15.5)
WBC: 4.1 10*3/uL (ref 4.0–10.5)

## 2023-07-12 LAB — COMPREHENSIVE METABOLIC PANEL WITH GFR
ALT: 8 U/L (ref 0–35)
AST: 12 U/L (ref 0–37)
Albumin: 4.4 g/dL (ref 3.5–5.2)
Alkaline Phosphatase: 74 U/L (ref 39–117)
BUN: 8 mg/dL (ref 6–23)
CO2: 27 meq/L (ref 19–32)
Calcium: 9.5 mg/dL (ref 8.4–10.5)
Chloride: 107 meq/L (ref 96–112)
Creatinine, Ser: 0.77 mg/dL (ref 0.40–1.20)
GFR: 80.14 mL/min (ref >60.00)
Glucose, Bld: 103 mg/dL — ABNORMAL HIGH (ref 70–99)
Potassium: 4 meq/L (ref 3.5–5.1)
Sodium: 143 meq/L (ref 135–145)
Total Bilirubin: 0.5 mg/dL (ref 0.2–1.2)
Total Protein: 7.2 g/dL (ref 6.0–8.3)

## 2023-07-12 LAB — T3, FREE: T3, Free: 4.5 pg/mL — ABNORMAL HIGH (ref 2.3–4.2)

## 2023-07-12 LAB — LIPID PANEL
Cholesterol: 178 mg/dL (ref 0–200)
HDL: 53.4 mg/dL (ref >39.00)
LDL Cholesterol: 108 mg/dL — ABNORMAL HIGH (ref 0–99)
NonHDL: 124.34
Total CHOL/HDL Ratio: 3
Triglycerides: 84 mg/dL (ref 0.0–149.0)
VLDL: 16.8 mg/dL (ref 0.0–40.0)

## 2023-07-12 LAB — HEMOGLOBIN A1C: Hgb A1c MFr Bld: 6 % (ref 4.6–6.5)

## 2023-07-12 LAB — T4, FREE: Free T4: 0.67 ng/dL (ref 0.60–1.60)

## 2023-07-12 LAB — TSH: TSH: 5.23 u[IU]/mL (ref 0.35–5.50)

## 2023-07-12 LAB — VITAMIN D 25 HYDROXY (VIT D DEFICIENCY, FRACTURES): VITD: 26.11 ng/mL — ABNORMAL LOW (ref 30.00–100.00)

## 2023-07-15 ENCOUNTER — Ambulatory Visit: Payer: PPO | Admitting: Nurse Practitioner

## 2023-07-16 ENCOUNTER — Ambulatory Visit: Payer: PPO | Admitting: Nurse Practitioner

## 2023-07-16 ENCOUNTER — Encounter: Payer: Self-pay | Admitting: Nurse Practitioner

## 2023-07-19 ENCOUNTER — Encounter (HOSPITAL_COMMUNITY): Payer: Self-pay

## 2023-07-26 ENCOUNTER — Ambulatory Visit
Admission: RE | Admit: 2023-07-26 | Discharge: 2023-07-26 | Disposition: A | Discharge status: Home / Self Care | Source: Ambulatory Visit | Attending: Nurse Practitioner | Admitting: Nurse Practitioner

## 2023-07-26 DIAGNOSIS — Z1231 Encounter for screening mammogram for malignant neoplasm of breast: Secondary | ICD-10-CM

## 2023-07-30 ENCOUNTER — Other Ambulatory Visit: Payer: Self-pay | Admitting: Nurse Practitioner

## 2023-07-30 DIAGNOSIS — R928 Other abnormal and inconclusive findings on diagnostic imaging of breast: Secondary | ICD-10-CM

## 2023-08-05 ENCOUNTER — Telehealth: Payer: Self-pay | Admitting: Nurse Practitioner

## 2023-08-05 NOTE — Telephone Encounter (Signed)
 Patient was rescheduled due to provider going on maternity leave. She would like to know if Adella Agee would like to get her cholesterol and T3/T4 tested in case she needs a referral prior to Adella Agee leaving. Best callback is [phone].

## 2023-08-06 ENCOUNTER — Other Ambulatory Visit: Payer: Self-pay | Admitting: Nurse Practitioner

## 2023-08-06 DIAGNOSIS — R7989 Other specified abnormal findings of blood chemistry: Secondary | ICD-10-CM

## 2023-08-06 DIAGNOSIS — E785 Hyperlipidemia, unspecified: Secondary | ICD-10-CM

## 2023-08-12 NOTE — Progress Notes (Signed)
 68 y.o. G24P3003 Divorced [place] American female here for a breast and pelvic exam.    The patient is also followed for sebaceous cyst. Notes itching.  No pain.    Hx toe fractures last summer.   PCP: Zorita Hiss, NP   No LMP recorded. Patient is postmenopausal.           Sexually active: No.  The current method of family planning is post menopausal status.    Menopausal hormone therapy:  n/a Exercising: Yes.    Walking and still working Smoker:  yes  OB History     Gravida  3   Para  3   Term  3   Preterm      AB      Living  3      SAB      IAB      Ectopic      Multiple      Live Births  3           HEALTH MAINTENANCE: Last 2 paps: years ago per patient  History of abnormal Pap or positive HPV:  no Mammogram:  07/26/23 Breast density Cat C, incomplete need additional imaging  Colonoscopy:  02/20/21 Bone Density:  10/31/19  Result  osteoporotic - spine and hip.  Did not tolerate Boniva .  Immunization History  Administered Date(s) Administered   Fluad Quad(high Dose 65+) 11/20/2021   Influenza,inj,Quad PF,6+ Mos 11/06/2014, 03/19/2017, 12/16/2017, 11/09/2018   Influenza-Unspecified 11/27/2020, 12/20/2021   Moderna Covid-19 Vaccine Bivalent Booster 12yrs & up 12/19/2020, 12/25/2021   PFIZER(Purple Top)SARS-COV-2 Vaccination 05/02/2019, 05/23/2019, 12/24/2019   PNEUMOCOCCAL CONJUGATE-20 11/20/2021   Rsv, Bivalent, Protein Subunit Rsvpref,pf Pattricia Bores) 12/03/2021   Tdap 05/18/2023   Zoster Recombinant(Shingrix) 12/29/2022, 05/10/2023      reports that she has been smoking cigarettes. She started smoking about 47 years ago. She has a 47.4 pack-year smoking history. She has been exposed to tobacco smoke. She has never used smokeless tobacco. She reports that she does not drink alcohol and does not use drugs.  Past Medical History:  Diagnosis Date   Arthritis    Bilateral shoulders.   Bradycardia    Exercise stress test normal 2008   Cyst of  ear canal 10/08/2022   Diverticulitis 2012   Emphysema    Last PFTs 1980s, not on current inhaler thearpy   Fibroadenoma of breast 01/2012   R breast   Graves disease 1980s   RAI in 2008, not on any thyroid  replacement hormone   Neutropenia (HCC) 05/16/2021   Osteoarthritis of shoulder    Bilateral arthritis/bursitis   Rotator cuff tear arthropathy    R cuff   Seasonal allergies    SVD (spontaneous vaginal delivery)    x 3   Tubular adenoma of colon 01/2015    Past Surgical History:  Procedure Laterality Date   ABDOMINAL HYSTERECTOMY  1989   BREAST BIOPSY  12/22/2011   Procedure: BREAST BIOPSY;  Surgeon: Shela Derby, MD;  Location: [facility] OR;  Service: General;  Laterality: Right;  Excision right breast mass   BREAST EXCISIONAL BIOPSY     CHOLECYSTECTOMY  1989   COLONOSCOPY  01/2015   hx polyps-[facility]   COLONOSCOPY  2019   MS-MAC-prep good (goly)-TA- repeat 3 yrs   EYE SURGERY Left    MULTIPLE TOOTH EXTRACTIONS     POLYPECTOMY  2019   TA   ROTATOR CUFF REPAIR     R shoulder [facility]2 Dr[facility]  ROTATOR CUFF REPAIR Right    SHOULDER ARTHROSCOPY W/ SUPERIOR LABRAL ANTERIOR POSTERIOR LESION REPAIR Right    bursitis   TUBAL LIGATION      Current Outpatient Medications  Medication Sig Dispense Refill   acetaminophen  (TYLENOL ) 650 MG CR tablet Take 650 mg by mouth every 8 (eight) hours as needed for pain.     albuterol  (VENTOLIN  HFA) 108 (90 Base) MCG/ACT inhaler Inhale 2 puffs into the lungs every 6 (six) hours as needed for wheezing or shortness of breath. 8 g 0   cetirizine (ZYRTEC) 10 MG chewable tablet Chew 10 mg by mouth daily as needed.     cholecalciferol (VITAMIN D ) 1000 UNITS tablet Take 5,000 Units by mouth 3 (three) times a week.     diclofenac  sodium (VOLTAREN ) 1 % GEL Apply 2 g topically daily as needed. For shoulder pain per patient 6 Tube 1   fluticasone  (FLONASE ) 50 MCG/ACT nasal spray Place 1 spray into both nostrils daily. 16 g 5    gabapentin  (NEURONTIN ) 100 MG capsule Take 1 capsule (100 mg total) by mouth at bedtime. 30 capsule 2   levocetirizine (XYZAL ) 5 MG tablet Take 0.5 tablets (2.5 mg total) by mouth every evening. 90 tablet 1   linaclotide  (LINZESS ) 145 MCG CAPS capsule Take 1 capsule (145 mcg total) by mouth daily as needed. 30 capsule 2   Multiple Vitamins-Minerals (CENTRUM SILVER PO) Take 1 tablet by mouth daily.     mupirocin  ointment (BACTROBAN ) 2 % Apply 1 Application topically 2 (two) times daily. 22 g 0   Olopatadine  HCl 0.2 % SOLN Apply 1 drop to eye in the morning. 2.5 mL 2   polyethylene glycol powder (GLYCOLAX /MIRALAX ) 17 GM/SCOOP powder Take 17 g by mouth 2 (two) times daily as needed. 225 g 5   SHINGRIX injection Inject 0.5 mLs into the muscle once.     No current facility-administered medications for this visit.    ALLERGIES: Boniva  [ibandronate ], Latex, and Sulfa antibiotics  Family History  Problem Relation Age of Onset   Colon polyps Mother 31   Pancreatic cancer Mother 74       pancreatic   Hearing loss Father    Stroke Father 3       ischemic stroke   Prostate cancer Brother 10       with mets   Gastric cancer Maternal Aunt        gastric   Colon polyps Maternal Grandfather 110   Colon cancer Maternal Grandfather 57   Rectal cancer Neg Hx    Stomach cancer Neg Hx    Esophageal cancer Neg Hx     Review of Systems  All other systems reviewed and are negative.   PHYSICAL EXAM:  BP 118/68 (BP Location: Right Arm, Patient Position: Sitting)   Pulse 60   Ht 5\' 5"  (1.651 m)   Wt 130 lb (59 kg)   SpO2 100%   BMI 21.63 kg/m     General appearance: alert, cooperative and appears stated age Head: normocephalic, without obvious abnormality, atraumatic Neck: no adenopathy, supple, symmetrical, trachea midline and thyroid  normal to inspection and palpation Lungs: clear to auscultation bilaterally Breasts: normal appearance, no masses or tenderness, No nipple retraction or  dimpling, No nipple discharge or bleeding, No axillary adenopathy Heart: regular rate and rhythm Abdomen: soft, non-tender; no masses, no organomegaly Extremities: extremities normal, atraumatic, no cyanosis or edema Skin: skin color, texture, turgor normal. No rashes or lesions Lymph nodes: cervical, supraclavicular, and axillary nodes  normal. Neurologic: grossly normal  Pelvic: External genitalia:  no lesions.  Right labia majora with multiple sebaceous cysts.  No erythema or drainage.               No abnormal inguinal nodes palpated.              Urethra:  normal appearing urethra with no masses, tenderness or lesions              Bartholins and Skenes: normal                 Vagina: normal appearing vagina with normal color and discharge, no lesions              Cervix: absent              Pap taken: no Bimanual Exam:  Uterus:  absent              Adnexa: no mass, fullness, tenderness              Rectal exam: yes.  Confirms.              Anus:  normal sphincter tone, no lesions  Chaperone was present for exam:  Cottie Diss, CMA  ASSESSMENT: Encounter for breast and pelvic exam.  Personal history of other medical treatment.  Hx abdominal hysterectomy for fibroids.  Ovaries remain.  Hx vulvar abscess. Sebaceous cysts of the vulva.  No sign of infection.  Osteoporosis of hip and spine.  Intolerant of Boniva .   PLAN: Mammogram screening discussed. Self breast awareness reviewed. Pap and HRV collected:  no.  Not indicated.  Guidelines for Calcium, Vitamin D , regular exercise program including cardiovascular and weight bearing exercise. Medication refills:  NA Labs with PCP.  [facility]  Fall risk reduction reviewed.  Follow up:  1 years for breast and pelvic exam and prn.    Additional counseling given.  yes. 25 min  total time was spent for this patient encounter, including preparation, face-to-face counseling with the patient, coordination of  care, and documentation of the encounter in addition to doing the breast and pelvic exam.

## 2023-08-13 ENCOUNTER — Ambulatory Visit: Admitting: Nurse Practitioner

## 2023-08-16 ENCOUNTER — Encounter: Payer: Self-pay | Admitting: Obstetrics and Gynecology

## 2023-08-16 ENCOUNTER — Ambulatory Visit (INDEPENDENT_AMBULATORY_CARE_PROVIDER_SITE_OTHER): Payer: Medicare HMO | Admitting: Obstetrics and Gynecology

## 2023-08-16 VITALS — BP 118/68 | HR 60 | Ht 65.0 in | Wt 130.0 lb

## 2023-08-16 DIAGNOSIS — M810 Age-related osteoporosis without current pathological fracture: Secondary | ICD-10-CM

## 2023-08-16 DIAGNOSIS — L723 Sebaceous cyst: Secondary | ICD-10-CM

## 2023-08-16 DIAGNOSIS — Z9289 Personal history of other medical treatment: Secondary | ICD-10-CM | POA: Diagnosis not present

## 2023-08-16 DIAGNOSIS — Z8742 Personal history of other diseases of the female genital tract: Secondary | ICD-10-CM | POA: Diagnosis not present

## 2023-08-16 DIAGNOSIS — Z01419 Encounter for gynecological examination (general) (routine) without abnormal findings: Secondary | ICD-10-CM

## 2023-08-16 NOTE — Patient Instructions (Addendum)
 Weak, Fragile Bones (Osteoporosis): What to Know  Osteoporosis is when the bones become thin and less dense than normal. Osteoporosis makes bones more fragile and more likely to break. Over time, the condition can cause your bones to become so weak that they break, or fracture, after a minor fall. Bones in the hip, wrist, and spine are most likely to break. What are the causes? The exact cause of this condition is not known. What increases the risk? Having family members with this condition. Taking: Steroid medicines. Anti-seizure medicines. Being female. Being 50 years old or older. Being of [place] decent. Smoking or using other products that contain nicotine or tobacco. Not exercising. What are the signs or symptoms? A broken bone might be the first sign, especially if the break results from a fall or injury that usually would not cause a bone to break. Other signs and symptoms include: Pain in the neck or low back. Being hunched over. Getting shorter. How is this diagnosed? This condition may be diagnosed based on: Your medical history. A physical exam. A bone mineral density test, also called a DXA or DEXA test. This test uses X-rays to measure how dense your bones are. How is this treated? This condition may be treated with medicines and supplements, including: Taking medicines to slow bone loss or help make the bones stronger. Taking calcium  and vitamin D  supplements every day. Taking hormone replacement medicines, such as estrogen for female and testosterone for males. It may also be treated by: Quitting smoking or using tobacco products. Doing exercises. Limiting how much alcohol you drink. Eating more foods with calcium  and vitamin D  in them. Monitoring your blood levels of calcium  and vitamin D . The goal of treatment is to strengthen your bones and lower your risk for a bone break. Follow these instructions at home: Eating and drinking Eat plenty of food with  calcium  and vitamin D  in them. This may include: Some fish, such as salmon and tuna. Foods that have calcium  and vitamin D  added to them, such as some cereals. Egg yolks. Cheese. Liver.  Activity Exercise as told. Ask what things are safe for you to do. You may be told to: Do exercises that make your muscles work to hold your body weight up, such as tai chi, yoga, or walking. These are called weight-bearing exercises. Do exercises to make your muscles stronger, such as lifting weights. These are called muscle-strengthening exercises. Lifestyle Do not drink alcohol if your health care provider tells you not to drink. Your health care provider tells you not to drink. You are pregnant, may be pregnant, or are planning to become pregnant. If you drink alcohol: Limit how much you have to: 0-1 drink a day if you're female. 0-2 drinks a day if you're female. Know how much alcohol is in your drink. In the [place] one drink is one 12 oz bottle of beer (355 mL), one 5 oz glass of wine (148 mL), or one 1 oz glass of hard liquor (44 mL). Do not smoke, vape, or use nicotine or tobacco. Preventing falls Use a cane, walker, scooter, or crutches to help you move around if needed. Keep rooms well-lit and get rid of clutter. Put away things on the floor that could make you trip, such as cords and rugs. Put grab bars in bathrooms and safety rails on stairs. Use rubber mats in slippery areas, like bathrooms. Wear shoes that: Fit you well. Support your feet. Have closed toes. Have rubber soles or low  heels. Talk to your provider about all of the medicines you take. Some medicines can make you more likely to fall because they can cause dizziness or changes in blood pressure. General instructions Take your medicines only as told. Keep all follow-up visits. Your provider may want to repeat tests. Where to find more information Bone Health and Osteoporosis Foundation: bonehealthandosteoporosis.org Contact  a health care provider if: You have never been screened for osteoporosis and you are: A female who is 49 years old or older. A female who is age 85 years old or older. Get help right away if: You fall. You get hurt. This information is not intended to replace advice given to you by your health care provider. Make sure you discuss any questions you have with your health care provider. Document Revised: 01/05/2023 Document Reviewed: 09/11/2022 Elsevier Patient Education  2024 Elsevier Inc.  EXERCISE AND DIET:  We recommended that you start or continue a regular exercise program for good health. Regular exercise means any activity that makes your heart beat faster and makes you sweat.  We recommend exercising at least 30 minutes per day at least 3 days a week, preferably 4 or 5.  We also recommend a diet low in fat and sugar.  Inactivity, poor dietary choices and obesity can cause diabetes, heart attack, stroke, and kidney damage, among others.    ALCOHOL AND SMOKING:  Women should limit their alcohol intake to no more than 7 drinks/beers/glasses of wine (combined, not each!) per week. Moderation of alcohol intake to this level decreases your risk of breast cancer and liver damage. And of course, no recreational drugs are part of a healthy lifestyle.  And absolutely no smoking or even second hand smoke. Most people know smoking can cause heart and lung diseases, but did you know it also contributes to weakening of your bones? Aging of your skin?  Yellowing of your teeth and nails?  CALCIUM  AND VITAMIN D :  Adequate intake of calcium  and Vitamin D  are recommended.  The recommendations for exact amounts of these supplements seem to change often, but generally speaking 600 mg of calcium  (either carbonate or citrate) and 800 units of Vitamin D  per day seems prudent. Certain women may benefit from higher intake of Vitamin D .  If you are among these women, your doctor will have told you during your visit.     PAP SMEARS:  Pap smears, to check for cervical cancer or precancers,  have traditionally been done yearly, although recent scientific advances have shown that most women can have pap smears less often.  However, every woman still should have a physical exam from her gynecologist every year. It will include a breast check, inspection of the vulva and vagina to check for abnormal growths or skin changes, a visual exam of the cervix, and then an exam to evaluate the size and shape of the uterus and ovaries.  And after 67 years of age, a rectal exam is indicated to check for rectal cancers. We will also provide age appropriate advice regarding health maintenance, like when you should have certain vaccines, screening for sexually transmitted diseases, bone density testing, colonoscopy, mammograms, etc.   MAMMOGRAMS:  All women over 47 years old should have a yearly mammogram. Many facilities now offer a "3D" mammogram, which may cost around $50 extra out of pocket. If possible,  we recommend you accept the option to have the 3D mammogram performed.  It both reduces the number of women who will be called  back for extra views which then turn out to be normal, and it is better than the routine mammogram at detecting truly abnormal areas.    COLONOSCOPY:  Colonoscopy to screen for colon cancer is recommended for all women at age 30.  We know, you hate the idea of the prep.  We agree, BUT, having colon cancer and not knowing it is worse!!  Colon cancer so often starts as a polyp that can be seen and removed at colonscopy, which can quite literally save your life!  And if your first colonoscopy is normal and you have no family history of colon cancer, most women don't have to have it again for 10 years.  Once every ten years, you can do something that may end up saving your life, right?  We will be happy to help you get it scheduled when you are ready.  Be sure to check your insurance coverage so you understand how  much it will cost.  It may be covered as a preventative service at no cost, but you should check your particular policy.

## 2023-08-17 ENCOUNTER — Ambulatory Visit
Admission: RE | Admit: 2023-08-17 | Discharge: 2023-08-17 | Disposition: A | Discharge status: Home / Self Care | Source: Ambulatory Visit | Attending: Nurse Practitioner | Admitting: Nurse Practitioner

## 2023-08-17 ENCOUNTER — Other Ambulatory Visit: Payer: Self-pay | Admitting: Nurse Practitioner

## 2023-08-17 ENCOUNTER — Other Ambulatory Visit

## 2023-08-17 ENCOUNTER — Ambulatory Visit
Admission: RE | Admit: 2023-08-17 | Discharge: 2023-08-17 | Discharge status: Home / Self Care | Source: Ambulatory Visit | Attending: Nurse Practitioner | Admitting: Nurse Practitioner

## 2023-08-17 ENCOUNTER — Encounter

## 2023-08-17 DIAGNOSIS — R928 Other abnormal and inconclusive findings on diagnostic imaging of breast: Secondary | ICD-10-CM

## 2023-08-17 DIAGNOSIS — H6123 Impacted cerumen, bilateral: Secondary | ICD-10-CM | POA: Diagnosis not present

## 2023-08-17 DIAGNOSIS — N6320 Unspecified lump in the left breast, unspecified quadrant: Secondary | ICD-10-CM

## 2023-08-17 DIAGNOSIS — N6321 Unspecified lump in the left breast, upper outer quadrant: Secondary | ICD-10-CM | POA: Diagnosis not present

## 2023-08-17 DIAGNOSIS — Q181 Preauricular sinus and cyst: Secondary | ICD-10-CM | POA: Diagnosis not present

## 2023-08-18 DIAGNOSIS — H5203 Hypermetropia, bilateral: Secondary | ICD-10-CM | POA: Diagnosis not present

## 2023-08-18 DIAGNOSIS — H52223 Regular astigmatism, bilateral: Secondary | ICD-10-CM | POA: Diagnosis not present

## 2023-08-18 DIAGNOSIS — H25813 Combined forms of age-related cataract, bilateral: Secondary | ICD-10-CM | POA: Diagnosis not present

## 2023-08-18 DIAGNOSIS — H524 Presbyopia: Secondary | ICD-10-CM | POA: Diagnosis not present

## 2023-08-18 DIAGNOSIS — E0500 Thyrotoxicosis with diffuse goiter without thyrotoxic crisis or storm: Secondary | ICD-10-CM | POA: Diagnosis not present

## 2023-08-20 NOTE — Telephone Encounter (Signed)
 Called pt and left detail message and phone number to give us  a call, and also details about her needing to come for a recheck of labs.

## 2023-08-24 ENCOUNTER — Other Ambulatory Visit

## 2023-08-24 ENCOUNTER — Encounter

## 2023-10-14 ENCOUNTER — Ambulatory Visit: Admitting: Nurse Practitioner

## 2023-11-04 ENCOUNTER — Other Ambulatory Visit

## 2023-11-04 DIAGNOSIS — R7989 Other specified abnormal findings of blood chemistry: Secondary | ICD-10-CM | POA: Diagnosis not present

## 2023-11-04 DIAGNOSIS — E785 Hyperlipidemia, unspecified: Secondary | ICD-10-CM

## 2023-11-04 LAB — COMPREHENSIVE METABOLIC PANEL WITH GFR
ALT: 8 U/L (ref 0–35)
AST: 13 U/L (ref 0–37)
Albumin: 4.2 g/dL (ref 3.5–5.2)
Alkaline Phosphatase: 77 U/L (ref 39–117)
BUN: 8 mg/dL (ref 6–23)
CO2: 28 meq/L (ref 19–32)
Calcium: 9.7 mg/dL (ref 8.4–10.5)
Chloride: 107 meq/L (ref 96–112)
Creatinine, Ser: 0.74 mg/dL (ref 0.40–1.20)
GFR: 83.87 mL/min (ref >60.00)
Glucose, Bld: 100 mg/dL — ABNORMAL HIGH (ref 70–99)
Potassium: 4.4 meq/L (ref 3.5–5.1)
Sodium: 145 meq/L (ref 135–145)
Total Bilirubin: 0.3 mg/dL (ref 0.2–1.2)
Total Protein: 6.8 g/dL (ref 6.0–8.3)

## 2023-11-04 LAB — LIPID PANEL
Cholesterol: 183 mg/dL (ref 0–200)
HDL: 60.8 mg/dL (ref >39.00)
LDL Cholesterol: 107 mg/dL — ABNORMAL HIGH (ref 0–99)
NonHDL: 121.83
Total CHOL/HDL Ratio: 3
Triglycerides: 72 mg/dL (ref 0.0–149.0)
VLDL: 14.4 mg/dL (ref 0.0–40.0)

## 2023-11-04 LAB — TSH: TSH: 4.3 u[IU]/mL (ref 0.35–5.50)

## 2023-11-04 LAB — T4, FREE: Free T4: 0.67 ng/dL (ref 0.60–1.60)

## 2023-11-04 LAB — T3, FREE: T3, Free: 3.5 pg/mL (ref 2.3–4.2)

## 2023-11-05 ENCOUNTER — Ambulatory Visit: Admitting: Nurse Practitioner

## 2023-11-05 ENCOUNTER — Ambulatory Visit: Payer: Self-pay | Admitting: Family

## 2023-11-11 ENCOUNTER — Ambulatory Visit: Admitting: Nurse Practitioner

## 2023-11-11 VITALS — BP 110/72 | HR 77 | Temp 97.8°F | Ht 65.0 in | Wt 129.5 lb

## 2023-11-11 DIAGNOSIS — R928 Other abnormal and inconclusive findings on diagnostic imaging of breast: Secondary | ICD-10-CM

## 2023-11-11 DIAGNOSIS — M810 Age-related osteoporosis without current pathological fracture: Secondary | ICD-10-CM | POA: Diagnosis not present

## 2023-11-11 DIAGNOSIS — R319 Hematuria, unspecified: Secondary | ICD-10-CM | POA: Diagnosis not present

## 2023-11-11 DIAGNOSIS — E0500 Thyrotoxicosis with diffuse goiter without thyrotoxic crisis or storm: Secondary | ICD-10-CM

## 2023-11-11 DIAGNOSIS — R591 Generalized enlarged lymph nodes: Secondary | ICD-10-CM | POA: Diagnosis not present

## 2023-11-11 DIAGNOSIS — Z23 Encounter for immunization: Secondary | ICD-10-CM

## 2023-11-11 NOTE — Progress Notes (Addendum)
 Established Patient Office Visit  Subjective   Patient ID: Brandi Stevens, female    DOB: May 17, 1955  Age: 68 y.o. MRN: [id number]  Chief Complaint  Patient presents with   Breast Mass   Patient was today for follow-up.  Hematuria: Reports episodic gross hematuria at home.  Has been ongoing for greater than 1 year.  Has been identified on labs in the office as well in the past.  She would like referral to specialist for further evaluation. Graves' disease: Not currently on thyroid  replacement.  Last thyroid  panel collected last month within normal limits. Osteoporosis: Is utilizing vitamin D  supplementation.  Not currently on any other medication at this time.  Last T-score from 2021 was -3.  Does not have a current fracture. Lymphadenopathy: History of submandibular lymphadenopathy.  Patient is very concerned regarding this.  Per last imaging in 2023 ultrasound identified lymphadenopathy stable since 2020.  Patient would like referral to surgeon to discuss removal. Left breast mass: Noted in left breast at 11:00 on last breast imaging in June 2025.  Recommendation was to repeat imaging in 6 months.  Patient would prefer to discuss removal, requesting referral to surgeon.    Review of Systems  Respiratory:  Negative for cough and shortness of breath.   Cardiovascular:  Negative for chest pain.  Genitourinary:  Positive for hematuria.      Objective:     BP 110/72   Pulse 77   Temp 97.8 F (36.6 C) (Temporal)   Ht 5' 5 (1.651 m)   Wt 129 lb 8 oz (58.7 kg)   SpO2 97%   BMI 21.55 kg/m    Physical Exam Vitals reviewed.  Constitutional:      General: She is not in acute distress.    Appearance: Normal appearance.  HENT:     Head: Normocephalic and atraumatic.  Neck:     Thyroid : No thyroid  mass or thyromegaly.  Cardiovascular:     Rate and Rhythm: Normal rate and regular rhythm.     Pulses: Normal pulses.     Heart sounds: Normal heart sounds.  Pulmonary:      Effort: Pulmonary effort is normal.     Breath sounds: Normal breath sounds.  Lymphadenopathy:     Cervical: No cervical adenopathy.  Skin:    General: Skin is warm and dry.  Neurological:     General: No focal deficit present.     Mental Status: She is alert and oriented to person, place, and time.  Psychiatric:        Mood and Affect: Mood normal.        Behavior: Behavior normal.        Judgment: Judgment normal.      No results found for any visits on 11/11/23.    The 10-year ASCVD risk score (Arnett DK, et al., 2019) is: 6.1%    Assessment & Plan:   Problem List Items Addressed This Visit       Endocrine   Graves disease   Chronic, stable Last thyroid  panel within normal limits, continue to monitor.  Consider checking thyroid  panel in 6 months at follow-up.        Musculoskeletal and Integument   Osteoporosis - Primary   Chronic Order DEXA scan Further conditions may have made based upon the results Patient to continue on vitamin D  supplementation      Relevant Orders   DG Bone Density     Immune and Lymphatic   Lymphadenopathy  Chronic, stable Patient is concerned about leaving and large lymph node in place would like to discuss possible removal.  Referral to ENT ordered today.      Relevant Orders   Ambulatory referral to ENT     Other   Abnormal mammogram   Mass noted on screening mammogram and ultrasound in June 2025. Patient would like referral to surgeon to discuss removal. Mass located to left breast at 11:00. Referral to surgery ordered today.      Relevant Orders   Ambulatory referral to General Surgery   Hematuria   Chronic, intermittent Patient would like referral to urology to discuss possible cystoscopy.  Referral ordered today.      Relevant Orders   Ambulatory referral to Urology  Assessment and Plan  Return in about 6 months (around 05/10/2024) for F/U with Messina Kosinski.   I personally spent a total of 40 minutes in the care of  the patient today including preparing to see the patient, getting/reviewing separately obtained history, performing a medically appropriate exam/evaluation, counseling and educating, placing orders, documenting clinical information in the EHR, and communicating results.    Lauraine FORBES Pereyra, NP

## 2023-11-11 NOTE — Assessment & Plan Note (Signed)
 Mass noted on screening mammogram and ultrasound in June 2025. Patient would like referral to surgeon to discuss removal. Mass located to left breast at 11:00. Referral to surgery ordered today.

## 2023-11-11 NOTE — Addendum Note (Signed)
 Addended by: LEAR, Nupur Hohman P on: 11/11/2023 09:02 AM   Modules accepted: Orders

## 2023-11-11 NOTE — Assessment & Plan Note (Signed)
 Chronic Order DEXA scan Further conditions may have made based upon the results Patient to continue on vitamin D  supplementation

## 2023-11-11 NOTE — Assessment & Plan Note (Signed)
 Chronic, stable Patient is concerned about leaving and large lymph node in place would like to discuss possible removal.  Referral to ENT ordered today.

## 2023-11-11 NOTE — Assessment & Plan Note (Signed)
 Chronic, intermittent Patient would like referral to urology to discuss possible cystoscopy.  Referral ordered today.

## 2023-11-11 NOTE — Assessment & Plan Note (Signed)
 Chronic, stable Last thyroid  panel within normal limits, continue to monitor.  Consider checking thyroid  panel in 6 months at follow-up.

## 2023-11-23 ENCOUNTER — Other Ambulatory Visit

## 2023-12-02 ENCOUNTER — Encounter (INDEPENDENT_AMBULATORY_CARE_PROVIDER_SITE_OTHER): Payer: Self-pay | Admitting: Otolaryngology

## 2023-12-02 ENCOUNTER — Ambulatory Visit (INDEPENDENT_AMBULATORY_CARE_PROVIDER_SITE_OTHER): Admitting: Otolaryngology

## 2023-12-02 VITALS — BP 114/70 | HR 71

## 2023-12-02 DIAGNOSIS — R590 Localized enlarged lymph nodes: Secondary | ICD-10-CM | POA: Diagnosis not present

## 2023-12-02 DIAGNOSIS — F17210 Nicotine dependence, cigarettes, uncomplicated: Secondary | ICD-10-CM

## 2023-12-02 DIAGNOSIS — K1120 Sialoadenitis, unspecified: Secondary | ICD-10-CM | POA: Diagnosis not present

## 2023-12-02 DIAGNOSIS — Z720 Tobacco use: Secondary | ICD-10-CM

## 2023-12-02 NOTE — Patient Instructions (Signed)
 Dear Brandi Stevens,   Congratulations for your interest in quitting smoking!  Find a program that suits you best: when you want to quit, how you need support, where you live, and how you like to learn.    If you're ready to get started TODAY, consider scheduling a visit through [email or web address].  Appointments are available from 8am to 8pm, Monday to Friday.   Most health insurance plans will cover some level of tobacco cessation visits and medications.    Additional Resources: [employer] are also available to help you quit & provide the support you'll need. Many programs are available in both [place] and Spanish and have a long history of successfully helping people get off and stay off tobacco.    Quit Smoking Apps:  quitSTART at [employer].de QuitGuide?at [employer].dk Online education and resources: Smokefree  at [employer].gov Free Telephone Coaching: QuitNow,  Call 1-800-QUIT-NOW (432-531-8788) or Text- Ready to 806-412-0249 *Quitline [place] has teamed up with Medicaid to offer a free 14 week program    Vaping- Want to Quit? Free 24/7 support. Call [facility]  [place], [place], [place], [place], [place]  [facility]

## 2023-12-02 NOTE — Progress Notes (Signed)
 ENT CONSULT:  Reason for Consult: right neck mass   HPI: Discussed the use of AI scribe software for clinical note transcription with the patient, who gave verbal consent to proceed.  History of Present Illness Brandi Stevens is a 68 year old female who presents with a stable neck lump since 2008 in the area of the right submandibular gland.  She has had a neck lump since 2008, which has remained stable in size over the years. Multiple imaging studies, including a CT scan in 2020 and an ultrasound in 2023, confirmed the stability of the lump. The patient recalls being told that the lump was called lymph nodes after an ultrasound in 2009. No associated symptoms such as fevers, chills, or weight loss.  She has a history of minor swelling of the salivary glands, noted on a CT scan in 2020. She describes experiencing 'little pockets' in her mouth previously, suggesting intermittent enlargement of the salivary glands.  She smokes two cigarettes a day and is actively working on quitting. She is increasing her water intake, aiming for at least 48 ounces daily.  Records Reviewed:  PCP office visit 11/11/23 Lauraine Pereyra NP Hematuria: Reports episodic gross hematuria at home.  Has been ongoing for greater than 1 year.  Has been identified on labs in the office as well in the past.  She would like referral to specialist for further evaluation. Graves' disease: Not currently on thyroid  replacement.  Last thyroid  panel collected last month within normal limits. Osteoporosis: Is utilizing vitamin D  supplementation.  Not currently on any other medication at this time.  Last T-score from 2021 was -3.  Does not have a current fracture. Lymphadenopathy: History of submandibular lymphadenopathy.  Patient is very concerned regarding this.  Per last imaging in 2023 ultrasound identified lymphadenopathy stable since 2020.  Patient would like referral to surgeon to discuss removal. Left breast mass: Noted in left breast  at 11:00 on last breast imaging in June 2025.  Recommendation was to repeat imaging in 6 months.  Patient would prefer to discuss removal, requesting referral to surgeon.    Past Medical History:  Diagnosis Date   Arthritis    Bilateral shoulders.   Bradycardia    Exercise stress test normal 2008   Cyst of ear canal 10/08/2022   Diverticulitis 2012   Emphysema    Last PFTs 1980s, not on current inhaler thearpy   Fibroadenoma of breast 01/2012   R breast   Graves disease 1980s   RAI in 2008, not on any thyroid  replacement hormone   Neutropenia 05/16/2021   Osteoarthritis of shoulder    Bilateral arthritis/bursitis   Rotator cuff tear arthropathy    R cuff   Seasonal allergies    SVD (spontaneous vaginal delivery)    x 3   Tubular adenoma of colon 01/2015    Past Surgical History:  Procedure Laterality Date   ABDOMINAL HYSTERECTOMY  1989   BREAST BIOPSY  12/22/2011   Procedure: BREAST BIOPSY;  Surgeon: Lynda Leos, MD;  Location: [facility] OR;  Service: General;  Laterality: Right;  Excision right breast mass   BREAST EXCISIONAL BIOPSY     CHOLECYSTECTOMY  1989   COLONOSCOPY  01/2015   hx polyps-[facility]   COLONOSCOPY  2019   MS-MAC-prep good (goly)-TA- repeat 3 yrs   EYE SURGERY Left    MULTIPLE TOOTH EXTRACTIONS     POLYPECTOMY  2019   TA   ROTATOR CUFF REPAIR     R shoulder x2 Dr.  Dow at [employer] CUFF REPAIR Right    SHOULDER ARTHROSCOPY W/ SUPERIOR LABRAL ANTERIOR POSTERIOR LESION REPAIR Right    bursitis   TUBAL LIGATION      Family History  Problem Relation Age of Onset   Colon polyps Mother 64   Pancreatic cancer Mother 15       pancreatic   Hearing loss Father    Stroke Father 2       ischemic stroke   Prostate cancer Brother 41       with mets   Gastric cancer Maternal Aunt        gastric   Colon polyps Maternal Grandfather 57   Colon cancer Maternal Grandfather 64   Rectal cancer Neg Hx    Stomach cancer Neg Hx    Esophageal  cancer Neg Hx     Social History:  reports that she has been smoking cigarettes. She started smoking about 47 years ago. She has a 47.7 pack-year smoking history. She has been exposed to tobacco smoke. She has never used smokeless tobacco. She reports that she does not drink alcohol and does not use drugs.  Allergies:  Allergies  Allergen Reactions   Boniva  [Ibandronate ] Diarrhea and Nausea And Vomiting   Latex Anaphylaxis and Rash    Latex gloves/ skin itch    Sulfa Antibiotics Shortness Of Breath, Itching and Anaphylaxis    Also, wheezing    Medications: I have reviewed the patient's current medications.  The PMH, PSH, Medications, Allergies, and SH were reviewed and updated.  ROS: Constitutional: Negative for fever, weight loss and weight gain. Cardiovascular: Negative for chest pain and dyspnea on exertion. Respiratory: Is not experiencing shortness of breath at rest. Gastrointestinal: Negative for nausea and vomiting. Neurological: Negative for headaches. Psychiatric: The patient is not nervous/anxious  Blood pressure 114/70, pulse 71, SpO2 98%. There is no height or weight on file to calculate BMI.  PHYSICAL EXAM:  Exam: General: Well-developed, well-nourished Respiratory Respiratory effort: Equal inspiration and expiration without stridor Cardiovascular Peripheral Vascular: Warm extremities with equal color/perfusion Eyes: No nystagmus with equal extraocular motion bilaterally Neuro/Psych/Balance: Patient oriented to person, place, and time; Appropriate mood and affect; Gait is intact with no imbalance; Cranial nerves I-XII are intact Head and Face Inspection: Normocephalic and atraumatic without mass or lesion Palpation: Facial skeleton intact without bony stepoffs Salivary Glands: No mass or tenderness Facial Strength: Facial motility symmetric and full bilaterally ENT Pinna: External ear intact and fully developed External canal: Canal is patent with intact  skin Tympanic Membrane: Clear and mobile External Nose: No scar or anatomic deformity Internal Nose: Septum is S-shaped. No polyp, or purulence. Mucosal edema and erythema present.  Bilateral inferior turbinate hypertrophy.  Lips, Teeth, and gums: Mucosa and teeth intact and viable TMJ: No pain to palpation with full mobility Oral cavity/oropharynx: No erythema or exudate, no lesions present Nasopharynx: No mass or lesion with intact mucosa Hypopharynx: Intact mucosa without pooling of secretions Larynx Glottic: Full true vocal cord mobility without lesion or mass Supraglottic: Normal appearing epiglottis and AE folds Interarytenoid Space: Moderate pachydermia&edema Subglottic Space: Patent without lesion or edema Neck Neck and Trachea: Midline trachea without mass or lesion Thyroid : No mass or nodularity Lymphatics: No lymphadenopathy  Procedure: Preoperative diagnosis: right neck mass hx of smoking  Postoperative diagnosis:   Same   Procedure: Flexible fiberoptic laryngoscopy  Surgeon: Elena Larry, MD  Anesthesia: Topical lidocaine  and Afrin Complications: None Condition is stable throughout exam  Indications and  consent:  The patient presents to the clinic with above symptoms. Indirect laryngoscopy view was incomplete. Thus it was recommended that they undergo a flexible fiberoptic laryngoscopy. All of the risks, benefits, and potential complications were reviewed with the patient preoperatively and verbal informed consent was obtained.  Procedure: The patient was seated upright in the clinic. Topical lidocaine  and Afrin were applied to the nasal cavity. After adequate anesthesia had occurred, I then proceeded to pass the flexible telescope into the nasal cavity. The nasal cavity was patent without rhinorrhea or polyp. The nasopharynx was also patent without mass or lesion. The base of tongue was visualized and was normal. There were no signs of pooling of secretions in the  piriform sinuses. The true vocal folds were mobile bilaterally. There were no signs of glottic or supraglottic mucosal lesion or mass. There was moderate interarytenoid pachydermia and post cricoid edema. The telescope was then slowly withdrawn and the patient tolerated the procedure throughout.      Studies Reviewed: CT chest 01/27/23 Narrative & Impression  CLINICAL DATA:  43 pack-year smoking history/quit 4 years ago   EXAM: CT CHEST WITHOUT CONTRAST LOW-DOSE FOR LUNG CANCER SCREENING   TECHNIQUE: Multidetector CT imaging of the chest was performed following the standard protocol without IV contrast.   RADIATION DOSE REDUCTION: This exam was performed according to the departmental dose-optimization program which includes automated exposure control, adjustment of the mA and/or kV according to patient size and/or use of iterative reconstruction technique.   COMPARISON:  12/15/2021   FINDINGS: Cardiovascular: Aortic atherosclerosis. Normal heart size, without pericardial effusion.   Mediastinum/Nodes: No mediastinal or hilar adenopathy, given limitations of unenhanced CT.   Lungs/Pleura: Moderate centrilobular and paraseptal emphysema. Again identified is relatively diffuse mild cylindrical bronchiectasis which may be postinfectious/inflammatory.   No suspicious pulmonary nodule or mass.   Upper Abdomen: Cholecystectomy. Normal imaged portions of the liver, spleen, stomach, pancreas, adrenal glands, kidneys.   Musculoskeletal: Mild osteopenia.   IMPRESSION: 1. Lung-RADS 1, negative. Continue annual screening with low-dose chest CT without contrast in 12 months. 2. Aortic atherosclerosis (ICD10-I70.0) and emphysema   Neck U/S 04/24/21 IMPRESSION: Mildly enlarged bilateral submandibular lymph nodes which are stable from a 2020 neck CT.  11/17/2018 CT  IMPRESSION: 1. Generous sized submandibular lymph nodes with reactive appearance that is similar to 2015. No  underlying mass or inflammation is seen. 2. Prominent bilateral parotid and submandibular ducts without stone or other obstructive process.  CBC w/diff 6 hrs ago normal CBC normal 4 mo  Assessment/Plan: Encounter Diagnoses  Name Primary?   Cervical lymphadenopathy Yes   Sialoadenitis     Assessment and Plan Assessment & Plan Right neck lymphadenopathy Chronic stable lymphadenopathy since 2008 with no systemic symptoms or malignancy signs, and negative exam today, it is not palpable and flexible scope exam performed due to hx of smoking was unremarkable. Differential includes reactive lymphadenopathy. - Order neck ultrasound to re-evaluate and if normal will repeat imaging only if it grows in size (already had CT neck 2020 and neck U/S 2023) - Provided phone number for ultrasound scheduling. - Call her with ultrasound results.  Recurrent salivary gland swelling Minor swelling of salivary glands noted on CT in 2020, currently no significant swelling. Smoking may contribute. - Encourage water intake of at least 48 ounces daily. - sour candy and gland massage when it recurs  Tobacco use disorder.  We had an extensive discussion about detrimental effects of smoking on overall health. I provided resources available at [facility] to assist with smoking cessation. I spent 4 min on counseling  - Advised smoking cessation      Thank you for allowing me to participate in the care of this patient. Please do not hesitate to contact me with any questions or concerns.   Elena Larry, MD Otolaryngology [facility] Phone: [phone] Fax: [phone]    12/02/2023, 4:01 PM

## 2023-12-12 ENCOUNTER — Encounter (HOSPITAL_BASED_OUTPATIENT_CLINIC_OR_DEPARTMENT_OTHER): Payer: Self-pay | Admitting: Obstetrics & Gynecology

## 2023-12-12 MED ORDER — DOXYCYCLINE HYCLATE 100 MG PO CAPS
100.0000 mg | ORAL_CAPSULE | Freq: Two times a day (BID) | ORAL | 0 refills | Status: DC
Start: 2023-12-12 — End: 2024-01-24

## 2023-12-14 ENCOUNTER — Ambulatory Visit (INDEPENDENT_AMBULATORY_CARE_PROVIDER_SITE_OTHER)
Admission: RE | Admit: 2023-12-14 | Discharge: 2023-12-14 | Disposition: A | Discharge status: Home / Self Care | Source: Ambulatory Visit | Attending: Nurse Practitioner | Admitting: Nurse Practitioner

## 2023-12-14 DIAGNOSIS — M810 Age-related osteoporosis without current pathological fracture: Secondary | ICD-10-CM

## 2023-12-16 ENCOUNTER — Ambulatory Visit: Payer: Self-pay | Admitting: Nurse Practitioner

## 2023-12-16 ENCOUNTER — Telehealth: Payer: Self-pay | Admitting: *Deleted

## 2023-12-16 NOTE — Telephone Encounter (Signed)
 Spoke with patient. Patient reports hx of recurrent boils on labia/vulva. Same area has a boil that came up on 12/09/23. Called on call provider 10/5, was started on doxycycline  BID x7 days. Patient reports area is improving and draining. Requesting visit with Dr. Nikki for f/u. Currently cleaning with antibacterial soap, applying thin layer of antibiotic ointment. Denies redness, fever/chills. Some pain is present at site, this has improved.   OV scheduled for work-in appt on 12/20/23 at 0915 with Dr. Nikki. Patient aware if new symptoms develop or symptoms get worse, call office for earlier visit with covering provider. Complete abx, warm soaks and ibuprofen OTC for pain PRN. Advised I will forward to Dr. Nikki, our office will f/u if any additional recommendations. Patient agreeable.   Routing to Dr. Nikki for review.

## 2023-12-17 ENCOUNTER — Ambulatory Visit (HOSPITAL_COMMUNITY)

## 2023-12-17 NOTE — Telephone Encounter (Signed)
 I agree with your recommendations.  I will see Brandi Stevens on 12/20/23.  You may close this encounter.

## 2023-12-17 NOTE — Progress Notes (Signed)
 GYNECOLOGY  VISIT   HPI: 68 y.o.   Divorced  [place] female   [id number] with No LMP recorded. Patient is postmenopausal.   here for: Boil on R side of vulva. Had gone down but has now come back. Was taking doxycycline  for 7 days has finished.  No fevers.    States that area is draining.  She wants this to be gone.     Has also been using Mupirocin  in a thin layer to the area.    States it is always itching.    She has known sebaceous cysts of the vulva.  Not sexually active.   No hx HSV I or II.   GYNECOLOGIC HISTORY: No LMP recorded. Patient is postmenopausal. Contraception:  PMP Menopausal hormone therapy:  n/a Last 2 paps:  years ago History of abnormal Pap or positive HPV:  no Mammogram:  08/17/23 L Breast - Breast Density Cat C, BIRADS Cat 3 prob benign         OB History     Gravida  3   Para  3   Term  3   Preterm      AB      Living  3      SAB      IAB      Ectopic      Multiple      Live Births  3              Patient Active Problem List   Diagnosis Date Noted   Hematuria 11/11/2023   Lymphadenopathy 11/11/2023   Former smoker 01/15/2023   Abnormal mammogram 07/03/2022   Allergic conjunctivitis 07/03/2022   Allergic rhinitis 07/03/2022   Acute cough 01/16/2022   Encounter for screening for lung cancer 11/20/2021   Neuropathy 11/20/2021   Need for prophylactic vaccination with Streptococcus pneumoniae (Pneumococcus) and Influenza vaccines 11/20/2021   Need for vaccination 11/20/2021   Genetic testing 06/06/2021   Family history of prostate cancer 05/21/2021   Family history of colon cancer 05/21/2021   Allergic conjunctivitis of both eyes 05/16/2021   Encounter for general adult medical examination with abnormal findings 05/16/2021   Prediabetes 05/16/2021   Abdominal pain, LLQ 04/18/2021   FHx: pancreatic cancer 09/05/2019   COVID-19 virus detected 08/25/2018   Osteoporosis 05/27/2017   Vitamin D  deficiency  05/09/2017   Submandibular lymphadenopathy 10/12/2013   Routine health maintenance 04/12/2013   Sinus bradycardia 05/27/2012   Graves disease 05/27/2012   Allergic conjunctivitis and rhinitis 05/27/2012   Rotator cuff arthropathy 05/27/2012   Fibroadenoma of right breast 05/27/2012   Other emphysema (HCC) 05/27/2012   Special screening for malignant neoplasms, colon 05/27/2012    Past Medical History:  Diagnosis Date   Arthritis    Bilateral shoulders.   Bradycardia    Exercise stress test normal 2008   Cyst of ear canal 10/08/2022   Diverticulitis 2012   Emphysema    Last PFTs 1980s, not on current inhaler thearpy   Fibroadenoma of breast 01/2012   R breast   Graves disease 1980s   RAI in 2008, not on any thyroid  replacement hormone   Neutropenia 05/16/2021   Osteoarthritis of shoulder    Bilateral arthritis/bursitis   Rotator cuff tear arthropathy    R cuff   Seasonal allergies    SVD (spontaneous vaginal delivery)    x 3   Tubular adenoma of colon 01/2015    Past Surgical History:  Procedure Laterality Date   ABDOMINAL  HYSTERECTOMY  1989   BREAST BIOPSY  12/22/2011   Procedure: BREAST BIOPSY;  Surgeon: Lynda Leos, MD;  Location: [facility] OR;  Service: General;  Laterality: Right;  Excision right breast mass   BREAST EXCISIONAL BIOPSY     CHOLECYSTECTOMY  1989   COLONOSCOPY  01/2015   hx polyps-[facility]ark   COLONOSCOPY  2019   MS-MAC-prep good (goly)-TA- repeat 3 yrs   EYE SURGERY Left    MULTIPLE TOOTH EXTRACTIONS     POLYPECTOMY  2019   TA   ROTATOR CUFF REPAIR     R shoulder [facility] Dr. [facility] at [facility]   ROTATOR CUFF REPAIR Right    SHOULDER ARTHROSCOPY W/ SUPERIOR LABRAL ANTERIOR POSTERIOR LESION REPAIR Right    bursitis   TUBAL LIGATION      Current Outpatient Medications  Medication Sig Dispense Refill   acetaminophen  (TYLENOL ) 650 MG CR tablet Take 650 mg by mouth every 8 (eight) hours as needed for pain.     cetirizine (ZYRTEC) 10 MG chewable  tablet Chew 10 mg by mouth daily as needed.     cholecalciferol (VITAMIN D ) 1000 UNITS tablet Take 5,000 Units by mouth 3 (three) times a week.     diclofenac  sodium (VOLTAREN ) 1 % GEL Apply 2 g topically daily as needed. For shoulder pain per patient 6 Tube 1   doxycycline  (VIBRAMYCIN ) 100 MG capsule Take 1 capsule (100 mg total) by mouth 2 (two) times daily. Take with food as can cause GI distress. 14 capsule 0   fluticasone  (FLONASE ) 50 MCG/ACT nasal spray Place 1 spray into both nostrils daily. 16 g 5   gabapentin  (NEURONTIN ) 100 MG capsule Take 1 capsule (100 mg total) by mouth at bedtime. 30 capsule 2   levocetirizine (XYZAL ) 5 MG tablet Take 0.5 tablets (2.5 mg total) by mouth every evening. 90 tablet 1   linaclotide  (LINZESS ) 145 MCG CAPS capsule Take 1 capsule (145 mcg total) by mouth daily as needed. 30 capsule 2   Multiple Vitamins-Minerals (CENTRUM SILVER PO) Take 1 tablet by mouth daily.     Olopatadine  HCl 0.2 % SOLN Apply 1 drop to eye in the morning. 2.5 mL 2   polyethylene glycol powder (GLYCOLAX /MIRALAX ) 17 GM/SCOOP powder Take 17 g by mouth 2 (two) times daily as needed. 225 g 5   No current facility-administered medications for this visit.     ALLERGIES: Boniva  [ibandronate ], Latex, and Sulfa antibiotics  Family History  Problem Relation Age of Onset   Colon polyps Mother 68   Pancreatic cancer Mother 79       pancreatic   Hearing loss Father    Stroke Father 24       ischemic stroke   Prostate cancer Brother 1       with mets   Gastric cancer Maternal Aunt        gastric   Colon polyps Maternal Grandfather 13   Colon cancer Maternal Grandfather 74   Rectal cancer Neg Hx    Stomach cancer Neg Hx    Esophageal cancer Neg Hx     Social History   Socioeconomic History   Marital status: Divorced    Spouse name: Not on file   Number of children: 3   Years of education: Not on file   Highest education level: Associate degree: occupational, Scientist, product/process development, or  vocational program  Occupational History   Occupation: CNA    Comment: [employer]  Tobacco Use   Smoking status: Every Day  Current packs/day: 1.00    Average packs/day: 1 pack/day for 47.8 years (47.8 ttl pk-yrs)    Types: Cigarettes    Start date: 03/09/1976    Passive exposure: Current   Smokeless tobacco: Never   Tobacco comments:    Pt has not quit smoking - still smoking as of 05/17/23.    Vaping Use   Vaping status: Never Used  Substance and Sexual Activity   Alcohol use: No    Alcohol/week: 0.0 standard drinks of alcohol   Drug use: No   Sexual activity: Not Currently    Birth control/protection: Post-menopausal    Comment: Less than 5, intercourse before 17, history of STD, no DES  Other Topics Concern   Not on file  Social History Narrative   Live in [place], lives alone. Sons nearby.    Social Drivers of Corporate investment banker Strain: Low Risk  (11/10/2023)   Overall Financial Resource Strain (CARDIA)    Difficulty of Paying Living Expenses: Not hard at all  Food Insecurity: No Food Insecurity (11/10/2023)   Hunger Vital Sign    Worried About Running Out of Food in the Last Year: Never true    Ran Out of Food in the Last Year: Never true  Transportation Needs: No Transportation Needs (11/10/2023)   PRAPARE - Administrator, Civil Service (Medical): No    Lack of Transportation (Non-Medical): No  Physical Activity: Sufficiently Active (11/10/2023)   Exercise Vital Sign    Days of Exercise per Week: 5 days    Minutes of Exercise per Session: 30 min  Stress: No Stress Concern Present (11/10/2023)   [employer] of Occupational Health - Occupational Stress Questionnaire    Feeling of Stress: Not at all  Social Connections: Moderately Integrated (11/10/2023)   Social Connection and Isolation Panel    Frequency of Communication with Friends and Family: More than three times a week    Frequency of Social Gatherings with Friends and Family:  Once a week    Attends Religious Services: More than 4 times per year    Active Member of [employer] or Organizations: Yes    Attends Banker Meetings: More than 4 times per year    Marital Status: Divorced  Intimate Partner Violence: Not At Risk (05/17/2023)   Humiliation, Afraid, Rape, and Kick questionnaire    Fear of Current or Ex-Partner: No    Emotionally Abused: No    Physically Abused: No    Sexually Abused: No    Review of Systems  All other systems reviewed and are negative.   PHYSICAL EXAMINATION:   BP 110/64 (BP Location: Left Arm, Patient Position: Sitting)   Pulse (!) 53   SpO2 98%     General appearance: alert, cooperative and appears stated age   Pelvic: External genitalia:  sebaceous cysts noted.  Right labia majora also with raised lesion of skin, ulcerated, and with pus drainage.               Urethra:  normal appearing urethra with no masses, tenderness or lesions              Bartholins and Skenes: normal                 Vagina: normal appearing vagina with normal color and discharge, no lesions              Cervix: no lesions  Bimanual Exam:  Uterus:  normal size, contour, position, consistency, mobility, non-tender              Adnexa: no mass, fullness, tenderness   Chaperone was present for exam:  Kari HERO, CMA  ASSESSMENT:  Right labial abscess/ulcer.  Sebaceous cysts.   PLAN:  Wound cx.  HSV swab.  Keflex 500 mg po bid x 7 days.  FU prn.  26 min  total time was spent for this patient encounter, including preparation, face-to-face counseling with the patient, coordination of care, and documentation of the encounter.

## 2023-12-20 ENCOUNTER — Ambulatory Visit: Admitting: Obstetrics and Gynecology

## 2023-12-20 ENCOUNTER — Encounter: Payer: Self-pay | Admitting: Obstetrics and Gynecology

## 2023-12-20 VITALS — BP 110/64 | HR 53

## 2023-12-20 DIAGNOSIS — N766 Ulceration of vulva: Secondary | ICD-10-CM | POA: Diagnosis not present

## 2023-12-20 DIAGNOSIS — N764 Abscess of vulva: Secondary | ICD-10-CM | POA: Diagnosis not present

## 2023-12-20 MED ORDER — CEPHALEXIN 500 MG PO CAPS: 500.0000 mg | ORAL_CAPSULE | Freq: Two times a day (BID) | ORAL | 0 refills | Status: DC

## 2023-12-22 ENCOUNTER — Ambulatory Visit: Payer: Self-pay | Admitting: Obstetrics and Gynecology

## 2023-12-22 ENCOUNTER — Encounter: Payer: Self-pay | Admitting: Obstetrics and Gynecology

## 2023-12-22 LAB — SURESWAB HSV, TYPE 1/2 DNA, PCR
HSV 1 DNA: NOT DETECTED
HSV 2 DNA: DETECTED — AB

## 2023-12-23 ENCOUNTER — Other Ambulatory Visit: Payer: Self-pay | Admitting: Obstetrics and Gynecology

## 2023-12-23 LAB — WOUND CULTURE
MICRO NUMBER:: 17090776
SPECIMEN QUALITY:: ADEQUATE

## 2023-12-23 MED ORDER — TRIAMCINOLONE ACETONIDE 0.025 % EX OINT: 1.0000 | TOPICAL_OINTMENT | Freq: Two times a day (BID) | CUTANEOUS | 1 refills | Status: AC

## 2023-12-27 ENCOUNTER — Ambulatory Visit (HOSPITAL_COMMUNITY)
Admission: RE | Admit: 2023-12-27 | Discharge: 2023-12-27 | Disposition: A | Discharge status: Home / Self Care | Source: Ambulatory Visit | Attending: Otolaryngology | Admitting: Otolaryngology

## 2023-12-27 DIAGNOSIS — R590 Localized enlarged lymph nodes: Secondary | ICD-10-CM | POA: Insufficient documentation

## 2023-12-28 ENCOUNTER — Ambulatory Visit: Payer: Self-pay | Admitting: General Surgery

## 2023-12-28 DIAGNOSIS — D4862 Neoplasm of uncertain behavior of left breast: Secondary | ICD-10-CM | POA: Diagnosis not present

## 2023-12-28 DIAGNOSIS — N6322 Unspecified lump in the left breast, upper inner quadrant: Secondary | ICD-10-CM

## 2023-12-29 ENCOUNTER — Telehealth (INDEPENDENT_AMBULATORY_CARE_PROVIDER_SITE_OTHER): Payer: Self-pay

## 2023-12-29 NOTE — Telephone Encounter (Signed)
 Had a soft tissue done in September with [facility] would like to have a follow up appointment for next steps of the findings.

## 2023-12-29 NOTE — Telephone Encounter (Signed)
 Tomorrow at 930 Am is fine

## 2023-12-30 ENCOUNTER — Encounter (INDEPENDENT_AMBULATORY_CARE_PROVIDER_SITE_OTHER): Payer: Self-pay | Admitting: Otolaryngology

## 2023-12-30 ENCOUNTER — Encounter (HOSPITAL_COMMUNITY): Payer: Self-pay

## 2023-12-30 ENCOUNTER — Ambulatory Visit (INDEPENDENT_AMBULATORY_CARE_PROVIDER_SITE_OTHER): Admitting: Otolaryngology

## 2023-12-30 ENCOUNTER — Other Ambulatory Visit: Payer: Self-pay | Admitting: General Surgery

## 2023-12-30 VITALS — BP 155/81 | HR 63 | Ht 65.0 in | Wt 131.0 lb

## 2023-12-30 DIAGNOSIS — F17200 Nicotine dependence, unspecified, uncomplicated: Secondary | ICD-10-CM

## 2023-12-30 DIAGNOSIS — R590 Localized enlarged lymph nodes: Secondary | ICD-10-CM | POA: Diagnosis not present

## 2023-12-30 DIAGNOSIS — F17210 Nicotine dependence, cigarettes, uncomplicated: Secondary | ICD-10-CM

## 2023-12-30 DIAGNOSIS — N6322 Unspecified lump in the left breast, upper inner quadrant: Secondary | ICD-10-CM

## 2023-12-30 NOTE — Progress Notes (Signed)
 Jenna Cordella LABOR, MD  Michaelene Setter PROCEDURE / BIOPSY REVIEW Date: 12/30/23  Requested Biopsy site: RIGHT submandibular lymph nodes measuring 1.5 x 0.8 x 1.2 cm and 0.8 x 0.6 x 0.8 cm  Reason for request: abnormal lymph node Imaging review: Best seen on US   Decision: Approved Imaging modality to perform: Ultrasound Schedule with: No sedation / Local anesthetic Schedule for: Any VIR  Additional comments: @Schedulers .   Please contact me with questions, concerns, or if issue pertaining to this request arise.  Cordella LABOR Jenna, MD [facility]       Previous Messages    ----- Message ----- From: Makhayla Mcmurry Sent: 12/30/2023   9:59 AM EDT To: Seraj Dunnam; Ir Procedure Requests Subject: US  CORE BIOPSY (LYMPH NODES)                  Procedure : US  CORE BIOPSY (LYMPH NODES)   Reason: right submandibular gland lymphadenopathy Dx: Cervical lymphadenopathy [R59.0 (ICD-10-CM)]    History : US  soft tissue head and neck  Provider : Tobie Eldora NOVAK, MD  Contact : [phone]

## 2023-12-30 NOTE — Patient Instructions (Signed)
 I have ordered an imaging study for you to complete prior to your next visit. Please call [facility] Scheduling at [phone] to schedule your imaging if you have not received a call within 24 hours. If you are unable to complete your imaging study prior to your next scheduled visit please call our office to let us  know.

## 2023-12-30 NOTE — Progress Notes (Signed)
 Dear Dr. Elnor, Here is my assessment for our mutual patient, Brandi Stevens. Thank you for allowing me the opportunity to care for your patient. Please do not hesitate to contact me should you have any other questions. Sincerely, Dr. Eldora Blanch  Otolaryngology Clinic Note Referring provider: Dr. Elnor HPI:  Initial visit (12/2023): Initially saw Dr. Soldatova with her notes summarized below. She reports she has noticed right submandibular gland swelling/lymph nodes at least since 2008, and has been checked periodically. Sometimes has some discomfort, but this is rare. She has gotten CT and US  for it, including multiple. She saw my partner Dr. Soldatova who had an US . She has had antibiotics recently but did not change in size. No B symptoms.  Patient otherwise denies: - dysphagia, otalgia, odynophagia, unintentional weight loss - changes in voice, shortness of breath, hemoptysis  She sees Dr. Luciano for her ears.   She is interested in removal of her lymph nodes because of significant concerns from personal standpoint regarding cancer  Denies prior biopsy of these LN Of note, she denies any frequent episodes of Submandibular or parotid sialadenitits  ENT Surgery: no Personal or FHx of bleeding dz or anesthesia difficulty: no  GLP-1: no AP/AC: no  Tobacco: current  PMHx: Graves disease, AR  Independent Review of Additional Tests or Records:  Dr. Soldatova (12/02/2023): She has had a neck lump since 2008, which has remained stable in size over the years. Multiple imaging studies, including a CT scan in 2020 and an ultrasound in 2023, confirmed the stability of the lump. The patient recalls being told that the lump was called lymph nodes after an ultrasound in 2009. No associated symptoms such as fevers, chills, or weight loss.  TFL without masses; Dx: R neck LAD - rec US  CBC 07/12/2023: WBC 4.1 US  12/27/2023 independently interpreted and agree with read - noted mlutiple LN  submandibular area -- with two other small LN without normal hilum and thickening -- increased in size compared to prior US  04/24/2021: noted b/l submandibular LN, with effacement of hilar fat 12x8 mm, size similar to CT CT Neck 11/17/2018 interpreted: noted fairly prominent SMG glands with bilateral submandibular LN (more rounded) right > left. Bilateral parotid and SMG ducts seem enlarged PMH/Meds/All/SocHx/FamHx/ROS:   Past Medical History:  Diagnosis Date   Arthritis    Bilateral shoulders.   Bradycardia    Exercise stress test normal 2008   Cyst of ear canal 10/08/2022   Diverticulitis 2012   Emphysema    Last PFTs 1980s, not on current inhaler thearpy   Fibroadenoma of breast 01/2012   R breast   Graves disease 1980s   RAI in 2008, not on any thyroid  replacement hormone   HSV-2 infection 2025   Neutropenia 05/16/2021   Osteoarthritis of shoulder    Bilateral arthritis/bursitis   Rotator cuff tear arthropathy    R cuff   Seasonal allergies    SVD (spontaneous vaginal delivery)    x 3   Tubular adenoma of colon 01/2015     Past Surgical History:  Procedure Laterality Date   ABDOMINAL HYSTERECTOMY  1989   BREAST BIOPSY  12/22/2011   Procedure: BREAST BIOPSY;  Surgeon: Lynda Leos, MD;  Location: [facility] OR;  Service: General;  Laterality: Right;  Excision right breast mass   BREAST EXCISIONAL BIOPSY     CHOLECYSTECTOMY  1989   COLONOSCOPY  01/2015   hx polyps-[facility]   COLONOSCOPY  2019   MS-MAC-prep good (goly)-TA- repeat 3 yrs  EYE SURGERY Left    MULTIPLE TOOTH EXTRACTIONS     POLYPECTOMY  2019   TA   ROTATOR CUFF REPAIR     R shoulder x2 Dr. Dow at [facility]   ROTATOR CUFF REPAIR Right    SHOULDER ARTHROSCOPY W/ SUPERIOR LABRAL ANTERIOR POSTERIOR LESION REPAIR Right    bursitis   TUBAL LIGATION      Family History  Problem Relation Age of Onset   Colon polyps Mother 70   Pancreatic cancer Mother 69       pancreatic   Hearing loss Father     Stroke Father 50       ischemic stroke   Prostate cancer Brother 36       with mets   Gastric cancer Maternal Aunt        gastric   Colon polyps Maternal Grandfather 44   Colon cancer Maternal Grandfather 80   Rectal cancer Neg Hx    Stomach cancer Neg Hx    Esophageal cancer Neg Hx      Social Connections: Moderately Integrated (11/10/2023)   Social Connection and Isolation Panel    Frequency of Communication with Friends and Family: More than three times a week    Frequency of Social Gatherings with Friends and Family: Once a week    Attends Religious Services: More than 4 times per year    Active Member of [employer] or Organizations: Yes    Attends Engineer, structural: More than 4 times per year    Marital Status: Divorced      Current Outpatient Medications:    acetaminophen  (TYLENOL ) 650 MG CR tablet, Take 650 mg by mouth every 8 (eight) hours as needed for pain., Disp: , Rfl:    cephALEXin (KEFLEX) 500 MG capsule, Take 1 capsule (500 mg total) by mouth 2 (two) times daily. Take twice daily for 1 week., Disp: 14 capsule, Rfl: 0   cetirizine (ZYRTEC) 10 MG chewable tablet, Chew 10 mg by mouth daily as needed., Disp: , Rfl:    cholecalciferol (VITAMIN D ) 1000 UNITS tablet, Take 5,000 Units by mouth 3 (three) times a week., Disp: , Rfl:    diclofenac  sodium (VOLTAREN ) 1 % GEL, Apply 2 g topically daily as needed. For shoulder pain per patient, Disp: 6 Tube, Rfl: 1   doxycycline  (VIBRAMYCIN ) 100 MG capsule, Take 1 capsule (100 mg total) by mouth 2 (two) times daily. Take with food as can cause GI distress., Disp: 14 capsule, Rfl: 0   fluticasone  (FLONASE ) 50 MCG/ACT nasal spray, Place 1 spray into both nostrils daily., Disp: 16 g, Rfl: 5   gabapentin  (NEURONTIN ) 100 MG capsule, Take 1 capsule (100 mg total) by mouth at bedtime., Disp: 30 capsule, Rfl: 2   levocetirizine (XYZAL ) 5 MG tablet, Take 0.5 tablets (2.5 mg total) by mouth every evening., Disp: 90 tablet, Rfl: 1    linaclotide  (LINZESS ) 145 MCG CAPS capsule, Take 1 capsule (145 mcg total) by mouth daily as needed., Disp: 30 capsule, Rfl: 2   Multiple Vitamins-Minerals (CENTRUM SILVER PO), Take 1 tablet by mouth daily., Disp: , Rfl:    Olopatadine  HCl 0.2 % SOLN, Apply 1 drop to eye in the morning., Disp: 2.5 mL, Rfl: 2   polyethylene glycol powder (GLYCOLAX /MIRALAX ) 17 GM/SCOOP powder, Take 17 g by mouth 2 (two) times daily as needed., Disp: 225 g, Rfl: 5   triamcinolone  (KENALOG ) 0.025 % ointment, Apply 1 Application topically 2 (two) times daily. Use twice daily for 1 -  2 weeks as needed for skin irritation and itching., Disp: 30 g, Rfl: 1   Physical Exam:   BP (!) 155/81 (BP Location: Left Arm, Patient Position: Sitting, Cuff Size: Normal)   Pulse 63   Ht 5' 5 (1.651 m)   Wt 131 lb (59.4 kg)   BMI 21.80 kg/m   Salient findings:  CN II-XII intact Bilateral EAC clear and TM intact with well pneumatized middle ear spaces Anterior rhinoscopy: Septum intact; bilateral inferior turbinates without significant hypertrophy No lesions of oral cavity/oropharynx; easily expressible saliva both submandibular ducts No obviously palpable neck masses/lymphadenopathy/thyromegaly EXCEPT - mass she points to appears to be b/l ptotic submandibular gland, small lymph node (<~1 cm) adjacent, mobile No respiratory distress or stridor  Seprately Identifiable Procedures:  Prior to initiating any procedures, risks/benefits/alternatives were explained to the patient and verbal consent obtained. None today - offered TFL ,deferred Impression & Plans:  Alazay Leicht is a 68 y.o. female with:  1. Cervical lymphadenopathy   2. Tobacco use disorder    Noted b/l SMG LAD at least since 2015, most recent US  showing some size change in patient with h/o smoking. Offered but deferred TFL. Given this and her concerns re: carcinoma, I think reasonable to get bx. Depending on that, can consider removal but she does wish for it  strongly. Perhaps related to chronic sialadenitits? But she denies sx  Ordered core Bx submandibular LN -- f/u by phone 6 weeks after for possible excisional LN biopsy for her peace of mind  Given the patient's tobacco use, I also discussed cessation and options for cessation, including counseling. Counseled patient on the dangers of tobacco use, advised patient to stop smoking, and reviewed strategies to maximize success. Total time spent with this was 4 minutes.     See below regarding exact medications prescribed this encounter including dosages and route: No orders of the defined types were placed in this encounter.     Thank you for allowing me the opportunity to care for your patient. Please do not hesitate to contact me should you have any other questions.  Sincerely, Eldora Blanch, MD Otolaryngologist (ENT), [facility] Phone: [phone] Fax: [phone]  12/30/2023, 12:15 PM   I have personally spent 40 minutes involved in face-to-face and non-face-to-face activities for this patient on the day of the visit.  Professional time spent excludes any procedures performed but includes the following activities, in addition to those noted in the documentation: preparing to see the patient (review of outside documentation and results), performing a medically appropriate examination, counseling, documenting in the electronic health record, independently interpreting results (Multiple ultrasound, CT).

## 2024-01-14 DIAGNOSIS — R319 Hematuria, unspecified: Secondary | ICD-10-CM | POA: Diagnosis not present

## 2024-01-14 DIAGNOSIS — R3121 Asymptomatic microscopic hematuria: Secondary | ICD-10-CM | POA: Diagnosis not present

## 2024-01-17 ENCOUNTER — Encounter (HOSPITAL_BASED_OUTPATIENT_CLINIC_OR_DEPARTMENT_OTHER): Payer: Self-pay | Admitting: General Surgery

## 2024-01-17 ENCOUNTER — Other Ambulatory Visit: Payer: Self-pay

## 2024-01-18 NOTE — Progress Notes (Unsigned)
 GYNECOLOGY  VISIT   HPI: 68 y.o.   Divorced  African American female   [phone] with No LMP recorded. Patient is postmenopausal.   here for: Discuss results   HSV 2 positive vulvar DNA swab on 12/20/23.     She would like a confirmatory test.   This is an unexpected diagnosis for patient.  She has not been sexually active for many years.   11/06/14:  HIV nonreactive, Hep C Aby neg  Hx chlamydia in 2008, testing done in [place].     GYNECOLOGIC HISTORY: No LMP recorded. Patient is postmenopausal. Contraception:  PMP Menopausal hormone therapy:  n/a Last 2 paps:  years ago, status post hysterectomy History of abnormal Pap or positive HPV:  no Mammogram:  08/17/23 L Breast - Breast Density Cat C, BIRADS Cat 3 prob benign          OB History     Gravida  3   Para  3   Term  3   Preterm      AB      Living  3      SAB      IAB      Ectopic      Multiple      Live Births  3              Patient Active Problem List   Diagnosis Date Noted   Hematuria 11/11/2023   Lymphadenopathy 11/11/2023   Former smoker 01/15/2023   Abnormal mammogram 07/03/2022   Allergic conjunctivitis 07/03/2022   Allergic rhinitis 07/03/2022   Acute cough 01/16/2022   Encounter for screening for lung cancer 11/20/2021   Neuropathy 11/20/2021   Need for prophylactic vaccination with Streptococcus pneumoniae (Pneumococcus) and Influenza vaccines 11/20/2021   Need for vaccination 11/20/2021   Genetic testing 06/06/2021   Family history of prostate cancer 05/21/2021   Family history of colon cancer 05/21/2021   Allergic conjunctivitis of both eyes 05/16/2021   Encounter for general adult medical examination with abnormal findings 05/16/2021   Prediabetes 05/16/2021   Abdominal pain, LLQ 04/18/2021   FHx: pancreatic cancer 09/05/2019   COVID-19 virus detected 08/25/2018   Osteoporosis 05/27/2017   Vitamin D  deficiency 05/09/2017   Submandibular lymphadenopathy 10/12/2013    Routine health maintenance 04/12/2013   Sinus bradycardia 05/27/2012   Graves disease 05/27/2012   Allergic conjunctivitis and rhinitis 05/27/2012   Rotator cuff arthropathy 05/27/2012   Fibroadenoma of right breast 05/27/2012   Other emphysema (HCC) 05/27/2012   Special screening for malignant neoplasms, colon 05/27/2012    Past Medical History:  Diagnosis Date   Arthritis    Bilateral shoulders.   Bradycardia    Exercise stress test normal 2008   Cyst of ear canal 10/08/2022   Diverticulitis 2012   Emphysema    Last PFTs 1980s, not on current inhaler thearpy   Fibroadenoma of breast 01/2012   R breast   Graves disease 1980s   RAI in 2008, not on any thyroid  replacement hormone   HSV-2 infection 2025   Neutropenia 05/16/2021   Osteoarthritis of shoulder    Bilateral arthritis/bursitis   Rotator cuff tear arthropathy    R cuff   Seasonal allergies    SVD (spontaneous vaginal delivery)    x 3   Tubular adenoma of colon 01/2015    Past Surgical History:  Procedure Laterality Date   ABDOMINAL HYSTERECTOMY  1989   BREAST BIOPSY  12/22/2011   Procedure: BREAST BIOPSY;  Surgeon:  Lynda Leos, MD;  Location: [facility] OR;  Service: General;  Laterality: Right;  Excision right breast mass   BREAST EXCISIONAL BIOPSY     CHOLECYSTECTOMY  1989   COLONOSCOPY  01/2015   hx polyps-[facility]   COLONOSCOPY  2019   MS-MAC-prep good (goly)-TA- repeat 3 yrs   EYE SURGERY Left    MULTIPLE TOOTH EXTRACTIONS     POLYPECTOMY  2019   TA   ROTATOR CUFF REPAIR     R shoulder x2 Dr. Dow at [facility]   ROTATOR CUFF REPAIR Right    SHOULDER ARTHROSCOPY W/ SUPERIOR LABRAL ANTERIOR POSTERIOR LESION REPAIR Right    bursitis   TUBAL LIGATION      Current Outpatient Medications  Medication Sig Dispense Refill   acetaminophen  (TYLENOL ) 650 MG CR tablet Take 650 mg by mouth every 8 (eight) hours as needed for pain.     cephALEXin (KEFLEX) 500 MG capsule Take 1 capsule (500 mg total) by  mouth 2 (two) times daily. Take twice daily for 1 week. 14 capsule 0   cetirizine (ZYRTEC) 10 MG chewable tablet Chew 10 mg by mouth daily as needed.     cholecalciferol (VITAMIN D ) 1000 UNITS tablet Take 5,000 Units by mouth 3 (three) times a week.     diclofenac  sodium (VOLTAREN ) 1 % GEL Apply 2 g topically daily as needed. For shoulder pain per patient 6 Tube 1   doxycycline  (VIBRAMYCIN ) 100 MG capsule Take 1 capsule (100 mg total) by mouth 2 (two) times daily. Take with food as can cause GI distress. 14 capsule 0   fluticasone  (FLONASE ) 50 MCG/ACT nasal spray Place 1 spray into both nostrils daily. 16 g 5   gabapentin  (NEURONTIN ) 100 MG capsule Take 1 capsule (100 mg total) by mouth at bedtime. 30 capsule 2   levocetirizine (XYZAL ) 5 MG tablet Take 0.5 tablets (2.5 mg total) by mouth every evening. 90 tablet 1   linaclotide  (LINZESS ) 145 MCG CAPS capsule Take 1 capsule (145 mcg total) by mouth daily as needed. 30 capsule 2   Multiple Vitamins-Minerals (CENTRUM SILVER PO) Take 1 tablet by mouth daily.     Olopatadine  HCl 0.2 % SOLN Apply 1 drop to eye in the morning. 2.5 mL 2   polyethylene glycol powder (GLYCOLAX /MIRALAX ) 17 GM/SCOOP powder Take 17 g by mouth 2 (two) times daily as needed. 225 g 5   triamcinolone  (KENALOG ) 0.025 % ointment Apply 1 Application topically 2 (two) times daily. Use twice daily for 1 - 2 weeks as needed for skin irritation and itching. 30 g 1   No current facility-administered medications for this visit.     ALLERGIES: Boniva  [ibandronate ], Latex, and Sulfa antibiotics  Family History  Problem Relation Age of Onset   Colon polyps Mother 49   Pancreatic cancer Mother 7       pancreatic   Hearing loss Father    Stroke Father 77       ischemic stroke   Prostate cancer Brother 57       with mets   Gastric cancer Maternal Aunt        gastric   Colon polyps Maternal Grandfather 39   Colon cancer Maternal Grandfather 52   Rectal cancer Neg Hx    Stomach  cancer Neg Hx    Esophageal cancer Neg Hx     Social History   Socioeconomic History   Marital status: Divorced    Spouse name: Not on file   Number of children:  3   Years of education: Not on file   Highest education level: Associate degree: occupational, scientist, product/process development, or vocational program  Occupational History   Occupation: CNA    Comment: [employer] in [employer]  Tobacco Use   Smoking status: Every Day    Current packs/day: 1.00    Average packs/day: 1 pack/day for 47.9 years (47.9 ttl pk-yrs)    Types: Cigarettes    Start date: 03/09/1976    Passive exposure: Current   Smokeless tobacco: Never   Tobacco comments:    Pt has not quit smoking - still smoking as of 05/17/23.    Vaping Use   Vaping status: Never Used  Substance and Sexual Activity   Alcohol use: No    Alcohol/week: 0.0 standard drinks of alcohol   Drug use: No   Sexual activity: Not Currently    Birth control/protection: Post-menopausal    Comment: Less than 5, intercourse before 17, history of STD, no DES  Other Topics Concern   Not on file  Social History Narrative   Live in [place], lives alone. Sons nearby.    Social Drivers of Corporate Investment Banker Strain: Low Risk  (11/10/2023)   Overall Financial Resource Strain (CARDIA)    Difficulty of Paying Living Expenses: Not hard at all  Food Insecurity: No Food Insecurity (11/10/2023)   Hunger Vital Sign    Worried About Running Out of Food in the Last Year: Never true    Ran Out of Food in the Last Year: Never true  Transportation Needs: No Transportation Needs (11/10/2023)   PRAPARE - Administrator, Civil Service (Medical): No    Lack of Transportation (Non-Medical): No  Physical Activity: Sufficiently Active (11/10/2023)   Exercise Vital Sign    Days of Exercise per Week: 5 days    Minutes of Exercise per Session: 30 min  Stress: No Stress Concern Present (11/10/2023)   [employer] of Occupational Health - Occupational Stress  Questionnaire    Feeling of Stress: Not at all  Social Connections: Moderately Integrated (11/10/2023)   Social Connection and Isolation Panel    Frequency of Communication with Friends and Family: More than three times a week    Frequency of Social Gatherings with Friends and Family: Once a week    Attends Religious Services: More than 4 times per year    Active Member of [employer] or Organizations: Yes    Attends Banker Meetings: More than 4 times per year    Marital Status: Divorced  Intimate Partner Violence: Not At Risk (05/17/2023)   Humiliation, Afraid, Rape, and Kick questionnaire    Fear of Current or Ex-Partner: No    Emotionally Abused: No    Physically Abused: No    Sexually Abused: No    Review of Systems  All other systems reviewed and are negative.   PHYSICAL EXAMINATION:   BP 118/66 (BP Location: Left Arm, Patient Position: Sitting)   Pulse 74   SpO2 97%     General appearance: alert, cooperative and appears stated age  ASSESSMENT:  HSV 2.   PLAN:  We discussed HSV, route of transmission, asymptomatic shedding possible if not having an outbreak, recurrence of outbreaks, condoms for infection prevention, treatment with antiviral medication to reduce time of outbreak/decrease frequency of infections/reduce viral shedding.  We discussed Valtrex, risks and benefits.  She declines a prescription.  She will complete STD screening today, including HSV I and II serum IgG testing.  She  may use Triamcinolone  ointment or lidocaine  gel or ointment for local relief of symptoms.  Good handwashing technique discussed.  FU prn.   35 min  total time was spent for this patient encounter, including preparation, face-to-face counseling with the patient, coordination of care, and documentation of the encounter.

## 2024-01-19 ENCOUNTER — Ambulatory Visit: Admitting: Obstetrics and Gynecology

## 2024-01-19 ENCOUNTER — Other Ambulatory Visit (HOSPITAL_COMMUNITY)
Admission: RE | Admit: 2024-01-19 | Discharge: 2024-01-19 | Disposition: A | Discharge status: Home / Self Care | Source: Ambulatory Visit | Attending: Obstetrics and Gynecology | Admitting: Obstetrics and Gynecology

## 2024-01-19 ENCOUNTER — Encounter: Payer: Self-pay | Admitting: Obstetrics and Gynecology

## 2024-01-19 VITALS — BP 118/66 | HR 74

## 2024-01-19 DIAGNOSIS — B009 Herpesviral infection, unspecified: Secondary | ICD-10-CM | POA: Diagnosis not present

## 2024-01-19 DIAGNOSIS — Z113 Encounter for screening for infections with a predominantly sexual mode of transmission: Secondary | ICD-10-CM

## 2024-01-19 DIAGNOSIS — Z114 Encounter for screening for human immunodeficiency virus [HIV]: Secondary | ICD-10-CM | POA: Diagnosis not present

## 2024-01-19 DIAGNOSIS — Z1159 Encounter for screening for other viral diseases: Secondary | ICD-10-CM

## 2024-01-19 NOTE — Addendum Note (Signed)
 Addended by: CATHLYN JAYSON CARY, Kostantinos Tallman E on: 01/19/2024 01:20 PM   Modules accepted: Orders

## 2024-01-20 ENCOUNTER — Ambulatory Visit (HOSPITAL_COMMUNITY)
Admission: RE | Admit: 2024-01-20 | Discharge: 2024-01-20 | Disposition: A | Discharge status: Home / Self Care | Source: Ambulatory Visit | Attending: Otolaryngology | Admitting: Otolaryngology

## 2024-01-20 ENCOUNTER — Other Ambulatory Visit: Payer: Self-pay | Admitting: General Surgery

## 2024-01-20 DIAGNOSIS — N6322 Unspecified lump in the left breast, upper inner quadrant: Secondary | ICD-10-CM

## 2024-01-20 DIAGNOSIS — R590 Localized enlarged lymph nodes: Secondary | ICD-10-CM | POA: Insufficient documentation

## 2024-01-20 DIAGNOSIS — R591 Generalized enlarged lymph nodes: Secondary | ICD-10-CM | POA: Diagnosis not present

## 2024-01-20 LAB — CERVICOVAGINAL ANCILLARY ONLY
Chlamydia: NEGATIVE
Comment: NEGATIVE
Comment: NEGATIVE
Comment: NORMAL
Neisseria Gonorrhea: NEGATIVE
Trichomonas: NEGATIVE

## 2024-01-20 MED ORDER — CHLORHEXIDINE GLUCONATE CLOTH 2 % EX PADS: 6.0000 | MEDICATED_PAD | Freq: Once | CUTANEOUS | Status: DC

## 2024-01-20 MED ORDER — LIDOCAINE HCL (PF) 1 % IJ SOLN
10.0000 mL | Freq: Once | INTRAMUSCULAR | Status: AC
  Administered 2024-01-20: 10 mL via INTRADERMAL

## 2024-01-20 NOTE — Progress Notes (Signed)

## 2024-01-20 NOTE — Procedures (Signed)
 Pre procedural Dx: lymphadenopathy  Post procedural Dx: Same  Technically successful US  guided lymph node core needle biopsy   EBL: None.   Complications: None immediate.   KANDICE Banner, MD Pager #: [phone]

## 2024-01-21 ENCOUNTER — Ambulatory Visit
Admission: RE | Admit: 2024-01-21 | Discharge: 2024-01-21 | Disposition: A | Discharge status: Home / Self Care | Source: Ambulatory Visit | Attending: General Surgery | Admitting: General Surgery

## 2024-01-21 ENCOUNTER — Encounter

## 2024-01-21 ENCOUNTER — Ambulatory Visit: Payer: Self-pay | Admitting: Obstetrics and Gynecology

## 2024-01-21 DIAGNOSIS — N6322 Unspecified lump in the left breast, upper inner quadrant: Secondary | ICD-10-CM

## 2024-01-24 ENCOUNTER — Ambulatory Visit (HOSPITAL_BASED_OUTPATIENT_CLINIC_OR_DEPARTMENT_OTHER): Admitting: Anesthesiology

## 2024-01-24 ENCOUNTER — Encounter (HOSPITAL_BASED_OUTPATIENT_CLINIC_OR_DEPARTMENT_OTHER): Payer: Self-pay | Admitting: General Surgery

## 2024-01-24 ENCOUNTER — Encounter (HOSPITAL_BASED_OUTPATIENT_CLINIC_OR_DEPARTMENT_OTHER)
Admission: RE | Disposition: A | Discharge status: Home / Self Care | Payer: Self-pay | Source: Home / Self Care | Attending: General Surgery

## 2024-01-24 ENCOUNTER — Ambulatory Visit
Admission: RE | Admit: 2024-01-24 | Discharge: 2024-01-24 | Disposition: A | Source: Ambulatory Visit | Attending: General Surgery | Admitting: General Surgery

## 2024-01-24 ENCOUNTER — Ambulatory Visit (HOSPITAL_BASED_OUTPATIENT_CLINIC_OR_DEPARTMENT_OTHER)
Admission: RE | Admit: 2024-01-24 | Discharge: 2024-01-24 | Disposition: A | Discharge status: Home / Self Care | Attending: General Surgery | Admitting: General Surgery

## 2024-01-24 DIAGNOSIS — E559 Vitamin D deficiency, unspecified: Secondary | ICD-10-CM | POA: Diagnosis not present

## 2024-01-24 DIAGNOSIS — Z87891 Personal history of nicotine dependence: Secondary | ICD-10-CM | POA: Diagnosis not present

## 2024-01-24 DIAGNOSIS — D242 Benign neoplasm of left breast: Secondary | ICD-10-CM | POA: Diagnosis not present

## 2024-01-24 DIAGNOSIS — R7303 Prediabetes: Secondary | ICD-10-CM | POA: Insufficient documentation

## 2024-01-24 DIAGNOSIS — F17210 Nicotine dependence, cigarettes, uncomplicated: Secondary | ICD-10-CM | POA: Diagnosis not present

## 2024-01-24 DIAGNOSIS — N6322 Unspecified lump in the left breast, upper inner quadrant: Secondary | ICD-10-CM

## 2024-01-24 DIAGNOSIS — Z79899 Other long term (current) drug therapy: Secondary | ICD-10-CM | POA: Diagnosis not present

## 2024-01-24 DIAGNOSIS — J449 Chronic obstructive pulmonary disease, unspecified: Secondary | ICD-10-CM | POA: Diagnosis not present

## 2024-01-24 DIAGNOSIS — J438 Other emphysema: Secondary | ICD-10-CM | POA: Insufficient documentation

## 2024-01-24 DIAGNOSIS — N6320 Unspecified lump in the left breast, unspecified quadrant: Secondary | ICD-10-CM | POA: Diagnosis not present

## 2024-01-24 DIAGNOSIS — M1990 Unspecified osteoarthritis, unspecified site: Secondary | ICD-10-CM | POA: Insufficient documentation

## 2024-01-24 LAB — SURGICAL PATHOLOGY

## 2024-01-24 SURGERY — RADIOACTIVE SEED GUIDED BREAST BIOPSY
Anesthesia: General | Site: Breast | Laterality: Left

## 2024-01-24 MED ORDER — ACETAMINOPHEN 500 MG PO TABS
1000.0000 mg | ORAL_TABLET | Freq: Once | ORAL | Status: AC
  Administered 2024-01-24: 1000 mg via ORAL

## 2024-01-24 MED ORDER — MIDAZOLAM HCL 5 MG/5ML IJ SOLN
INTRAMUSCULAR | Status: DC | PRN
  Administered 2024-01-24: 2 mg via INTRAVENOUS

## 2024-01-24 MED ORDER — FENTANYL CITRATE (PF) 100 MCG/2ML IJ SOLN
INTRAMUSCULAR | Status: DC | PRN
  Administered 2024-01-24 (×2): 50 ug via INTRAVENOUS

## 2024-01-24 MED ORDER — OXYCODONE HCL 5 MG/5ML PO SOLN: 5.0000 mg | Freq: Once | ORAL | Status: DC | PRN

## 2024-01-24 MED ORDER — LACTATED RINGERS IV SOLN: INTRAVENOUS | Status: DC

## 2024-01-24 MED ORDER — DEXAMETHASONE SODIUM PHOSPHATE 4 MG/ML IJ SOLN
INTRAMUSCULAR | Status: DC | PRN
  Administered 2024-01-24: 10 mg via INTRAVENOUS

## 2024-01-24 MED ORDER — GABAPENTIN 100 MG PO CAPS
100.0000 mg | ORAL_CAPSULE | ORAL | Status: AC
  Administered 2024-01-24: 100 mg via ORAL

## 2024-01-24 MED ORDER — BUPIVACAINE-EPINEPHRINE (PF) 0.25% -1:200000 IJ SOLN
INTRAMUSCULAR | Status: AC
  Filled 2024-01-24: qty 30

## 2024-01-24 MED ORDER — PROPOFOL 10 MG/ML IV BOLUS
INTRAVENOUS | Status: DC | PRN
Start: 2024-01-24 — End: 2024-01-24
  Administered 2024-01-24: 120 mg via INTRAVENOUS

## 2024-01-24 MED ORDER — ACETAMINOPHEN 500 MG PO TABS: 1000.0000 mg | ORAL_TABLET | ORAL | Status: AC

## 2024-01-24 MED ORDER — CEFAZOLIN SODIUM-DEXTROSE 2-4 GM/100ML-% IV SOLN
INTRAVENOUS | Status: AC
  Filled 2024-01-24: qty 100

## 2024-01-24 MED ORDER — OXYCODONE HCL 5 MG PO TABS: 5.0000 mg | ORAL_TABLET | Freq: Four times a day (QID) | ORAL | 0 refills | Status: AC | PRN

## 2024-01-24 MED ORDER — ONDANSETRON HCL 4 MG/2ML IJ SOLN
INTRAMUSCULAR | Status: AC
  Filled 2024-01-24: qty 2

## 2024-01-24 MED ORDER — FENTANYL CITRATE (PF) 100 MCG/2ML IJ SOLN
INTRAMUSCULAR | Status: AC
  Filled 2024-01-24: qty 2

## 2024-01-24 MED ORDER — 0.9 % SODIUM CHLORIDE (POUR BTL) OPTIME
TOPICAL | Status: DC | PRN
  Administered 2024-01-24: 1000 mL

## 2024-01-24 MED ORDER — BUPIVACAINE-EPINEPHRINE (PF) 0.25% -1:200000 IJ SOLN
INTRAMUSCULAR | Status: DC | PRN
  Administered 2024-01-24: 20 mL

## 2024-01-24 MED ORDER — MIDAZOLAM HCL 2 MG/2ML IJ SOLN
INTRAMUSCULAR | Status: AC
  Filled 2024-01-24: qty 2

## 2024-01-24 MED ORDER — GABAPENTIN 100 MG PO CAPS
ORAL_CAPSULE | ORAL | Status: AC
  Filled 2024-01-24: qty 1

## 2024-01-24 MED ORDER — ONDANSETRON HCL 4 MG/2ML IJ SOLN
INTRAMUSCULAR | Status: DC | PRN
  Administered 2024-01-24: 4 mg via INTRAVENOUS

## 2024-01-24 MED ORDER — LIDOCAINE HCL (CARDIAC) PF 100 MG/5ML IV SOSY
PREFILLED_SYRINGE | INTRAVENOUS | Status: DC | PRN
  Administered 2024-01-24: 50 mg via INTRAVENOUS

## 2024-01-24 MED ORDER — HYDROMORPHONE HCL 1 MG/ML IJ SOLN: 0.2500 mg | INTRAMUSCULAR | Status: DC | PRN

## 2024-01-24 MED ORDER — ACETAMINOPHEN 500 MG PO TABS
ORAL_TABLET | ORAL | Status: AC
Start: 2024-01-24 — End: 2024-01-24
  Filled 2024-01-24: qty 2

## 2024-01-24 MED ORDER — EPHEDRINE SULFATE (PRESSORS) 25 MG/5ML IV SOSY
PREFILLED_SYRINGE | INTRAVENOUS | Status: DC | PRN
Start: 2024-01-24 — End: 2024-01-24
  Administered 2024-01-24: 10 mg via INTRAVENOUS

## 2024-01-24 MED ORDER — OXYCODONE HCL 5 MG PO TABS: 5.0000 mg | ORAL_TABLET | Freq: Once | ORAL | Status: DC | PRN

## 2024-01-24 MED ORDER — AMISULPRIDE (ANTIEMETIC) 5 MG/2ML IV SOLN: 10.0000 mg | Freq: Once | INTRAVENOUS | Status: DC | PRN

## 2024-01-24 MED ORDER — CEFAZOLIN SODIUM-DEXTROSE 2-4 GM/100ML-% IV SOLN
2.0000 g | INTRAVENOUS | Status: AC
  Administered 2024-01-24: 2 g via INTRAVENOUS

## 2024-01-24 MED ORDER — ONDANSETRON HCL 4 MG/2ML IJ SOLN: 4.0000 mg | Freq: Once | INTRAMUSCULAR | Status: DC | PRN

## 2024-01-24 SURGICAL SUPPLY — 32 items
BLADE SURG 15 STRL LF DISP TIS (BLADE) ×1 IMPLANT
CANISTER SUC SOCK COL 7IN (MISCELLANEOUS) ×1 IMPLANT
CANISTER SUCT 1200ML W/VALVE (MISCELLANEOUS) ×1 IMPLANT
CHLORAPREP W/TINT 26 (MISCELLANEOUS) ×1 IMPLANT
CLIP APPLIE 9.375 MED OPEN (MISCELLANEOUS) IMPLANT
COVER BACK TABLE 60X90IN (DRAPES) ×1 IMPLANT
COVER MAYO STAND STRL (DRAPES) ×1 IMPLANT
COVER PROBE CYLINDRICAL 5X96 (MISCELLANEOUS) ×1 IMPLANT
DERMABOND ADVANCED .7 DNX12 (GAUZE/BANDAGES/DRESSINGS) ×1 IMPLANT
DRAPE LAPAROSCOPIC ABDOMINAL (DRAPES) ×1 IMPLANT
DRAPE UTILITY XL STRL (DRAPES) ×1 IMPLANT
ELECT COATED BLADE 2.86 ST (ELECTRODE) ×1 IMPLANT
ELECTRODE REM PT RTRN 9FT ADLT (ELECTROSURGICAL) ×1 IMPLANT
GLOVE BIO SURGEON STRL SZ7.5 (GLOVE) ×2 IMPLANT
GOWN STRL REUS W/ TWL LRG LVL3 (GOWN DISPOSABLE) ×2 IMPLANT
KIT MARKER MARGIN INK (KITS) ×1 IMPLANT
NDL HYPO 25X1 1.5 SAFETY (NEEDLE) IMPLANT
NEEDLE HYPO 25X1 1.5 SAFETY (NEEDLE) IMPLANT
PACK BASIN DAY SURGERY FS (CUSTOM PROCEDURE TRAY) ×1 IMPLANT
PENCIL SMOKE EVACUATOR (MISCELLANEOUS) ×1 IMPLANT
SLEEVE SCD COMPRESS KNEE MED (STOCKING) ×1 IMPLANT
SOLN 0.9% NACL POUR BTL 1000ML (IV SOLUTION) IMPLANT
SPIKE FLUID TRANSFER (MISCELLANEOUS) IMPLANT
SPONGE T-LAP 18X18 ~~LOC~~+RFID (SPONGE) ×1 IMPLANT
SUT MON AB 4-0 PC3 18 (SUTURE) ×1 IMPLANT
SUT SILK 2 0 SH (SUTURE) IMPLANT
SUT VICRYL 3-0 CR8 SH (SUTURE) ×1 IMPLANT
SYR CONTROL 10ML LL (SYRINGE) IMPLANT
TOWEL GREEN STERILE FF (TOWEL DISPOSABLE) ×1 IMPLANT
TRAY FAXITRON CT DISP (TRAY / TRAY PROCEDURE) ×1 IMPLANT
TUBE CONNECTING 20X1/4 (TUBING) ×1 IMPLANT
YANKAUER SUCT BULB TIP NO VENT (SUCTIONS) IMPLANT

## 2024-01-24 NOTE — Anesthesia Postprocedure Evaluation (Signed)
 Anesthesia Post Note  Patient: Brandi Stevens  Procedure(s) Performed: RADIOACTIVE SEED GUIDED BREAST BIOPSY (Left: Breast)     Patient location during evaluation: PACU Anesthesia Type: General Level of consciousness: awake and alert, oriented and patient cooperative Pain management: pain level controlled Vital Signs Assessment: post-procedure vital signs reviewed and stable Respiratory status: spontaneous breathing, nonlabored ventilation and respiratory function stable Cardiovascular status: blood pressure returned to baseline and stable Postop Assessment: no apparent nausea or vomiting Anesthetic complications: no   No notable events documented.  Last Vitals:  Vitals:   01/24/24 1330 01/24/24 1345  BP: (!) 120/59 (!) 107/58  Pulse: (!) 52 60  Resp: 10 16  Temp:  (!) 36.2 C  SpO2: 100% 100%    Last Pain:  Vitals:   01/24/24 1345  TempSrc:   PainSc: 2                  Almarie CHRISTELLA Marchi

## 2024-01-24 NOTE — H&P (Signed)
 REFERRING PHYSICIAN: Elnor Lauraine BRAVO, NP PROVIDER: DEWARD GARNETTE NULL, MD MRN: [id number] DOB: [birth year] Subjective   Chief Complaint: New Problem (Abnormal mammogram)  History of Present Illness: Brandi Stevens is a 68 y.o. female who is seen today as an office consultation for evaluation of New Problem (Abnormal mammogram)  We are asked to see the patient in consultation by Dr. Lauraine Elnor to evaluate her for a left breast mass. The patient is a 68 year old black female who recently went for a routine screening mammogram. At that time she was found to have an 8 mm mass in the upper inner quadrant of the left breast. The mass had very well-circumscribed edges and on retrospect seems to have been present and stable since 2019 which argues for a benign process. She has no family history of breast cancer. She does have COPD and still smokes some.  Review of Systems: A complete review of systems was obtained from the patient. I have reviewed this information and discussed as appropriate with the patient. See HPI as well for other ROS.  ROS   Medical History: Past Medical History:  Diagnosis Date  Arthritis   Patient Active Problem List  Diagnosis  Abdominal pain, LLQ  Allergic conjunctivitis and rhinitis  Allergic conjunctivitis of both eyes  COVID-19 virus detected  Family history of colon cancer  Family history of prostate cancer  FHx: pancreatic cancer  Fibroadenoma of right breast  Graves disease  Neutropenia  Osteoporosis  Other emphysema (CMS/HHS-HCC)  Prediabetes  Rotator cuff arthropathy  Sinus bradycardia  Encounter for general adult medical examination with abnormal findings  Submandibular lymphadenopathy  Vitamin D  deficiency  Mass of upper inner quadrant of left breast   Past Surgical History:  Procedure Laterality Date  CHOLECYSTECTOMY 1989  HYSTERECTOMY 1989  Breast Surgery 2014    Allergies  Allergen Reactions  Ibandronic Acid Diarrhea and Nausea And  Vomiting  Latex Anaphylaxis and Rash  Latex gloves/ skin itch  Sulfa (Sulfonamide Antibiotics) Unknown   Current Outpatient Medications on File Prior to Visit  Medication Sig Dispense Refill  cholecalciferol (VITAMIN D3) 1000 unit tablet Take by mouth  fluticasone  propionate (FLONASE ) 50 mcg/actuation nasal spray Place into one nostril  linaCLOtide  (LINZESS ) 145 mcg capsule Take by mouth  multivitamin (MULTIPLE VITAMINS) tablet Take 1 tablet by mouth once daily  triamcinolone  0.025 % ointment Apply 1 Application topically 2 (two) times daily. Use twice daily for 1 - 2 weeks as needed for skin irritation and itching.   No current facility-administered medications on file prior to visit.   History reviewed. No pertinent family history.   Social History   Tobacco Use  Smoking Status Former  Types: Cigarettes  Smokeless Tobacco Never    Social History   Socioeconomic History  Marital status: Divorced  Tobacco Use  Smoking status: Former  Types: Cigarettes  Smokeless tobacco: Never  Vaping Use  Vaping status: Unknown  Substance and Sexual Activity  Alcohol use: Not Currently  Drug use: Never  Sexual activity: Defer   Social Drivers of Health   Financial Resource Strain: Low Risk (11/10/2023)  Received from [facility]  Overall Financial Resource Strain (CARDIA)  How hard is it for you to pay for the very basics like food, housing, medical care, and heating?: Not hard at all  Food Insecurity: No Food Insecurity (11/10/2023)  Received from [facility]  Hunger Vital Sign  Within the past 12 months, you worried that your food would run out before you got the  money to buy more.: Never true  Within the past 12 months, the food you bought just didn't last and you didn't have money to get more.: Never true  Transportation Needs: No Transportation Needs (11/10/2023)  Received from [facility] - Transportation  In the past 12 months, has lack of transportation kept you from  medical appointments or from getting medications?: No  In the past 12 months, has lack of transportation kept you from meetings, work, or from getting things needed for daily living?: No  Physical Activity: Sufficiently Active (11/10/2023)  Received from [facility]  Exercise Vital Sign  On average, how many days per week do you engage in moderate to strenuous exercise (like a brisk walk)?: 5 days  On average, how many minutes do you engage in exercise at this level?: 30 min  Stress: No Stress Concern Present (11/10/2023)  Received from [facility] - Occupational Stress Questionnaire  Do you feel stress - tense, restless, nervous, or anxious, or unable to sleep at night because your mind is troubled all the time - these days?: Not at all  Social Connections: Moderately Integrated (11/10/2023)  Received from [facility]  Social Connection and Isolation Panel  In a typical week, how many times do you talk on the phone with family, friends, or neighbors?: More than three times a week  How often do you get together with friends or relatives?: Once a week  How often do you attend church or religious services?: More than 4 times per year  Do you belong to any clubs or organizations such as church groups, unions, fraternal or athletic groups, or school groups?: Yes  How often do you attend meetings of the clubs or organizations you belong to?: More than 4 times per year  Are you married, widowed, divorced, separated, never married, or living with a partner?: Divorced  Housing Stability: Unknown (12/28/2023)  Housing Stability Vital Sign  Homeless in the Last Year: No   Objective:   Vitals:  BP: 124/59  Pulse: 51  Temp: 36.5 C (97.7 F)  TempSrc: Temporal  SpO2: 95%  Weight: 59.7 kg (131 lb 9.6 oz)  Height: 165.1 cm (5' 5)  PainSc: 0-No pain   Body mass index is 21.9 kg/m.  Physical Exam Vitals reviewed.  Constitutional:  General: She is not in  acute distress. Appearance: Normal appearance.  HENT:  Head: Normocephalic and atraumatic.  Comments: She has swollen submandibular glands. Right Ear: External ear normal.  Left Ear: External ear normal.  Nose: Nose normal.  Mouth/Throat:  Mouth: Mucous membranes are moist.  Pharynx: Oropharynx is clear.  Eyes:  General: No scleral icterus. Extraocular Movements: Extraocular movements intact.  Conjunctiva/sclera: Conjunctivae normal.  Pupils: Pupils are equal, round, and reactive to light.  Cardiovascular:  Rate and Rhythm: Normal rate and regular rhythm.  Pulses: Normal pulses.  Heart sounds: Normal heart sounds.  Pulmonary:  Effort: Pulmonary effort is normal. No respiratory distress.  Breath sounds: Normal breath sounds.  Abdominal:  General: Bowel sounds are normal.  Palpations: Abdomen is soft.  Tenderness: There is no abdominal tenderness.  Musculoskeletal:  General: No swelling, tenderness or deformity. Normal range of motion.  Cervical back: Normal range of motion and neck supple.  Skin: General: Skin is warm and dry.  Coloration: Skin is not jaundiced.  Neurological:  General: No focal deficit present.  Mental Status: She is alert and oriented to person, place, and time.  Psychiatric:  Mood  and Affect: Mood normal.  Behavior: Behavior normal.     Breast: There is no palpable mass in either breast. There is no palpable axillary, supraclavicular, or cervical lymphadenopathy.  Labs, Imaging and Diagnostic Testing:  Assessment and Plan:   Diagnoses and all orders for this visit:  Mass of upper inner quadrant of left breast - [facility] Case Posting Request; Future   The patient appears to have what looks like a benign mass in the upper inner quadrant of the left breast. The presence of the mass concerns her and she would like to have it removed which is not unreasonable. I have discussed with her in detail the risks and benefits of the operation as well as some of  the technical aspects including use of a radioactive seed for localization and she understands and wishes to proceed. We will move forward with surgical scheduling. She will see ENT to evaluate her submandibular glands

## 2024-01-24 NOTE — Op Note (Signed)
 01/24/2024  1:10 PM  PATIENT:  Brandi Stevens  68 y.o. female  PRE-OPERATIVE DIAGNOSIS:  LEFT BREAST MASS  POST-OPERATIVE DIAGNOSIS:  LEFT BREAST MASS  PROCEDURE:  Procedure(s): RADIOACTIVE SEED GUIDED EXCISIONAL LEFT BREAST BIOPSY (Left)  SURGEON:  Surgeons and Role:    DEWAINE Curvin Deward DOUGLAS, MD - Primary  PHYSICIAN ASSISTANT:   ASSISTANTS: none   ANESTHESIA:   local and general  EBL:  10 mL   BLOOD ADMINISTERED:none  DRAINS: none   LOCAL MEDICATIONS USED:  MARCAINE      SPECIMEN:  Source of Specimen:  left breast tissue  DISPOSITION OF SPECIMEN:  PATHOLOGY  COUNTS:  YES  TOURNIQUET:  * No tourniquets in log *  DICTATION: .Dragon Dictation  After informed consent was obtained the patient was brought to the operating room and placed in the supine position on the operating table.  After adequate induction of general anesthesia the patient's left breast was prepped with ChloraPrep, allowed to dry, and draped in usual sterile manner.  An appropriate timeout was performed.  Previously an I-125 seed was placed in the upper inner left breast to mark an area of mass that has been observed for several years.  The neoprobe was set to I-125 and the area of radioactivity was readily identified.  The area around this was infiltrated with quarter percent Marcaine .  A curvilinear incision was made along the upper edge of the areola of the left breast with a 15 blade knife.  The incision was carried through the skin and subcutaneous tissue sharply with the electrocautery.  Dissection was then carried out through the upper and upper inner area of the left breast between the breast tissue and the subcutaneous fat and skin sharply with the electrocautery.  Once the dissection was beyond the area of the mass then I removed a circular portion of breast tissue sharply with the electrocautery around the radioactive seed while checking the area of radioactivity frequently.  Once the tissue was removed it  was oriented with the appropriate paint colors.  A specimen radiograph was obtained that showed the seed to be near the center of the specimen.  The specimen was then sent to pathology for further evaluation.  Hemostasis was achieved using the Bovie electrocautery.  The wound was irrigated with saline and infiltrated with more quarter percent Marcaine .  The deep layer of the incision was closed with interrupted 3-0 Vicryl stitches.  The skin was then closed with interrupted 4-0 Monocryl subcuticular stitches.  Dermabond dressings were applied.  The patient tolerated the procedure well.  At the end of the case all needle sponge and instrument counts were correct.  The patient was then awakened and taken to recovery in stable condition.  PLAN OF CARE: Discharge to home after PACU  PATIENT DISPOSITION:  PACU - hemodynamically stable.   Delay start of Pharmacological VTE agent (>24hrs) due to surgical blood loss or risk of bleeding: not applicable

## 2024-01-24 NOTE — Transfer of Care (Signed)
 Immediate Anesthesia Transfer of Care Note  Patient: Brandi Stevens  Procedure(s) Performed: RADIOACTIVE SEED GUIDED BREAST BIOPSY (Left: Breast)  Patient Location: PACU  Anesthesia Type:General  Level of Consciousness: awake, alert , and oriented  Airway & Oxygen Therapy: Patient Spontanous Breathing and Patient connected to nasal cannula oxygen  Post-op Assessment: Report given to RN and Post -op Vital signs reviewed and stable  Post vital signs: Reviewed and stable  Last Vitals:  Vitals Value Taken Time  BP    Temp    Pulse 65 01/24/24 13:19  Resp 14 01/24/24 13:19  SpO2 100 % 01/24/24 13:19  Vitals shown include unfiled device data.  Last Pain:  Vitals:   01/24/24 1057  TempSrc: Temporal  PainSc: 0-No pain      Patients Stated Pain Goal: 3 (01/24/24 1057)  Complications: No notable events documented.

## 2024-01-24 NOTE — Interval H&P Note (Signed)
 History and Physical Interval Note:  01/24/2024 11:54 AM  Brandi Stevens  has presented today for surgery, with the diagnosis of LEFT BREAST MASS.  The various methods of treatment have been discussed with the patient and family. After consideration of risks, benefits and other options for treatment, the patient has consented to  Procedure(s): RADIOACTIVE SEED GUIDED BREAST BIOPSY (Left) as a surgical intervention.  The patient's history has been reviewed, patient examined, no change in status, stable for surgery.  I have reviewed the patient's chart and labs.  Questions were answered to the patient's satisfaction.     [name]

## 2024-01-24 NOTE — Anesthesia Preprocedure Evaluation (Addendum)
 Anesthesia Evaluation  Patient identified by MRN, date of birth, ID band Patient awake    Reviewed: Allergy & Precautions, NPO status , Patient's Chart, lab work & pertinent test results  History of Anesthesia Complications (+) PONV and history of anesthetic complications  Airway Mallampati: I  TM Distance: >3 FB Neck ROM: Full    Dental no notable dental hx. (+) Teeth Intact, Dental Advisory Given   Pulmonary COPD, Current Smoker Current smoker, 50 pack year history  3-4cigg/d, none today  No inhalers   Pulmonary exam normal breath sounds clear to auscultation       Cardiovascular negative cardio ROS Normal cardiovascular exam Rhythm:Regular Rate:Normal     Neuro/Psych negative neurological ROS  negative psych ROS   GI/Hepatic negative GI ROS, Neg liver ROS,,,  Endo/Other  negative endocrine ROS    Renal/GU negative Renal ROS  negative genitourinary   Musculoskeletal  (+) Arthritis , Osteoarthritis,    Abdominal   Peds  Hematology negative hematology ROS (+)   Anesthesia Other Findings   Reproductive/Obstetrics negative OB ROS                              Anesthesia Physical Anesthesia Plan  ASA: 3  Anesthesia Plan: General   Post-op Pain Management: Tylenol  PO (pre-op)*   Induction: Intravenous  PONV Risk Score and Plan: 2 and Ondansetron , Dexamethasone, Midazolam  and Treatment may vary due to age or medical condition  Airway Management Planned: LMA  Additional Equipment:   Intra-op Plan:   Post-operative Plan: Extubation in OR  Informed Consent: I have reviewed the patients History and Physical, chart, labs and discussed the procedure including the risks, benefits and alternatives for the proposed anesthesia with the patient or authorized representative who has indicated his/her understanding and acceptance.     Dental advisory given  Plan Discussed with:  CRNA  Anesthesia Plan Comments:          Anesthesia Quick Evaluation

## 2024-01-24 NOTE — Discharge Instructions (Addendum)
  Post Anesthesia Home Care Instructions  Activity: Get plenty of rest for the remainder of the day. A responsible individual must stay with you for 24 hours following the procedure.  For the next 24 hours, DO NOT: -Drive a car -Advertising copywriter -Drink alcoholic beverages -Take any medication unless instructed by your physician -Make any legal decisions or sign important papers.  Meals: Start with liquid foods such as gelatin or soup. Progress to regular foods as tolerated. Avoid greasy, spicy, heavy foods. If nausea and/or vomiting occur, drink only clear liquids until the nausea and/or vomiting subsides. Call your physician if vomiting continues.  Special Instructions/Symptoms: Your throat may feel dry or sore from the anesthesia or the breathing tube placed in your throat during surgery. If this causes discomfort, gargle with warm salt water. The discomfort should disappear within 24 hours.  Tylenol  can be taken 5pm if needed

## 2024-01-24 NOTE — Anesthesia Procedure Notes (Signed)
 Procedure Name: LMA Insertion Date/Time: 01/24/2024 12:33 PM  Performed by: Julieanne Fairy BROCKS, CRNAPre-anesthesia Checklist: Patient identified, Emergency Drugs available, Suction available and Patient being monitored Patient Re-evaluated:Patient Re-evaluated prior to induction Oxygen Delivery Method: Circle system utilized Preoxygenation: Pre-oxygenation with 100% oxygen Induction Type: IV induction Ventilation: Mask ventilation without difficulty LMA: LMA inserted LMA Size: 4.0 Number of attempts: 1 Airway Equipment and Method: Bite block Placement Confirmation: positive ETCO2 Tube secured with: Tape Dental Injury: Teeth and Oropharynx as per pre-operative assessment

## 2024-01-25 ENCOUNTER — Encounter (HOSPITAL_BASED_OUTPATIENT_CLINIC_OR_DEPARTMENT_OTHER): Payer: Self-pay | Admitting: General Surgery

## 2024-01-25 LAB — HIV ANTIBODY (ROUTINE TESTING W REFLEX)
HIV 1&2 Ab, 4th Generation: NONREACTIVE
HIV FINAL INTERPRETATION: NEGATIVE

## 2024-01-25 LAB — HSV 2 INHIBITION: HSV 2 IGG INHIBITION,IA: POSITIVE — AB

## 2024-01-25 LAB — HERPES SIMPLEX VIRUS 1 AND 2 (IGG),REFLEX HSV-2 INHIBITION
HSV 1 IGG,TYPE SPECIFIC AB: 50.7 {index} — ABNORMAL HIGH
HSV 2 IGG,TYPE SPECIFIC AB: 4.66 {index} — ABNORMAL HIGH

## 2024-01-25 LAB — RPR: RPR Ser Ql: NONREACTIVE

## 2024-01-25 LAB — SURGICAL PATHOLOGY

## 2024-01-25 LAB — HEPATITIS C ANTIBODY: Hepatitis C Ab: NONREACTIVE

## 2024-01-26 ENCOUNTER — Ambulatory Visit: Payer: Self-pay | Admitting: General Surgery

## 2024-01-26 LAB — SURGICAL PATHOLOGY

## 2024-01-31 ENCOUNTER — Ambulatory Visit
Admission: RE | Admit: 2024-01-31 | Discharge: 2024-01-31 | Disposition: A | Discharge status: Home / Self Care | Source: Ambulatory Visit | Attending: Acute Care | Admitting: Acute Care

## 2024-01-31 DIAGNOSIS — Z87891 Personal history of nicotine dependence: Secondary | ICD-10-CM | POA: Diagnosis not present

## 2024-01-31 DIAGNOSIS — Z122 Encounter for screening for malignant neoplasm of respiratory organs: Secondary | ICD-10-CM

## 2024-02-08 ENCOUNTER — Other Ambulatory Visit: Payer: Self-pay | Admitting: Acute Care

## 2024-02-08 DIAGNOSIS — Z87891 Personal history of nicotine dependence: Secondary | ICD-10-CM

## 2024-02-08 DIAGNOSIS — Z122 Encounter for screening for malignant neoplasm of respiratory organs: Secondary | ICD-10-CM

## 2024-02-10 ENCOUNTER — Ambulatory Visit (INDEPENDENT_AMBULATORY_CARE_PROVIDER_SITE_OTHER): Admitting: Otolaryngology

## 2024-02-10 ENCOUNTER — Encounter (INDEPENDENT_AMBULATORY_CARE_PROVIDER_SITE_OTHER): Payer: Self-pay | Admitting: Otolaryngology

## 2024-02-10 VITALS — BP 113/62 | HR 58 | Ht 65.0 in | Wt 131.0 lb

## 2024-02-10 DIAGNOSIS — F17200 Nicotine dependence, unspecified, uncomplicated: Secondary | ICD-10-CM

## 2024-02-10 DIAGNOSIS — R590 Localized enlarged lymph nodes: Secondary | ICD-10-CM | POA: Diagnosis not present

## 2024-02-10 DIAGNOSIS — Z720 Tobacco use: Secondary | ICD-10-CM

## 2024-02-10 NOTE — Progress Notes (Signed)
 Dear Dr. Elnor, Here is my assessment for our mutual patient, Brandi Stevens. Thank you for allowing me the opportunity to care for your patient. Please do not hesitate to contact me should you have any other questions. Sincerely, Dr. Eldora Blanch  Otolaryngology Clinic Note Referring provider: Dr. Elnor HPI:  Initial visit (12/2023): Initially saw Dr. Soldatova with her notes summarized below. She reports she has noticed right submandibular gland swelling/lymph nodes at least since 2008, and has been checked periodically. Sometimes has some discomfort, but this is rare. She has gotten CT and US  for it, including multiple. She saw my partner Dr. Soldatova who had an US . She has had antibiotics recently but did not change in size. No B symptoms.  Patient otherwise denies: - dysphagia, otalgia, odynophagia, unintentional weight loss - changes in voice, shortness of breath, hemoptysis  She sees Dr. Luciano for her ears.   She is interested in removal of her lymph nodes because of significant concerns from personal standpoint regarding cancer  Denies prior biopsy of these LN Of note, she denies any frequent episodes of Submandibular or parotid sialadenitits --------------------------------------------------------- 02/10/2024 Seen in follow up. We discussed the biopsy results. She is still worried about cancer and wishes to have the lymph node excised. We discussed R/B/A. She reports that sometimes the area still aches mildly; No B symptoms. No B symptoms.  Patient otherwise denies: - dysphagia, odynophagia, unintentional weight loss - changes in voice, shortness of breath, hemoptysis  ENT Surgery: no Personal or FHx of bleeding dz or anesthesia difficulty: no  GLP-1: no AP/AC: no  Tobacco: current  PMHx: Graves disease, [place]  Independent Review of Additional Tests or Records:  Dr. Soldatova (12/02/2023): She has had a neck lump since 2008, which has remained stable in size over the  years. Multiple imaging studies, including a CT scan in 2020 and an ultrasound in 2023, confirmed the stability of the lump. The patient recalls being told that the lump was called lymph nodes after an ultrasound in 2009. No associated symptoms such as fevers, chills, or weight loss.  TFL without masses; Dx: R neck LAD - rec US  CBC 07/12/2023: WBC 4.1 US  12/27/2023 independently interpreted and agree with read - noted mlutiple LN submandibular area -- with two other small LN without normal hilum and thickening -- increased in size compared to prior US  04/24/2021: noted b/l submandibular LN, with effacement of hilar fat 12x8 mm, size similar to CT CT Neck 11/17/2018 interpreted: noted fairly prominent SMG glands with bilateral submandibular LN (more rounded) right > left. Bilateral parotid and SMG ducts seem enlarged Bx (01/20/2024): No B or T cell abnormal population noted; limited lymphoid tissue but no clonal B cell population, favor reactive.  PMH/Meds/All/SocHx/FamHx/ROS:   Past Medical History:  Diagnosis Date   Arthritis    Bilateral shoulders.   Bradycardia    Exercise stress test normal 2008   Cyst of ear canal 10/08/2022   Diverticulitis 2012   Emphysema    Last PFTs 1980s, not on current inhaler thearpy   Fibroadenoma of breast 01/2012   R breast   Graves disease 1980s   RAI in 2008, not on any thyroid  replacement hormone   HSV-2 infection 2025   Neutropenia 05/16/2021   Osteoarthritis of shoulder    Bilateral arthritis/bursitis   PONV (postoperative nausea and vomiting)    Rotator cuff tear arthropathy    R cuff   Seasonal allergies    SVD (spontaneous vaginal delivery)    x 3  Tubular adenoma of colon 01/2015     Past Surgical History:  Procedure Laterality Date   ABDOMINAL HYSTERECTOMY  1989   BREAST BIOPSY  12/22/2011   Procedure: BREAST BIOPSY;  Surgeon: Lynda Leos, MD;  Location: [facility] OR;  Service: General;  Laterality: Right;  Excision right breast mass    BREAST BIOPSY Left 01/21/2024   US  LT RADIOACTIVE SEED LOC 01/21/2024 [facility]   BREAST EXCISIONAL BIOPSY     CHOLECYSTECTOMY  1989   COLONOSCOPY  01/2015   hx polyps-[facility]   COLONOSCOPY  2019   MS-MAC-prep good (goly)-TA- repeat 3 yrs   EYE SURGERY Left    MULTIPLE TOOTH EXTRACTIONS     POLYPECTOMY  2019   TA   RADIOACTIVE SEED GUIDED BREAST BIOPSY Left 01/24/2024   Procedure: RADIOACTIVE SEED GUIDED BREAST BIOPSY;  Surgeon: Curvin Deward MOULD, MD;  Location: [facility];  Service: General;  Laterality: Left;   ROTATOR CUFF REPAIR     R shoulder x2 Dr. Dow at [facility]   ROTATOR CUFF REPAIR Right    SHOULDER ARTHROSCOPY W/ SUPERIOR LABRAL ANTERIOR POSTERIOR LESION REPAIR Right    bursitis   TUBAL LIGATION      Family History  Problem Relation Age of Onset   Colon polyps Mother 73   Pancreatic cancer Mother 35       pancreatic   Hearing loss Father    Stroke Father 90       ischemic stroke   Prostate cancer Brother 79       with mets   Gastric cancer Maternal Aunt        gastric   Colon polyps Maternal Grandfather 95   Colon cancer Maternal Grandfather 65   Rectal cancer Neg Hx    Stomach cancer Neg Hx    Esophageal cancer Neg Hx      Social Connections: Moderately Integrated (11/10/2023)   Social Connection and Isolation Panel    Frequency of Communication with Friends and Family: More than three times a week    Frequency of Social Gatherings with Friends and Family: Once a week    Attends Religious Services: More than 4 times per year    Active Member of [employer] or Organizations: Yes    Attends Engineer, Structural: More than 4 times per year    Marital Status: Divorced      Current Outpatient Medications:    acetaminophen  (TYLENOL ) 650 MG CR tablet, Take 650 mg by mouth every 8 (eight) hours as needed for pain., Disp: , Rfl:    cetirizine (ZYRTEC) 10 MG chewable tablet, Chew 10 mg by mouth daily as needed., Disp: , Rfl:     cholecalciferol (VITAMIN D ) 1000 UNITS tablet, Take 5,000 Units by mouth 3 (three) times a week., Disp: , Rfl:    diclofenac  sodium (VOLTAREN ) 1 % GEL, Apply 2 g topically daily as needed. For shoulder pain per patient, Disp: 6 Tube, Rfl: 1   fluticasone  (FLONASE ) 50 MCG/ACT nasal spray, Place 1 spray into both nostrils daily., Disp: 16 g, Rfl: 5   gabapentin  (NEURONTIN ) 100 MG capsule, Take 1 capsule (100 mg total) by mouth at bedtime., Disp: 30 capsule, Rfl: 2   levocetirizine (XYZAL ) 5 MG tablet, Take 0.5 tablets (2.5 mg total) by mouth every evening., Disp: 90 tablet, Rfl: 1   linaclotide  (LINZESS ) 145 MCG CAPS capsule, Take 1 capsule (145 mcg total) by mouth daily as needed., Disp: 30 capsule, Rfl: 2   Multiple Vitamins-Minerals (  CENTRUM SILVER PO), Take 1 tablet by mouth daily., Disp: , Rfl:    Olopatadine  HCl 0.2 % SOLN, Apply 1 drop to eye in the morning., Disp: 2.5 mL, Rfl: 2   oxyCODONE  (ROXICODONE ) 5 MG immediate release tablet, Take 1 tablet (5 mg total) by mouth every 6 (six) hours as needed., Disp: 10 tablet, Rfl: 0   polyethylene glycol powder (GLYCOLAX /MIRALAX ) 17 GM/SCOOP powder, Take 17 g by mouth 2 (two) times daily as needed., Disp: 225 g, Rfl: 5   triamcinolone  (KENALOG ) 0.025 % ointment, Apply 1 Application topically 2 (two) times daily. Use twice daily for 1 - 2 weeks as needed for skin irritation and itching., Disp: 30 g, Rfl: 1   Physical Exam:   BP 113/62 (BP Location: Left Arm, Patient Position: Sitting, Cuff Size: Normal)   Pulse (!) 58   Ht 5' 5 (1.651 m)   Wt 131 lb (59.4 kg)   SpO2 (!) 85%   BMI 21.80 kg/m   Salient findings:  CN II-XII intact Bilateral EAC clear and TM intact with well pneumatized middle ear spaces Anterior rhinoscopy: Septum intact; bilateral inferior turbinates without significant hypertrophy No lesions of oral cavity/oropharynx; easily expressible saliva both submandibular ducts No obviously palpable neck  masses/lymphadenopathy/thyromegaly EXCEPT - mass she points to appears to be b/l ptotic submandibular gland, small lymph node (~1x1 cm) adjacent, mobile, easily palpable. No respiratory distress or stridor  Seprately Identifiable Procedures:  Prior to initiating any procedures, risks/benefits/alternatives were explained to the patient and verbal consent obtained. None today - offered TFL again today, deferred Impression & Plans:  Brandi Stevens is a 68 y.o. female with:  1. Cervical lymphadenopathy   2. Tobacco use disorder    Noted b/l SMG LAD at least since 2015, most recent US  showing some size change in patient with h/o smoking. Offered but deferred TFL again Core Bx without lymphoma but no noted possible inadequate sample. Discussed options including serial imaging v/s bx. She strongly wishes for excisional bx  We also discussed R/B/A for surgery, including pain, bleeding, infection, numbness, risks to major vascular structures and life threatening bleed, tongue or lip weakness, chyle leak, injury to vagus or phrenic nerve, change in speech or swallowing, need for further procedures or treatment. We also discussed post-op management and expectations including possible need for drain. She understands all of this and is anxious to proceed.  Will get CT Neck prior to ensure no other gross masses or worrisome areas.  See below regarding exact medications prescribed this encounter including dosages and route: No orders of the defined types were placed in this encounter.     Thank you for allowing me the opportunity to care for your patient. Please do not hesitate to contact me should you have any other questions.  Sincerely, Eldora Blanch, MD Otolaryngologist (ENT), [facility] Phone: [phone] Fax: [phone]  02/10/2024, 8:13 AM   I have personally spent 30 minutes involved in face-to-face and non-face-to-face activities for this patient on the day of the  visit.  Professional time spent excludes any procedures performed but includes the following activities, in addition to those noted in the documentation: preparing to see the patient (review of outside documentation and results), performing a medically appropriate examination, counseling, documenting in the electronic health record

## 2024-02-14 DIAGNOSIS — R590 Localized enlarged lymph nodes: Secondary | ICD-10-CM | POA: Diagnosis not present

## 2024-02-14 DIAGNOSIS — H6123 Impacted cerumen, bilateral: Secondary | ICD-10-CM | POA: Diagnosis not present

## 2024-02-14 DIAGNOSIS — Q181 Preauricular sinus and cyst: Secondary | ICD-10-CM | POA: Diagnosis not present

## 2024-02-17 ENCOUNTER — Other Ambulatory Visit

## 2024-02-17 ENCOUNTER — Encounter

## 2024-02-24 ENCOUNTER — Encounter

## 2024-02-24 ENCOUNTER — Other Ambulatory Visit

## 2024-02-29 ENCOUNTER — Ambulatory Visit (HOSPITAL_COMMUNITY)
Admission: RE | Admit: 2024-02-29 | Discharge: 2024-02-29 | Disposition: A | Discharge status: Home / Self Care | Source: Ambulatory Visit | Attending: Otolaryngology | Admitting: Otolaryngology

## 2024-02-29 DIAGNOSIS — R590 Localized enlarged lymph nodes: Secondary | ICD-10-CM | POA: Insufficient documentation

## 2024-02-29 MED ORDER — IOHEXOL 300 MG/ML  SOLN
75.0000 mL | Freq: Once | INTRAMUSCULAR | Status: AC | PRN
  Administered 2024-02-29: 75 mL via INTRAVENOUS

## 2024-03-22 ENCOUNTER — Other Ambulatory Visit: Payer: Self-pay | Admitting: Nurse Practitioner

## 2024-03-22 DIAGNOSIS — Z1231 Encounter for screening mammogram for malignant neoplasm of breast: Secondary | ICD-10-CM

## 2024-03-29 ENCOUNTER — Telehealth: Payer: Self-pay

## 2024-03-29 NOTE — Telephone Encounter (Signed)
 Copied from CRM #[id number]. Topic: Clinical - Medication Question >> Mar 29, 2024 12:37 PM Vena HERO wrote: Reason for CRM: Pt called to request medication for a head cold sent to pharmacy on file. Took a home test on Saturday for covid, flu and rsv when symptoms started and it came back negative. Has been having runny nose and sneezing and is worried about it progressing. Please call pt to advise

## 2024-03-31 ENCOUNTER — Ambulatory Visit: Admitting: Family Medicine

## 2024-03-31 ENCOUNTER — Encounter: Payer: Self-pay | Admitting: Family Medicine

## 2024-03-31 VITALS — BP 118/62 | HR 63 | Temp 98.9°F | Ht 65.0 in | Wt 132.0 lb

## 2024-03-31 DIAGNOSIS — B9689 Other specified bacterial agents as the cause of diseases classified elsewhere: Secondary | ICD-10-CM

## 2024-03-31 DIAGNOSIS — J329 Chronic sinusitis, unspecified: Secondary | ICD-10-CM

## 2024-03-31 DIAGNOSIS — R051 Acute cough: Secondary | ICD-10-CM | POA: Diagnosis not present

## 2024-03-31 DIAGNOSIS — J069 Acute upper respiratory infection, unspecified: Secondary | ICD-10-CM

## 2024-03-31 LAB — POCT INFLUENZA A/B
Influenza A, POC: NEGATIVE
Influenza B, POC: NEGATIVE

## 2024-03-31 LAB — POC COVID19 BINAXNOW: SARS Coronavirus 2 Ag: NEGATIVE

## 2024-03-31 MED ORDER — AMOXICILLIN-POT CLAVULANATE 875-125 MG PO TABS: 1.0000 | ORAL_TABLET | Freq: Two times a day (BID) | ORAL | 0 refills | Status: AC

## 2024-03-31 NOTE — Patient Instructions (Signed)
 I have sent in Augmentin for you to take twice a day for 10 days.  This medication can upset your stomach, so I tell everyone to take it with a meal.  Follow-up with me for new or worsening symptoms.

## 2024-03-31 NOTE — Progress Notes (Signed)
 "  Acute Office Visit  Subjective:     Patient ID: Brandi Stevens, female    DOB: 1955-10-09, 69 y.o.   MRN: [id number]  Chief Complaint  Patient presents with   Nasal Congestion    Congestion cough runny and itchy eyes and throat  since 1/17    HPI  Discussed the use of AI scribe software for clinical note transcription with the patient, who gave verbal consent to proceed.  History of Present Illness Brandi Stevens is a 69 year old female who presents with symptoms of an upper respiratory and sinus infection.  Upper respiratory and sinus symptoms - Onset last Friday with rhinorrhea and scratchy, mildly itchy throat - Progression by Monday to increased mucus production: clear during the day, thick and glue-like at night, causing nocturnal awakening around 2 AM - Ear discomfort present - Occasional minor epistaxis associated with forceful nose blowing - Current mucus is milky with some yellow-green tint - No true fever or gastrointestinal symptoms - History of recurrent sinus problems leading to prior upper respiratory infections  Self-management and response to treatment - Using Mucinex and severe sinus medication since Monday, which improves mucus during the day but not at night - Has a humidifier but has not used it regularly  Infectious disease testing - Negative home COVID-19 and three-in-one flu testing performed yesterday  Medication tolerance - Tolerates penicillins and amoxicillin  - Does not tolerate sulfa drugs  Surgical considerations - Concerned about symptoms due to upcoming throat surgery scheduled for February 11th for noncancerous throat growths and enlarged esophagus     ROS Per HPI      Objective:    BP 118/62   Pulse 63   Temp 98.9 F (37.2 C) (Temporal)   Ht 5' 5 (1.651 m)   Wt 132 lb (59.9 kg)   SpO2 98%   BMI 21.97 kg/m    Physical Exam Vitals and nursing note reviewed.  Constitutional:      General: She is not in acute  distress.    Appearance: She is normal weight.     Comments: Appears fatigued  HENT:     Head: Normocephalic and atraumatic.     Right Ear: External ear normal.     Left Ear: External ear normal.     Nose: Congestion present.     Right Sinus: Maxillary sinus tenderness and frontal sinus tenderness present.     Left Sinus: Maxillary sinus tenderness and frontal sinus tenderness present.     Mouth/Throat:     Mouth: Mucous membranes are moist.     Comments: Oropharyngeal cobblestoning   Eyes:     Extraocular Movements: Extraocular movements intact.     Pupils: Pupils are equal, round, and reactive to light.  Cardiovascular:     Rate and Rhythm: Normal rate and regular rhythm.     Pulses: Normal pulses.     Heart sounds: Normal heart sounds.  Pulmonary:     Effort: Pulmonary effort is normal. No respiratory distress.     Breath sounds: Normal breath sounds. No wheezing, rhonchi or rales.  Musculoskeletal:        General: Normal range of motion.     Cervical back: Normal range of motion.     Right lower leg: No edema.     Left lower leg: No edema.  Lymphadenopathy:     Cervical: Cervical adenopathy present.  Neurological:     General: No focal deficit present.     Mental Status: She  is alert and oriented to person, place, and time.  Psychiatric:        Mood and Affect: Mood normal.        Thought Content: Thought content normal.     Results for orders placed or performed in visit on 03/31/24  POC COVID-19 BinaxNow  Result Value Ref Range   SARS Coronavirus 2 Ag Negative Negative  POCT Influenza A/B  Result Value Ref Range   Influenza A, POC Negative Negative   Influenza B, POC Negative Negative        Assessment & Plan:   Assessment and Plan Assessment & Plan Acute upper respiratory infection with acute bacterial sinusitis Symptoms suggest bacterial sinusitis due to duration and purulent discharge. Negative COVID test, flu test negative. - Prescribed Augmentin   for 10 days. - Advised humidifier use for nasal dryness. - Continue Mucinex for 24 hours, then discontinue.     Orders Placed This Encounter  Procedures   POC COVID-19 BinaxNow   POCT Influenza A/B     Meds ordered this encounter  Medications   amoxicillin -clavulanate (AUGMENTIN ) 875-125 MG tablet    Sig: Take 1 tablet by mouth 2 (two) times daily.    Dispense:  20 tablet    Refill:  0    Return if symptoms worsen or fail to improve.  Corean LITTIE Ku, FNP  "

## 2024-04-18 ENCOUNTER — Other Ambulatory Visit (HOSPITAL_COMMUNITY)

## 2024-04-19 ENCOUNTER — Inpatient Hospital Stay (HOSPITAL_COMMUNITY): Admit: 2024-04-19 | Admitting: Otolaryngology

## 2024-04-19 ENCOUNTER — Encounter (HOSPITAL_COMMUNITY): Admission: RE | Payer: Self-pay | Source: Home / Self Care

## 2024-04-25 ENCOUNTER — Ambulatory Visit (INDEPENDENT_AMBULATORY_CARE_PROVIDER_SITE_OTHER): Admitting: Otolaryngology

## 2024-05-18 ENCOUNTER — Ambulatory Visit

## 2024-05-18 ENCOUNTER — Encounter: Admitting: Nurse Practitioner

## 2024-08-21 ENCOUNTER — Encounter: Admitting: Obstetrics and Gynecology

## 2024-09-01 ENCOUNTER — Ambulatory Visit
